# Patient Record
Sex: Male | Born: 1950
Health system: Southern US, Community
[De-identification: ages and names within clinical notes are randomized; demographics above are authoritative.]

## PROBLEM LIST (undated history)

## (undated) DIAGNOSIS — Z923 Personal history of irradiation: Secondary | ICD-10-CM

## (undated) DIAGNOSIS — Z862 Personal history of diseases of the blood and blood-forming organs and certain disorders involving the immune mechanism: Secondary | ICD-10-CM

## (undated) DIAGNOSIS — K08109 Complete loss of teeth, unspecified cause, unspecified class: Secondary | ICD-10-CM

## (undated) DIAGNOSIS — N529 Male erectile dysfunction, unspecified: Secondary | ICD-10-CM

## (undated) DIAGNOSIS — J9690 Respiratory failure, unspecified, unspecified whether with hypoxia or hypercapnia: Secondary | ICD-10-CM

## (undated) DIAGNOSIS — I499 Cardiac arrhythmia, unspecified: Secondary | ICD-10-CM

## (undated) DIAGNOSIS — Z9221 Personal history of antineoplastic chemotherapy: Secondary | ICD-10-CM

## (undated) DIAGNOSIS — N189 Chronic kidney disease, unspecified: Secondary | ICD-10-CM

## (undated) DIAGNOSIS — N401 Enlarged prostate with lower urinary tract symptoms: Secondary | ICD-10-CM

## (undated) DIAGNOSIS — Z7901 Long term (current) use of anticoagulants: Secondary | ICD-10-CM

## (undated) DIAGNOSIS — R7303 Prediabetes: Secondary | ICD-10-CM

## (undated) DIAGNOSIS — I219 Acute myocardial infarction, unspecified: Secondary | ICD-10-CM

## (undated) HISTORY — DX: Respiratory failure, unspecified, unspecified whether with hypoxia or hypercapnia: J96.90

---

## 2019-08-09 ENCOUNTER — Ambulatory Visit (HOSPITAL_COMMUNITY): Admit: 2019-08-09 | Payer: Self-pay | Admitting: Cardiovascular Disease

## 2019-08-09 ENCOUNTER — Encounter (HOSPITAL_COMMUNITY)
Admission: EM | Disposition: A | Discharge status: Home Health | Payer: Self-pay | Source: Home / Self Care | Attending: Cardiovascular Disease

## 2019-08-09 ENCOUNTER — Emergency Department (HOSPITAL_COMMUNITY): Payer: BC Managed Care – PPO

## 2019-08-09 ENCOUNTER — Inpatient Hospital Stay (HOSPITAL_COMMUNITY)
Admission: EM | Admit: 2019-08-09 | Discharge: 2019-08-19 | DRG: 246 | Disposition: A | Discharge status: Home Health | Payer: BC Managed Care – PPO | Attending: Cardiovascular Disease | Admitting: Cardiovascular Disease

## 2019-08-09 DIAGNOSIS — I471 Supraventricular tachycardia: Secondary | ICD-10-CM | POA: Diagnosis not present

## 2019-08-09 DIAGNOSIS — R402112 Coma scale, eyes open, never, at arrival to emergency department: Secondary | ICD-10-CM | POA: Diagnosis not present

## 2019-08-09 DIAGNOSIS — J9601 Acute respiratory failure with hypoxia: Secondary | ICD-10-CM | POA: Diagnosis not present

## 2019-08-09 DIAGNOSIS — K5790 Diverticulosis of intestine, part unspecified, without perforation or abscess without bleeding: Secondary | ICD-10-CM | POA: Diagnosis not present

## 2019-08-09 DIAGNOSIS — R402212 Coma scale, best verbal response, none, at arrival to emergency department: Secondary | ICD-10-CM | POA: Diagnosis not present

## 2019-08-09 DIAGNOSIS — G40901 Epilepsy, unspecified, not intractable, with status epilepticus: Secondary | ICD-10-CM | POA: Diagnosis not present

## 2019-08-09 DIAGNOSIS — I2510 Atherosclerotic heart disease of native coronary artery without angina pectoris: Secondary | ICD-10-CM

## 2019-08-09 DIAGNOSIS — I4901 Ventricular fibrillation: Secondary | ICD-10-CM | POA: Diagnosis not present

## 2019-08-09 DIAGNOSIS — J9690 Respiratory failure, unspecified, unspecified whether with hypoxia or hypercapnia: Secondary | ICD-10-CM | POA: Diagnosis not present

## 2019-08-09 DIAGNOSIS — I4510 Unspecified right bundle-branch block: Secondary | ICD-10-CM | POA: Diagnosis not present

## 2019-08-09 DIAGNOSIS — J690 Pneumonitis due to inhalation of food and vomit: Secondary | ICD-10-CM | POA: Diagnosis not present

## 2019-08-09 DIAGNOSIS — R569 Unspecified convulsions: Secondary | ICD-10-CM | POA: Diagnosis present

## 2019-08-09 DIAGNOSIS — Z20822 Contact with and (suspected) exposure to covid-19: Secondary | ICD-10-CM | POA: Diagnosis not present

## 2019-08-09 DIAGNOSIS — R402312 Coma scale, best motor response, none, at arrival to emergency department: Secondary | ICD-10-CM | POA: Diagnosis not present

## 2019-08-09 DIAGNOSIS — I25110 Atherosclerotic heart disease of native coronary artery with unstable angina pectoris: Secondary | ICD-10-CM | POA: Diagnosis not present

## 2019-08-09 DIAGNOSIS — R310 Gross hematuria: Secondary | ICD-10-CM

## 2019-08-09 DIAGNOSIS — R1110 Vomiting, unspecified: Secondary | ICD-10-CM | POA: Diagnosis not present

## 2019-08-09 DIAGNOSIS — R0683 Snoring: Secondary | ICD-10-CM | POA: Diagnosis not present

## 2019-08-09 DIAGNOSIS — Z6827 Body mass index (BMI) 27.0-27.9, adult: Secondary | ICD-10-CM | POA: Diagnosis not present

## 2019-08-09 DIAGNOSIS — R001 Bradycardia, unspecified: Secondary | ICD-10-CM | POA: Diagnosis not present

## 2019-08-09 DIAGNOSIS — R0602 Shortness of breath: Secondary | ICD-10-CM | POA: Diagnosis not present

## 2019-08-09 DIAGNOSIS — I469 Cardiac arrest, cause unspecified: Secondary | ICD-10-CM | POA: Diagnosis not present

## 2019-08-09 DIAGNOSIS — N3021 Other chronic cystitis with hematuria: Secondary | ICD-10-CM | POA: Diagnosis not present

## 2019-08-09 DIAGNOSIS — I462 Cardiac arrest due to underlying cardiac condition: Secondary | ICD-10-CM | POA: Diagnosis present

## 2019-08-09 DIAGNOSIS — I25119 Atherosclerotic heart disease of native coronary artery with unspecified angina pectoris: Secondary | ICD-10-CM

## 2019-08-09 DIAGNOSIS — N179 Acute kidney failure, unspecified: Secondary | ICD-10-CM | POA: Diagnosis present

## 2019-08-09 DIAGNOSIS — R570 Cardiogenic shock: Secondary | ICD-10-CM | POA: Diagnosis present

## 2019-08-09 DIAGNOSIS — I509 Heart failure, unspecified: Secondary | ICD-10-CM

## 2019-08-09 DIAGNOSIS — Z9861 Coronary angioplasty status: Secondary | ICD-10-CM

## 2019-08-09 DIAGNOSIS — R338 Other retention of urine: Secondary | ICD-10-CM | POA: Diagnosis not present

## 2019-08-09 DIAGNOSIS — F17210 Nicotine dependence, cigarettes, uncomplicated: Secondary | ICD-10-CM | POA: Diagnosis present

## 2019-08-09 DIAGNOSIS — E663 Overweight: Secondary | ICD-10-CM | POA: Diagnosis not present

## 2019-08-09 DIAGNOSIS — Z03818 Encounter for observation for suspected exposure to other biological agents ruled out: Secondary | ICD-10-CM | POA: Diagnosis not present

## 2019-08-09 DIAGNOSIS — R0989 Other specified symptoms and signs involving the circulatory and respiratory systems: Secondary | ICD-10-CM | POA: Diagnosis not present

## 2019-08-09 DIAGNOSIS — N400 Enlarged prostate without lower urinary tract symptoms: Secondary | ICD-10-CM | POA: Diagnosis not present

## 2019-08-09 DIAGNOSIS — N320 Bladder-neck obstruction: Secondary | ICD-10-CM | POA: Diagnosis present

## 2019-08-09 DIAGNOSIS — I214 Non-ST elevation (NSTEMI) myocardial infarction: Secondary | ICD-10-CM | POA: Diagnosis not present

## 2019-08-09 DIAGNOSIS — I517 Cardiomegaly: Secondary | ICD-10-CM | POA: Diagnosis not present

## 2019-08-09 DIAGNOSIS — R972 Elevated prostate specific antigen [PSA]: Secondary | ICD-10-CM | POA: Diagnosis not present

## 2019-08-09 DIAGNOSIS — F1290 Cannabis use, unspecified, uncomplicated: Secondary | ICD-10-CM | POA: Diagnosis present

## 2019-08-09 DIAGNOSIS — Z8679 Personal history of other diseases of the circulatory system: Secondary | ICD-10-CM

## 2019-08-09 DIAGNOSIS — N3289 Other specified disorders of bladder: Secondary | ICD-10-CM | POA: Diagnosis not present

## 2019-08-09 DIAGNOSIS — R9431 Abnormal electrocardiogram [ECG] [EKG]: Secondary | ICD-10-CM | POA: Diagnosis not present

## 2019-08-09 DIAGNOSIS — Z8249 Family history of ischemic heart disease and other diseases of the circulatory system: Secondary | ICD-10-CM

## 2019-08-09 DIAGNOSIS — Z7901 Long term (current) use of anticoagulants: Secondary | ICD-10-CM | POA: Diagnosis not present

## 2019-08-09 DIAGNOSIS — B9620 Unspecified Escherichia coli [E. coli] as the cause of diseases classified elsewhere: Secondary | ICD-10-CM | POA: Diagnosis present

## 2019-08-09 DIAGNOSIS — I25810 Atherosclerosis of coronary artery bypass graft(s) without angina pectoris: Secondary | ICD-10-CM | POA: Diagnosis not present

## 2019-08-09 DIAGNOSIS — I441 Atrioventricular block, second degree: Secondary | ICD-10-CM | POA: Diagnosis not present

## 2019-08-09 DIAGNOSIS — R739 Hyperglycemia, unspecified: Secondary | ICD-10-CM | POA: Diagnosis present

## 2019-08-09 DIAGNOSIS — Z955 Presence of coronary angioplasty implant and graft: Secondary | ICD-10-CM

## 2019-08-09 DIAGNOSIS — N183 Chronic kidney disease, stage 3 unspecified: Secondary | ICD-10-CM | POA: Diagnosis present

## 2019-08-09 DIAGNOSIS — Z01810 Encounter for preprocedural cardiovascular examination: Secondary | ICD-10-CM | POA: Diagnosis not present

## 2019-08-09 DIAGNOSIS — I25118 Atherosclerotic heart disease of native coronary artery with other forms of angina pectoris: Secondary | ICD-10-CM

## 2019-08-09 DIAGNOSIS — N401 Enlarged prostate with lower urinary tract symptoms: Secondary | ICD-10-CM | POA: Diagnosis present

## 2019-08-09 DIAGNOSIS — J811 Chronic pulmonary edema: Secondary | ICD-10-CM | POA: Diagnosis not present

## 2019-08-09 DIAGNOSIS — Z01818 Encounter for other preprocedural examination: Secondary | ICD-10-CM | POA: Diagnosis not present

## 2019-08-09 DIAGNOSIS — Z8674 Personal history of sudden cardiac arrest: Secondary | ICD-10-CM

## 2019-08-09 DIAGNOSIS — R404 Transient alteration of awareness: Secondary | ICD-10-CM | POA: Diagnosis not present

## 2019-08-09 DIAGNOSIS — R319 Hematuria, unspecified: Secondary | ICD-10-CM | POA: Diagnosis not present

## 2019-08-09 DIAGNOSIS — Z4659 Encounter for fitting and adjustment of other gastrointestinal appliance and device: Secondary | ICD-10-CM

## 2019-08-09 DIAGNOSIS — I252 Old myocardial infarction: Secondary | ICD-10-CM

## 2019-08-09 HISTORY — DX: Cardiac arrest, cause unspecified: I46.9

## 2019-08-09 HISTORY — DX: Personal history of other diseases of the circulatory system: Z86.79

## 2019-08-09 HISTORY — PX: CORONARY STENT INTERVENTION: CATH118234

## 2019-08-09 HISTORY — DX: Ventricular fibrillation: I49.01

## 2019-08-09 HISTORY — DX: Old myocardial infarction: I25.2

## 2019-08-09 HISTORY — PX: LEFT HEART CATH AND CORONARY ANGIOGRAPHY: CATH118249

## 2019-08-09 HISTORY — DX: Personal history of sudden cardiac arrest: Z86.74

## 2019-08-09 HISTORY — DX: Presence of coronary angioplasty implant and graft: Z95.5

## 2019-08-09 LAB — COMPREHENSIVE METABOLIC PANEL
ALT: 71 U/L — ABNORMAL HIGH (ref 0–44)
AST: 98 U/L — ABNORMAL HIGH (ref 15–41)
Albumin: 2.7 g/dL — ABNORMAL LOW (ref 3.5–5.0)
Alkaline Phosphatase: 74 U/L (ref 38–126)
Anion gap: 20 — ABNORMAL HIGH (ref 5–15)
BUN: 8 mg/dL (ref 8–23)
CO2: 16 mmol/L — ABNORMAL LOW (ref 22–32)
Calcium: 8.7 mg/dL — ABNORMAL LOW (ref 8.9–10.3)
Chloride: 99 mmol/L (ref 98–111)
Creatinine, Ser: 1.25 mg/dL — ABNORMAL HIGH (ref 0.61–1.24)
GFR calc Af Amer: >60 mL/min (ref >60)
GFR calc non Af Amer: 59 mL/min — ABNORMAL LOW (ref >60)
Glucose, Bld: 301 mg/dL — ABNORMAL HIGH (ref 70–99)
Potassium: 3.8 mmol/L (ref 3.5–5.1)
Sodium: 135 mmol/L (ref 135–145)
Total Bilirubin: 0.3 mg/dL (ref 0.3–1.2)
Total Protein: 6.3 g/dL — ABNORMAL LOW (ref 6.5–8.1)

## 2019-08-09 LAB — CBC WITH DIFFERENTIAL/PLATELET
Abs Immature Granulocytes: 0 10*3/uL (ref 0.00–0.07)
Basophils Absolute: 0.1 10*3/uL (ref 0.0–0.1)
Basophils Relative: 1 %
Eosinophils Absolute: 0.4 10*3/uL (ref 0.0–0.5)
Eosinophils Relative: 3 %
HCT: 48.3 % (ref 39.0–52.0)
Hemoglobin: 14.5 g/dL (ref 13.0–17.0)
Lymphocytes Relative: 43 %
Lymphs Abs: 6 10*3/uL — ABNORMAL HIGH (ref 0.7–4.0)
MCH: 25.2 pg — ABNORMAL LOW (ref 26.0–34.0)
MCHC: 30 g/dL (ref 30.0–36.0)
MCV: 83.9 fL (ref 80.0–100.0)
Monocytes Absolute: 0.6 10*3/uL (ref 0.1–1.0)
Monocytes Relative: 4 %
Neutro Abs: 6.8 10*3/uL (ref 1.7–7.7)
Neutrophils Relative %: 49 %
Platelets: 289 10*3/uL (ref 150–400)
RBC: 5.76 MIL/uL (ref 4.22–5.81)
RDW: 17.1 % — ABNORMAL HIGH (ref 11.5–15.5)
WBC: 13.9 10*3/uL — ABNORMAL HIGH (ref 4.0–10.5)
nRBC: 0 /100 WBC
nRBC: 0.1 % (ref 0.0–0.2)

## 2019-08-09 LAB — HEMOGLOBIN A1C
Hgb A1c MFr Bld: 6.3 % — ABNORMAL HIGH (ref 4.8–5.6)
Mean Plasma Glucose: 134.11 mg/dL

## 2019-08-09 LAB — LIPID PANEL
Cholesterol: 127 mg/dL (ref 0–200)
HDL: 31 mg/dL — ABNORMAL LOW (ref >40)
LDL Cholesterol: 78 mg/dL (ref 0–99)
Total CHOL/HDL Ratio: 4.1 RATIO
Triglycerides: 91 mg/dL (ref <150)
VLDL: 18 mg/dL (ref 0–40)

## 2019-08-09 LAB — COOXEMETRY PANEL
Carboxyhemoglobin: 1.5 % (ref 0.5–1.5)
Methemoglobin: 0.9 % (ref 0.0–1.5)
O2 Saturation: 58.9 %
Total hemoglobin: 12.9 g/dL (ref 12.0–16.0)

## 2019-08-09 LAB — TROPONIN I (HIGH SENSITIVITY)
Troponin I (High Sensitivity): 75 ng/L — ABNORMAL HIGH (ref <18)
Troponin I (High Sensitivity): 8006 ng/L (ref <18)

## 2019-08-09 LAB — APTT: aPTT: 38 seconds — ABNORMAL HIGH (ref 24–36)

## 2019-08-09 LAB — RESPIRATORY PANEL BY RT PCR (FLU A&B, COVID)
Influenza A by PCR: NEGATIVE
Influenza B by PCR: NEGATIVE
SARS Coronavirus 2 by RT PCR: NEGATIVE

## 2019-08-09 LAB — PROTIME-INR
INR: 1.3 — ABNORMAL HIGH (ref 0.8–1.2)
Prothrombin Time: 16 seconds — ABNORMAL HIGH (ref 11.4–15.2)

## 2019-08-09 LAB — GLUCOSE, CAPILLARY: Glucose-Capillary: 144 mg/dL — ABNORMAL HIGH (ref 70–99)

## 2019-08-09 LAB — TRIGLYCERIDES: Triglycerides: 112 mg/dL (ref <150)

## 2019-08-09 SURGERY — LEFT HEART CATH AND CORONARY ANGIOGRAPHY
Anesthesia: LOCAL

## 2019-08-09 MED ORDER — LABETALOL HCL 5 MG/ML IV SOLN: 10.0000 mg | INTRAVENOUS | Status: AC | PRN

## 2019-08-09 MED ORDER — ETOMIDATE 2 MG/ML IV SOLN
INTRAVENOUS | Status: AC | PRN
  Administered 2019-08-09: 30 mg via INTRAVENOUS

## 2019-08-09 MED ORDER — HEPARIN (PORCINE) IN NACL 1000-0.9 UT/500ML-% IV SOLN
INTRAVENOUS | Status: AC
  Filled 2019-08-09: qty 1000

## 2019-08-09 MED ORDER — SODIUM CHLORIDE 0.9 % IV SOLN
INTRAVENOUS | Status: AC | PRN
  Administered 2019-08-09: 10 mL/h via INTRAVENOUS

## 2019-08-09 MED ORDER — SODIUM CHLORIDE 0.9 % IV SOLN
INTRAVENOUS | Status: AC | PRN
  Administered 2019-08-09: 4 ug/kg/min via INTRAVENOUS

## 2019-08-09 MED ORDER — SODIUM CHLORIDE 0.9 % IV SOLN: INTRAVENOUS | Status: AC

## 2019-08-09 MED ORDER — TICAGRELOR 90 MG PO TABS
180.0000 mg | ORAL_TABLET | Freq: Once | ORAL | Status: AC
  Administered 2019-08-10: 180 mg
  Filled 2019-08-09: qty 2

## 2019-08-09 MED ORDER — SODIUM CHLORIDE 0.9 % IV SOLN
3.0000 g | Freq: Four times a day (QID) | INTRAVENOUS | Status: DC
  Administered 2019-08-09 – 2019-08-11 (×6): 3 g via INTRAVENOUS
  Filled 2019-08-09: qty 8
  Filled 2019-08-09 (×2): qty 3
  Filled 2019-08-09 (×2): qty 8
  Filled 2019-08-09 (×2): qty 3
  Filled 2019-08-09 (×2): qty 8

## 2019-08-09 MED ORDER — TICAGRELOR 90 MG PO TABS
90.0000 mg | ORAL_TABLET | Freq: Two times a day (BID) | ORAL | Status: DC
  Administered 2019-08-10 – 2019-08-19 (×19): 90 mg via ORAL
  Filled 2019-08-09 (×19): qty 1

## 2019-08-09 MED ORDER — FENTANYL CITRATE (PF) 100 MCG/2ML IJ SOLN
25.0000 ug | INTRAMUSCULAR | Status: DC | PRN
  Administered 2019-08-09: 100 ug via INTRAVENOUS
  Filled 2019-08-09 (×2): qty 2

## 2019-08-09 MED ORDER — INSULIN ASPART 100 UNIT/ML ~~LOC~~ SOLN
0.0000 [IU] | SUBCUTANEOUS | Status: DC
  Administered 2019-08-09 – 2019-08-10 (×3): 2 [IU] via SUBCUTANEOUS

## 2019-08-09 MED ORDER — NOREPINEPHRINE BITARTRATE 1 MG/ML IV SOLN
INTRAVENOUS | Status: AC | PRN
  Administered 2019-08-09: 5 ug/min via INTRAVENOUS

## 2019-08-09 MED ORDER — BIVALIRUDIN BOLUS VIA INFUSION - CUPID
INTRAVENOUS | Status: DC | PRN
  Administered 2019-08-09: 88.425 mg via INTRAVENOUS

## 2019-08-09 MED ORDER — BIVALIRUDIN TRIFLUOROACETATE 250 MG IV SOLR
INTRAVENOUS | Status: AC
  Filled 2019-08-09: qty 250

## 2019-08-09 MED ORDER — SODIUM CHLORIDE 0.9 % IV SOLN
INTRAVENOUS | Status: DC | PRN
  Administered 2019-08-09 (×2): 1.75 mg/kg/h via INTRAVENOUS

## 2019-08-09 MED ORDER — SODIUM CHLORIDE 0.9 % IV SOLN
4.0000 ug/kg/min | INTRAVENOUS | Status: AC
  Administered 2019-08-09: 4 ug/kg/min via INTRAVENOUS
  Filled 2019-08-09: qty 50

## 2019-08-09 MED ORDER — CANGRELOR BOLUS VIA INFUSION
30.0000 ug/kg | Freq: Once | INTRAVENOUS | Status: DC
  Filled 2019-08-09: qty 3537

## 2019-08-09 MED ORDER — SODIUM CHLORIDE 0.9% FLUSH
3.0000 mL | Freq: Two times a day (BID) | INTRAVENOUS | Status: DC
  Administered 2019-08-10 – 2019-08-19 (×18): 3 mL via INTRAVENOUS

## 2019-08-09 MED ORDER — IOHEXOL 350 MG/ML SOLN
INTRAVENOUS | Status: AC
  Filled 2019-08-09: qty 1

## 2019-08-09 MED ORDER — SODIUM CHLORIDE 0.9 % IV SOLN
INTRAVENOUS | Status: AC | PRN
  Administered 2019-08-09: 250 mL via INTRAVENOUS

## 2019-08-09 MED ORDER — ATORVASTATIN CALCIUM 80 MG PO TABS
80.0000 mg | ORAL_TABLET | Freq: Every day | ORAL | Status: DC
  Administered 2019-08-10 – 2019-08-18 (×9): 80 mg via ORAL
  Filled 2019-08-09: qty 1
  Filled 2019-08-09: qty 2
  Filled 2019-08-09 (×7): qty 1
  Filled 2019-08-09: qty 2

## 2019-08-09 MED ORDER — HEPARIN SODIUM (PORCINE) 5000 UNIT/ML IJ SOLN
60.0000 [IU]/kg | Freq: Once | INTRAMUSCULAR | Status: AC
  Administered 2019-08-09: 4000 [IU] via INTRAVENOUS

## 2019-08-09 MED ORDER — ONDANSETRON HCL 4 MG/2ML IJ SOLN: 4.0000 mg | Freq: Four times a day (QID) | INTRAMUSCULAR | Status: DC | PRN

## 2019-08-09 MED ORDER — NITROGLYCERIN 1 MG/10 ML FOR IR/CATH LAB
INTRA_ARTERIAL | Status: DC | PRN
  Administered 2019-08-09: 200 ug via INTRACORONARY

## 2019-08-09 MED ORDER — ACETAMINOPHEN 325 MG PO TABS
650.0000 mg | ORAL_TABLET | ORAL | Status: DC | PRN
  Administered 2019-08-10 – 2019-08-19 (×15): 650 mg via ORAL
  Filled 2019-08-09 (×15): qty 2

## 2019-08-09 MED ORDER — LIDOCAINE HCL (PF) 1 % IJ SOLN
INTRAMUSCULAR | Status: DC | PRN
  Administered 2019-08-09: 10 mL
  Administered 2019-08-09: 20 mL via SUBCUTANEOUS

## 2019-08-09 MED ORDER — PANTOPRAZOLE SODIUM 40 MG IV SOLR
40.0000 mg | Freq: Every day | INTRAVENOUS | Status: DC
  Administered 2019-08-09 – 2019-08-10 (×2): 40 mg via INTRAVENOUS
  Filled 2019-08-09 (×2): qty 40

## 2019-08-09 MED ORDER — LIDOCAINE HCL (PF) 1 % IJ SOLN
INTRAMUSCULAR | Status: AC
  Filled 2019-08-09: qty 30

## 2019-08-09 MED ORDER — NOREPINEPHRINE 4 MG/250ML-% IV SOLN
0.0000 ug/min | INTRAVENOUS | Status: DC
  Administered 2019-08-09: 5 ug/min via INTRAVENOUS
  Administered 2019-08-10: 13 ug/min via INTRAVENOUS
  Filled 2019-08-09: qty 250

## 2019-08-09 MED ORDER — CANGRELOR TETRASODIUM 50 MG IV SOLR
INTRAVENOUS | Status: AC
  Filled 2019-08-09: qty 50

## 2019-08-09 MED ORDER — PROPOFOL 1000 MG/100ML IV EMUL
0.0000 ug/kg/min | INTRAVENOUS | Status: DC
  Administered 2019-08-09: 30 ug/kg/min via INTRAVENOUS
  Administered 2019-08-09 – 2019-08-10 (×3): 25 ug/kg/min via INTRAVENOUS
  Filled 2019-08-09: qty 100
  Filled 2019-08-09: qty 200
  Filled 2019-08-09: qty 100

## 2019-08-09 MED ORDER — LACTATED RINGERS IV SOLN: INTRAVENOUS | Status: DC

## 2019-08-09 MED ORDER — PROPOFOL 1000 MG/100ML IV EMUL
INTRAVENOUS | Status: AC | PRN
  Administered 2019-08-09: 20 ug/kg/min via INTRAVENOUS

## 2019-08-09 MED ORDER — NOREPINEPHRINE BITARTRATE 1 MG/ML IV SOLN
INTRAVENOUS | Status: AC | PRN
  Administered 2019-08-09: 10 ug via INTRAVENOUS

## 2019-08-09 MED ORDER — SODIUM CHLORIDE 0.9% FLUSH
3.0000 mL | INTRAVENOUS | Status: DC | PRN
  Administered 2019-08-17 – 2019-08-18 (×3): 3 mL via INTRAVENOUS

## 2019-08-09 MED ORDER — ASPIRIN 81 MG PO CHEW
81.0000 mg | CHEWABLE_TABLET | Freq: Every day | ORAL | Status: DC
  Administered 2019-08-10 – 2019-08-19 (×10): 81 mg via ORAL
  Filled 2019-08-09 (×10): qty 1

## 2019-08-09 MED ORDER — FENTANYL CITRATE (PF) 100 MCG/2ML IJ SOLN: 25.0000 ug | INTRAMUSCULAR | Status: DC | PRN

## 2019-08-09 MED ORDER — SODIUM CHLORIDE 0.9 % IV SOLN: INTRAVENOUS | Status: DC

## 2019-08-09 MED ORDER — HYDRALAZINE HCL 20 MG/ML IJ SOLN: 10.0000 mg | INTRAMUSCULAR | Status: AC | PRN

## 2019-08-09 MED ORDER — IOHEXOL 350 MG/ML SOLN
INTRAVENOUS | Status: DC | PRN
  Administered 2019-08-09: 200 mL via INTRA_ARTERIAL

## 2019-08-09 MED ORDER — ASPIRIN 81 MG PO CHEW: 81.0000 mg | CHEWABLE_TABLET | Freq: Every day | ORAL | Status: DC

## 2019-08-09 MED ORDER — ROCURONIUM BROMIDE 50 MG/5ML IV SOLN
INTRAVENOUS | Status: AC | PRN
  Administered 2019-08-09: 100 mg via INTRAVENOUS

## 2019-08-09 MED ORDER — NITROGLYCERIN 1 MG/10 ML FOR IR/CATH LAB
INTRA_ARTERIAL | Status: AC
  Filled 2019-08-09: qty 10

## 2019-08-09 MED ORDER — CANGRELOR BOLUS VIA INFUSION
INTRAVENOUS | Status: DC | PRN
  Administered 2019-08-09: 3537 ug via INTRAVENOUS

## 2019-08-09 MED ORDER — SODIUM CHLORIDE 0.9 % IV SOLN: 250.0000 mL | INTRAVENOUS | Status: DC | PRN

## 2019-08-09 MED ORDER — ASPIRIN 300 MG RE SUPP
300.0000 mg | Freq: Once | RECTAL | Status: AC
  Administered 2019-08-09: 300 mg via RECTAL

## 2019-08-09 SURGICAL SUPPLY — 23 items
BALLN SAPPHIRE 2.0X12 (BALLOONS) ×2
BALLN SAPPHIRE ~~LOC~~ 2.75X12 (BALLOONS) ×1 IMPLANT
BALLN SAPPHIRE ~~LOC~~ 4.0X15 (BALLOONS) ×1 IMPLANT
BALLOON SAPPHIRE 2.0X12 (BALLOONS) IMPLANT
CATH DXT MULTI JL4 JR4 ANG PIG (CATHETERS) ×1 IMPLANT
CATH LAUNCHER 6FR JR4 (CATHETERS) ×1 IMPLANT
CATH SWAN GANZ VIP 7.5F (CATHETERS) ×1 IMPLANT
CATH VISTA GUIDE 6FR XB3.5 (CATHETERS) ×1 IMPLANT
KIT ENCORE 26 ADVANTAGE (KITS) ×1 IMPLANT
KIT HEART LEFT (KITS) ×2 IMPLANT
PACK CARDIAC CATHETERIZATION (CUSTOM PROCEDURE TRAY) ×2 IMPLANT
SHEATH PINNACLE 6F 10CM (SHEATH) ×1 IMPLANT
SHEATH PINNACLE 8F 10CM (SHEATH) ×1 IMPLANT
SLEEVE REPOSITIONING LENGTH 30 (MISCELLANEOUS) ×1 IMPLANT
STENT SYNERGY XD 2.25X28 (Permanent Stent) IMPLANT
STENT SYNERGY XD 3.50X24 (Permanent Stent) IMPLANT
SYNERGY XD 2.25X28 (Permanent Stent) ×2 IMPLANT
SYNERGY XD 3.50X24 (Permanent Stent) ×2 IMPLANT
TRANSDUCER W/STOPCOCK (MISCELLANEOUS) ×2 IMPLANT
TUBING CIL FLEX 10 FLL-RA (TUBING) ×2 IMPLANT
WIRE ASAHI PROWATER 180CM (WIRE) ×2 IMPLANT
WIRE EMERALD 3MM-J .025X260CM (WIRE) ×1 IMPLANT
WIRE EMERALD 3MM-J .035X150CM (WIRE) ×1 IMPLANT

## 2019-08-09 NOTE — H&P (Signed)
NAME:  John Love, MRN:  [id number], DOB:  Jun 27, 1950, LOS: 0 ADMISSION DATE:  08/09/2019, CONSULTATION DATE:  08/09/19 REFERRING MD:  Ellyn Hack  CHIEF COMPLAINT:  Cardiac Arrest   Brief History   John Love is a 69 y.o. male who was admitted 2/23 after V.fib arrest following his 2nd COVID vaccine.  History of present illness   Pt is encephelopathic; therefore, this HPI is obtained from chart review. John Love is a 69 y.o. male who has unknown PMH.  He presented to [facility] ED 2/23 with V.fib arrest.  He had received his 2nd COVID vaccine and during his post vaccine 15 minute observation period, he was noted to have seizure like activity before becoming unresponsive. CPR was started and upon AED placement, shock was advised for V.fib.  He required 6 shocks and 450 amio before ROSC.  Total downtime was 20 minutes.  He was started on an epi infusion and transported to ED.  In ED, it was initially felt that he did have purposeful movement.  King airway was replaced with ETT and there was evidence of aspiration.    He was found to have abnormal EKG; therefore, he was taken to cath lab.  In cath lab, he was found to have prox RCA lesion80% stenosed, Ost RCA to prox RCA lesion 50% stenosed, 1st diag lesion 95% stenosed, prox LAD to mid LAD lesion 75% stenosed, mid Cx to dist Cx lesion 80% stenosed.  He had a stent placed to RCA and Cx.  PCCM asked to admit to ICU.  Upon arrival to ICU, he is awake on vent despite propofol and is able to follow all basic commands.  Past Medical History  has Cardiac arrest with ventricular fibrillation ([place]); Sustained ventricular fibrillation ([place]); and Cardiogenic shock (HCC)-borderline on their problem list.  [facility]   2/23 > admit.  Consults:  Cardiology.  Procedures:  ETT 2/23 >  R fem swan 2/23 >    Significant Diagnostic Tests:  CXR 2/23 > vascular congestion. Cath 2/23 > prox RCA lesion80% stenosed, Ost RCA to prox RCA lesion  50% stenosed, 1st diag lesion 95% stenosed, prox LAD to mid LAD lesion 75% stenosed, mid Cx to dist Cx lesion 80% stenosed.  He had a stent placed to RCA and Cx. Echo 2/24 >   Micro Data:  Flu 2/23 > neg. COVID 2/23 > neg.  Antimicrobials:  Unasyn 2/23 >    Interim history/subjective:  Following commands on vent.  Objective:  Blood pressure 96/69, pulse 86, temperature (!) 97.1 F (36.2 C), resp. rate (!) 25, height 6' (1.829 m), weight 117.9 kg, SpO2 100 %.    Vent Mode: PRVC FiO2 (%):  [60 %-100 %] 60 % Set Rate:  [18 bmp-24 bmp] 24 bmp Vt Set:  [620 mL] 620 mL PEEP:  [5 cmH20] 5 cmH20 Plateau Pressure:  [18 cmH20-27 cmH20] 27 cmH20   Intake/Output Summary (Last 24 hours) at 08/09/2019 2134 Last data filed at 08/09/2019 1835 Gross per 24 hour  Intake --  Output 700 ml  Net -700 ml   Filed Weights   08/09/19 1814  Weight: 117.9 kg    Examination: General: Adult male, in NAD. Neuro: Awake on vent.  Following commands. HEENT: [place]/AT. Sclerae anicteric.  ETT in place. Cardiovascular: RRR, no M/R/G.  Lungs: Respirations even and unlabored.  CTA bilaterally, No W/R/R.  Abdomen: BS x 4, soft, NT/ND.  Musculoskeletal: No gross deformities, no edema.  Skin: Intact, warm, no rashes.  Assessment &  Plan:   V.Fib arrest - presumed due to underlying RCA and Cx lesions then exacerbated by possible hypotension post COVID shot.  S/p cardiac cath 2/23 with stents placed to RCA and Cx. - Defer TTM as pt is following commands. - Continue levophed PRN, goal MAP > 65. - Assess echo. - Cardiology following.  Respiratory insufficiency - in the setting of above.  - Full vent support. - Wean as able. - SBT in AM with probable extubation. - VAP prevention measures. - CXR in AM.  Probable aspiration. - Empiric unasyn. - Follow cultures.  AKI - LR @ 75. - Follow BMP.   Hyperglycemia. - SSI.   Best Practice:  Diet: NPO. Pain/Anxiety/Delirium protocol (if indicated):  Propofol gtt / Fentanyl PRN.  RASS goal 0 to -1. VAP protocol (if indicated): In place. DVT prophylaxis: SCD's. GI prophylaxis: PPI. Glucose control: SSI. Mobility: Bedrest. Code Status: Full. Family Communication: Wife updated at bedside. Disposition: ICU.  Labs   CBC: Recent Labs  Lab 08/09/19 1807  WBC 13.9*  NEUTROABS 6.8  HGB 14.5  HCT 48.3  MCV 83.9  PLT 803   Basic Metabolic Panel: Recent Labs  Lab 08/09/19 1807  NA 135  K 3.8  CL 99  CO2 16*  GLUCOSE 301*  BUN 8  CREATININE 1.25*  CALCIUM 8.7*   GFR: Estimated Creatinine Clearance: 76 mL/min (A) (by C-G formula based on SCr of 1.25 mg/dL (H)). Recent Labs  Lab 08/09/19 1807  WBC 13.9*   Liver Function Tests: Recent Labs  Lab 08/09/19 1807  AST 98*  ALT 71*  ALKPHOS 74  BILITOT 0.3  PROT 6.3*  ALBUMIN 2.7*   No results for input(s): LIPASE, AMYLASE in the last 168 hours. No results for input(s): AMMONIA in the last 168 hours. ABG No results found for: PHART, PCO2ART, PO2ART, HCO3, TCO2, ACIDBASEDEF, O2SAT  Coagulation Profile: Recent Labs  Lab 08/09/19 1807  INR 1.3*   Cardiac Enzymes: No results for input(s): CKTOTAL, CKMB, CKMBINDEX, TROPONINI in the last 168 hours. HbA1C: Hgb A1c MFr Bld  Date/Time Value Ref Range Status  08/09/2019 06:07 PM 6.3 (H) 4.8 - 5.6 % Final    Comment:    (NOTE) Pre diabetes:          5.7%-6.4% Diabetes:              >6.4% Glycemic control for   <7.0% adults with diabetes    CBG: No results for input(s): GLUCAP in the last 168 hours.  Review of Systems:   Unable to obtain as pt is encephalopathic.  Past medical history  He,  has no past medical history on file.   Surgical History   Unknown.  Social History      Family history   His family history is not on file.   Allergies Not on File   Home meds  Prior to Admission medications   Not on File    Critical care time: 45 min.    Montey Hora, [facility] 08/09/2019, 9:34 PM

## 2019-08-09 NOTE — ED Provider Notes (Signed)
[facility] EMERGENCY DEPARTMENT Provider Note   CSN: [id number] Arrival date & time: 08/09/19  1801     History Chief Complaint  Patient presents with  . Cardiac Arrest    John Love is a 69 y.o. male.  69 year old male who presents after having a witnessed cardiac arrest after having his Covid vaccine. Patient with possible seizure activity and EMS was there when this happened. Was placed on a monitor and was found to be in V. fib. He was cardioverted and ACLS protocol was started. Patient was given a total of 4050 mg of amiodarone as well as epinephrine and placed on epi drip. Patient had ROSC for approximately 20 minutes prior to arrival on epi drip 4 mcg. Patient was breathing on his own and grabbing at the tube. He was intubated with a King airway prior to arrival        No past medical history on file.  There are no problems to display for this patient.        No family history on file.  Social History   Tobacco Use  . Smoking status: Not on file  Substance Use Topics  . Alcohol use: Not on file  . Drug use: Not on file    Home Medications Prior to Admission medications   Not on File    Allergies    Patient has no allergy information on record.  Review of Systems   Review of Systems  All other systems reviewed and are negative.   Physical Exam Updated Vital Signs BP 117/73   Pulse (!) 104   Resp 18   Ht 1.829 m (6')   Wt 117.9 kg   SpO2 99%   BMI 35.26 kg/m   Physical Exam Vitals and nursing note reviewed.  Constitutional:      General: He is not in acute distress.    Appearance: Normal appearance. He is well-developed.     Interventions: He is intubated.  HENT:     Head: Normocephalic and atraumatic.  Eyes:     General: Lids are normal.     Conjunctiva/sclera: Conjunctivae normal.     Pupils: Pupils are equal, round, and reactive to light.  Neck:     Thyroid: No thyroid mass.     Trachea: No tracheal  deviation.  Cardiovascular:     Rate and Rhythm: Normal rate and regular rhythm.     Heart sounds: Normal heart sounds. No murmur. No gallop.   Pulmonary:     Effort: Pulmonary effort is normal. No respiratory distress. He is intubated.     Breath sounds: Normal breath sounds. No stridor. No decreased breath sounds, wheezing, rhonchi or rales.  Abdominal:     General: Bowel sounds are normal. There is no distension.     Palpations: Abdomen is soft.     Tenderness: There is no abdominal tenderness. There is no rebound.  Musculoskeletal:        General: No tenderness. Normal range of motion.     Cervical back: Normal range of motion and neck supple.  Skin:    General: Skin is warm and dry.     Findings: No abrasion or rash.  Neurological:     Mental Status: He is unresponsive.     GCS: GCS eye subscore is 1. GCS verbal subscore is 1. GCS motor subscore is 1.     Cranial Nerves: No cranial nerve deficit.     Sensory: No sensory deficit.  Psychiatric:  Attention and Perception: He is inattentive.     ED Results / Procedures / Treatments   Labs (all labs ordered are listed, but only abnormal results are displayed) Labs Reviewed  RESPIRATORY PANEL BY RT PCR (FLU A&B, COVID)  HEMOGLOBIN A1C  CBC WITH DIFFERENTIAL/PLATELET  PROTIME-INR  APTT  COMPREHENSIVE METABOLIC PANEL  LIPID PANEL  TROPONIN I (HIGH SENSITIVITY)    EKG None  Radiology No results found.  Procedures Procedure Name: Intubation Date/Time: 08/09/2019 6:20 PM Performed by: Lacretia Leigh, MD Pre-anesthesia Checklist: Patient identified, Patient being monitored, Emergency Drugs available, Timeout performed and Suction available Oxygen Delivery Method: Non-rebreather mask Preoxygenation: Pre-oxygenation with 100% oxygen Induction Type: Rapid sequence Ventilation: Mask ventilation without difficulty Laryngoscope Size: Glidescope and 2 Grade View: Grade I Number of attempts: 1 Airway Equipment and  Method: Patient positioned with wedge pillow and Video-laryngoscopy Placement Confirmation: ETT inserted through vocal cords under direct vision,  CO2 detector and Breath sounds checked- equal and bilateral Secured at: 24 cm Tube secured with: ETT holder Dental Injury: Teeth and Oropharynx as per pre-operative assessment  Difficulty Due To: Difficulty was anticipated      (including critical care time)  Medications Ordered in ED Medications  0.9 %  sodium chloride infusion (has no administration in time range)  etomidate (AMIDATE) injection (30 mg Intravenous Given 08/09/19 1801)  rocuronium (ZEMURON) injection (100 mg Intravenous Given 08/09/19 1801)  norepinephrine (LEVOPHED) injection (10 mcg Intravenous Given 08/09/19 1812)  heparin injection 60 Units/kg (4,000 Units Intravenous Given 08/09/19 1812)  aspirin suppository 300 mg (300 mg Rectal Given 08/09/19 1815)    ED Course  I have reviewed the triage vital signs and the nursing notes.  Pertinent labs & imaging results that were available during my care of the patient were reviewed by me and considered in my medical decision making (see chart for details).    MDM Rules/Calculators/A&P                      Patient again having emesis around [facility] airway. Was suctioned. Patient intubated as above Patient's epinephrine drip switched over to Levophed. Was given post intubation sedation with propofol. Patient's EKG consistent with inferior lateral ischemia. Code STEMI activated due to postarrest. Discussed with Dr. Alvester Chou from cardiology who will have the cardiology team come and see the patient. Patient heparinized per pharmacy and given rectal aspirin.  CRITICAL CARE Performed by: Leota Jacobsen Total critical care time: 40 minutes Critical care time was exclusive of separately billable procedures and treating other patients. Critical care was necessary to treat or prevent imminent or life-threatening deterioration. Critical care  was time spent personally by me on the following activities: development of treatment plan with patient and/or surrogate as well as nursing, discussions with consultants, evaluation of patient's response to treatment, examination of patient, obtaining history from patient or surrogate, ordering and performing treatments and interventions, ordering and review of laboratory studies, ordering and review of radiographic studies, pulse oximetry and re-evaluation of patient's condition.  Final Clinical Impression(s) / ED Diagnoses Final diagnoses:  None    Rx / DC Orders ED Discharge Orders    None       Lacretia Leigh, MD 08/09/19 [phone]

## 2019-08-09 NOTE — Code Documentation (Signed)
Pt intubated by Dr Zenia Resides, just prior to pt biting at suction catheter, raising out of bed

## 2019-08-09 NOTE — Consult Note (Addendum)
Cardiology Consultation:   Patient ID: John Love; [id number]; [birth year]   Admit date: 08/09/2019 Date of Consult: 08/09/2019  Primary Care Provider: No primary care provider on file. Primary Cardiologist: [name], MD New Primary Electrophysiologist:  None   Patient Profile:   John Love is a 69 y.o. male with unknown medical history who is being seen today for the evaluation of arrest at the request of Dr. Zenia Love.  History of Present Illness:   John Love has no previous medical history in the system.  He was at a vaccine clinic, getting a Covid shot today.  After the Covid vaccine, he was waiting is 15 minutes.  During that time, he began displaying seizure activity.  He collapsed.  His presenting rhythm was V. Fib.--Importantly, he was being monitored by an off-duty paramedic who recognized the seizure-like activity and immediately initiated ACLS identifying V. fib and started CPR.  He was shocked 3 (I 1 report indicated may be 7 shocks) times and given a total of 450 mg of amiodarone by arrival to the hospital.  He had been given epinephrine, and started on an epi drip at 4 mcg/min.  Upon arrival in the emergency room, he was making purposeful movements -> he was attempting to pull out IV lines in his airway.  Dr. Zenia Love, EDP indicated that upon arrival he had been about 20 minutes in normal perfusing rhythm upon arrival and was on 4 mcg of epinephrine infusion which was converted to Levophed.  Levophed was reduced from 10 2.5 mcg/min.    To protect his airway during [facility] airway exchange for ETT, he was given sedation with fentanyl and Versed.  The [facility] airway was removed and he was intubated, but he may have aspirated during that.  He is currently sedated on the vent -unfortunately he is also status post fentanyl Versed and propofol making neurologic exam difficult.  ROSC was achieved prior to arrival in the emergency room.  Information was obtained from staff and  chart notes.  John Love is sedated and intubated.  We have no information on his past medical history, surgical history, meds or allergies. No past medical history on file. ->  Unfortunate no family members available to assist with history, and this is the patient's first hospitalization with this med rec number.   Prior to Admission medications   Not on File    Inpatient Medications: Scheduled Meds: No known home medications  Continuous Infusions: . sodium chloride    . sodium chloride 10 mL/hr (08/09/19 1854)  . sodium chloride 999 mL/hr at 08/09/19 2012  . bivalirudin (ANGIOMAX) infusion 5 mg/mL 1.75 mg/kg/hr (08/09/19 1953)  . cangrelor 50 mg in NS 250 mL 4 mcg/kg/min (08/09/19 1929)  . norepinephrine (LEVOPHED) 4mg  / 2109mL infusion 10 mcg/min (08/09/19 2001)  . [MAR Hold] norepinephrine (LEVOPHED) Adult infusion 2.5 mcg/min (08/09/19 1833)  . propofol (DIPRIVAN) infusion 25 mcg/kg/min (08/09/19 2009)   PRN Meds:   Allergies:   Not on File  Social History: We have no information on his social history, family history  Social History   Socioeconomic History  . Marital status: Not on file    Spouse name: Not on file  . Number of children: Not on file  . Years of education: Not on file  . Highest education level: Not on file  Occupational History  . Not on file  Tobacco Use  . Smoking status: Not on file  Substance and Sexual Activity  . Alcohol use: Not on file  .  Drug use: Not on file  . Sexual activity: Not on file  Other Topics Concern  . Not on file  Social History Narrative  . Not on file   Social Determinants of Health   Financial Resource Strain:   . Difficulty of Paying Living Expenses: Not on file  Food Insecurity:   . Worried About Charity fundraiser in the Last Year: Not on file  . Ran Out of Food in the Last Year: Not on file  Transportation Needs:   . Lack of Transportation (Medical): Not on file  . Lack of Transportation (Non-Medical): Not on  file  Physical Activity:   . Days of Exercise per Week: Not on file  . Minutes of Exercise per Session: Not on file  Stress:   . Feeling of Stress : Not on file  Social Connections:   . Frequency of Communication with Friends and Family: Not on file  . Frequency of Social Gatherings with Friends and Family: Not on file  . Attends Religious Services: Not on file  . Active Member of Clubs or Organizations: Not on file  . Attends Archivist Meetings: Not on file  . Marital Status: Not on file  Intimate Partner Violence:   . Fear of Current or Ex-Partner: Not on file  . Emotionally Abused: Not on file  . Physically Abused: Not on file  . Sexually Abused: Not on file    Family History:   No family history on file. Family Status:  No family status information on file.    ROS:  No information is obtainable due to patient condition.   Patient intubated and sedated  Physical Exam/Data:   Vitals:   08/09/19 1827 08/09/19 1830 08/09/19 1832 08/09/19 1833  BP: 120/78 (!) 151/92 126/83   Pulse: (!) 102 (!) 110 (!) 104 (!) 103  Resp: 18 18 18 18   Temp:    (!) 97.1 F (36.2 C)  SpO2: 100% 100% 100% 100%  Weight:      Height:        Intake/Output Summary (Last 24 hours) at 08/09/2019 2035 Last data filed at 08/09/2019 1835 Gross per 24 hour  Intake --  Output 700 ml  Net -700 ml   Filed Weights   08/09/19 1814  Weight: 117.9 kg   Body mass index is 35.26 kg/m.  General:  Well nourished, well developed, male intubated on the vent; no stigmata of rash or erythema of the skin. HEENT: normal -Per EDP note, airway did not appear to be swollen or erythematous. Lymph: no adenopathy Neck: JVD -elevated but patient is lying flat Endocrine:  No thryomegaly Vascular: No carotid bruits; 4/4 extremity pulses 2+  Cardiac:  normal S1, S2; RRR; no murmur Lungs: Rales and some rhonchi bilaterally, no wheezing, rhonchi or rales  Abd: soft, nontender, no hepatomegaly  Ext: no  edema Musculoskeletal:  No deformities, BUE and BLE strength not tested Skin: warm and dry  Neuro: Intubated and sedated  EKG:  The EKG was personally reviewed and demonstrates: 2/23 ECG is junctional rhythm, accelerated, heart rate 81, lateral ST depression noted, no old available for comparison -> There was roughly 2 mm ST elevations in aVR as well as 3 to 4 mm ST depressions in V4 through V6.   CV studies:   None known   Laboratory Data:   Chemistry Recent Labs  Lab 08/09/19 1807  NA 135  K 3.8  CL 99  CO2 16*  GLUCOSE 301*  BUN  8  CREATININE 1.25*  CALCIUM 8.7*  GFRNONAA 59*  GFRAA >60  ANIONGAP 20*    Lab Results  Component Value Date   ALT 71 (H) 08/09/2019   AST 98 (H) 08/09/2019   ALKPHOS 74 08/09/2019   BILITOT 0.3 08/09/2019   Hematology Recent Labs  Lab 08/09/19 1807  WBC 13.9*  RBC 5.76  HGB 14.5  HCT 48.3  MCV 83.9  MCH 25.2*  MCHC 30.0  RDW 17.1*  PLT 289   Cardiac Enzymes High Sensitivity Troponin:   Recent Labs  Lab 08/09/19 1807  TROPONINIHS 75*      BNPNo results for input(s): BNP, PROBNP in the last 168 hours.  DDimer No results for input(s): DDIMER in the last 168 hours. TSH: No results found for: TSH Lipids: Lab Results  Component Value Date   CHOL 127 08/09/2019   HDL 31 (L) 08/09/2019   LDLCALC 78 08/09/2019   TRIG 91 08/09/2019   CHOLHDL 4.1 08/09/2019   HgbA1c: Lab Results  Component Value Date   HGBA1C 6.3 (H) 08/09/2019   Magnesium: No results found for: MG   Radiology/Studies:  DG Chest Port 1 View  Result Date: 08/09/2019 CLINICAL DATA:  69 year old male status post CPR. EXAM: PORTABLE CHEST 1 VIEW COMPARISON:  None. FINDINGS: Endotracheal tube with tip approximately 6 cm above the carina. Enteric tube extends below the diaphragm with tip beyond the inferior margin of the image. There is cardiomegaly with vascular congestion. No consolidative changes. There is no pleural effusion or pneumothorax. No acute  osseous pathology. IMPRESSION: 1. Endotracheal tube above the carina. 2. Cardiomegaly with vascular congestion. Electronically Signed   By: Anner Crete M.D.   On: 08/09/2019 18:53    Assessment and Plan:   1. VF arrest - continue amio -ECG is abnormal, possible ischemic cause of the V. fib arrest so he is being taken directly to the Cath Lab -Further evaluation and treatment depending on the results -CCM has seen him and will cool him, and manage the vent and treat for possible aspiration pneumonia -If patient has CAD, manage per standard of care with aspirin, beta-blocker, high-dose statin -Check echo  2.  Cardiac arrest -He had just had his Covid shot -There were no reported symptoms such as shortness of breath or swelling of the lips face or tongue reported prior to his arrest -We have no information on allergy history so unclear if he could have had an anaphylactic reaction -Further evaluation per CCM  Principal Problem:   Cardiac arrest with ventricular fibrillation ([place]) Active Problems:   Cardiogenic shock (HCC)-borderline   Sustained ventricular fibrillation ([place])   For questions or updates, please contact [facility] Please consult [email or web address] for contact info under Cardiology/STEMI.   Signed, Rosaria Ferries, PA-C  08/09/2019  7:03 PM    ATTENDING ATTESTATION  I have seen, examined and evaluated the patient this evening in the emergency room along with Rosaria Ferries, PA.  After reviewing all the available data and chart, we discussed the patients laboratory, study & physical findings as well as symptoms in detail. I agree with her findings, examination as well as impression recommendations as per our discussion.    Attending adjustments noted in italics.   Very complicated situation with patient who we do not have any history on who presents essentially as a "John Doe "with exception of knowing his name having had witnessed VF arrest shortly after his  COVID-19 vaccine.  The only indication we have is that he had  seizure-like activity and was found to be in V. fib by the paramedic onsite.  CPR initiated immediately and there was intermittent restoration of ROSC but not sustained until 20 minutes prior to arrival to the ER.  His EKG is very concerning for ischemic findings, however does not meet STEMI criteria.  Persistent prolonged VF arrest in a patient with this EKG is very concerning for ischemic etiology, certainly there could be a reaction to the COVID-19 vaccine, but it also could very well be that potential vaccine related hypotension reaction in the setting of multivessel disease could lead to global ischemia and therefore VF. I discussed with Dr.Berry, and feel very strongly the patient should be taken directly to cardiac catheterization lab for urgent cardiac catheterization and possible PCI.  It would not be considered a STEMI, would be considered post VF arrest.  My concern is that he would have left main disease or multivessel disease as indicated by his EKG.  We have consulted critical care to assist with vent management.  I do presume that he would be a candidate for cooling protocol with [place] sun.  He is already on sedation with propofol.  With concern for aspiration pneumonia, we will dose with Unasyn empirically.  Will likely need chest x-ray upon arrival to the CVICU.  I accompanied the patient up to the cardiac catheterization lab and turned over care to Dr. Quay Burow (on-call INTERVENTIONAL CARDIOLOGIST)   He will need lipid panel, A1c, TSH and all the appropriate orders for labs. He is currently on Levophed, so no plans for beta-blocker at present, however would like to get him on something relatively soon.  He did get 450 mg of amiodarone providing some coverage.  If he has recurrent arrhythmia may consider IV amiodarone infusion.  Anticipate echocardiogram tomorrow.   [name], M.D., M.S. Interventional  Cardiologist   Pager # [phone] Phone # [phone] [address]. [place], [place] [address]

## 2019-08-09 NOTE — Progress Notes (Signed)
I responded to code stemi. I called and Nurse said no family present at this time. No chaplain services needed. Chaplain available upon request.  Chaplain Resident Fidel Levy [phone]

## 2019-08-09 NOTE — ED Triage Notes (Signed)
Pt bib ems as post arrest, pt received COVID vaccine, was waiting his 15 mins when medic on scene noticed seizure like activity. Pt went unresponsive and was in pulseless v fib. CPR initiated immediately. Pt shocked 6 times, given 450 amio and arrived on epi gtt at 30mcg/min. Pt breathing on his own with king airway, pueposeful movement noted, 2.5 medazolam given and 89mcg fent.

## 2019-08-09 NOTE — ED Notes (Signed)
Cardiology at bedside.

## 2019-08-10 ENCOUNTER — Inpatient Hospital Stay (HOSPITAL_COMMUNITY): Payer: BC Managed Care – PPO

## 2019-08-10 ENCOUNTER — Encounter (HOSPITAL_COMMUNITY): Payer: Self-pay | Admitting: Cardiovascular Disease

## 2019-08-10 DIAGNOSIS — I469 Cardiac arrest, cause unspecified: Secondary | ICD-10-CM | POA: Diagnosis not present

## 2019-08-10 DIAGNOSIS — R402112 Coma scale, eyes open, never, at arrival to emergency department: Secondary | ICD-10-CM | POA: Diagnosis not present

## 2019-08-10 DIAGNOSIS — J9601 Acute respiratory failure with hypoxia: Secondary | ICD-10-CM | POA: Diagnosis not present

## 2019-08-10 DIAGNOSIS — J690 Pneumonitis due to inhalation of food and vomit: Secondary | ICD-10-CM | POA: Diagnosis not present

## 2019-08-10 DIAGNOSIS — Z9861 Coronary angioplasty status: Secondary | ICD-10-CM

## 2019-08-10 DIAGNOSIS — I2510 Atherosclerotic heart disease of native coronary artery without angina pectoris: Secondary | ICD-10-CM

## 2019-08-10 DIAGNOSIS — I25118 Atherosclerotic heart disease of native coronary artery with other forms of angina pectoris: Secondary | ICD-10-CM

## 2019-08-10 DIAGNOSIS — R0989 Other specified symptoms and signs involving the circulatory and respiratory systems: Secondary | ICD-10-CM | POA: Diagnosis not present

## 2019-08-10 DIAGNOSIS — I4901 Ventricular fibrillation: Secondary | ICD-10-CM | POA: Diagnosis not present

## 2019-08-10 DIAGNOSIS — R570 Cardiogenic shock: Secondary | ICD-10-CM | POA: Diagnosis not present

## 2019-08-10 DIAGNOSIS — J9690 Respiratory failure, unspecified, unspecified whether with hypoxia or hypercapnia: Secondary | ICD-10-CM | POA: Diagnosis not present

## 2019-08-10 DIAGNOSIS — N179 Acute kidney failure, unspecified: Secondary | ICD-10-CM

## 2019-08-10 DIAGNOSIS — J811 Chronic pulmonary edema: Secondary | ICD-10-CM | POA: Diagnosis not present

## 2019-08-10 DIAGNOSIS — I25119 Atherosclerotic heart disease of native coronary artery with unspecified angina pectoris: Secondary | ICD-10-CM

## 2019-08-10 HISTORY — DX: Atherosclerotic heart disease of native coronary artery without angina pectoris: I25.10

## 2019-08-10 HISTORY — PX: TRANSTHORACIC ECHOCARDIOGRAM: SHX275

## 2019-08-10 HISTORY — DX: Atherosclerotic heart disease of native coronary artery without angina pectoris: Z98.61

## 2019-08-10 LAB — POCT I-STAT 7, (LYTES, BLD GAS, ICA,H+H)
Acid-base deficit: 2 mmol/L (ref 0.0–2.0)
Bicarbonate: 22 mmol/L (ref 20.0–28.0)
Calcium, Ion: 1.08 mmol/L — ABNORMAL LOW (ref 1.15–1.40)
HCT: 44 % (ref 39.0–52.0)
Hemoglobin: 15 g/dL (ref 13.0–17.0)
O2 Saturation: 88 %
Patient temperature: 98
Potassium: 3.8 mmol/L (ref 3.5–5.1)
Sodium: 138 mmol/L (ref 135–145)
TCO2: 23 mmol/L (ref 22–32)
pCO2 arterial: 35.7 mmHg (ref 32.0–48.0)
pH, Arterial: 7.396 (ref 7.350–7.450)
pO2, Arterial: 53 mmHg — ABNORMAL LOW (ref 83.0–108.0)

## 2019-08-10 LAB — BASIC METABOLIC PANEL
Anion gap: 10 (ref 5–15)
BUN: 13 mg/dL (ref 8–23)
CO2: 23 mmol/L (ref 22–32)
Calcium: 8.2 mg/dL — ABNORMAL LOW (ref 8.9–10.3)
Chloride: 103 mmol/L (ref 98–111)
Creatinine, Ser: 1.21 mg/dL (ref 0.61–1.24)
GFR calc Af Amer: >60 mL/min (ref >60)
GFR calc non Af Amer: >60 mL/min (ref >60)
Glucose, Bld: 127 mg/dL — ABNORMAL HIGH (ref 70–99)
Potassium: 4.2 mmol/L (ref 3.5–5.1)
Sodium: 136 mmol/L (ref 135–145)

## 2019-08-10 LAB — COOXEMETRY PANEL
Carboxyhemoglobin: 0.9 % (ref 0.5–1.5)
Methemoglobin: 0.7 % (ref 0.0–1.5)
O2 Saturation: 58.9 %
Total hemoglobin: 16.7 g/dL — ABNORMAL HIGH (ref 12.0–16.0)

## 2019-08-10 LAB — POCT I-STAT, CHEM 8
BUN: 8 mg/dL (ref 8–23)
Calcium, Ion: 0.92 mmol/L — ABNORMAL LOW (ref 1.15–1.40)
Chloride: 108 mmol/L (ref 98–111)
Creatinine, Ser: 0.7 mg/dL (ref 0.61–1.24)
Glucose, Bld: 193 mg/dL — ABNORMAL HIGH (ref 70–99)
HCT: 40 % (ref 39.0–52.0)
Hemoglobin: 13.6 g/dL (ref 13.0–17.0)
Potassium: 4.7 mmol/L (ref 3.5–5.1)
Sodium: 141 mmol/L (ref 135–145)
TCO2: 19 mmol/L — ABNORMAL LOW (ref 22–32)

## 2019-08-10 LAB — GLUCOSE, CAPILLARY
Glucose-Capillary: 110 mg/dL — ABNORMAL HIGH (ref 70–99)
Glucose-Capillary: 110 mg/dL — ABNORMAL HIGH (ref 70–99)
Glucose-Capillary: 126 mg/dL — ABNORMAL HIGH (ref 70–99)
Glucose-Capillary: 135 mg/dL — ABNORMAL HIGH (ref 70–99)
Glucose-Capillary: 91 mg/dL (ref 70–99)
Glucose-Capillary: 93 mg/dL (ref 70–99)
Glucose-Capillary: 97 mg/dL (ref 70–99)

## 2019-08-10 LAB — POCT ACTIVATED CLOTTING TIME
Activated Clotting Time: 346 seconds
Activated Clotting Time: 219 seconds
Activated Clotting Time: 142 s

## 2019-08-10 LAB — MRSA PCR SCREENING: MRSA by PCR: NEGATIVE

## 2019-08-10 LAB — CBC
HCT: 40.8 % (ref 39.0–52.0)
Hemoglobin: 13.5 g/dL (ref 13.0–17.0)
MCH: 24.9 pg — ABNORMAL LOW (ref 26.0–34.0)
MCHC: 33.1 g/dL (ref 30.0–36.0)
MCV: 75.1 fL — ABNORMAL LOW (ref 80.0–100.0)
Platelets: 316 10*3/uL (ref 150–400)
RBC: 5.43 MIL/uL (ref 4.22–5.81)
RDW: 15.5 % (ref 11.5–15.5)
WBC: 12.4 10*3/uL — ABNORMAL HIGH (ref 4.0–10.5)
nRBC: 0 % (ref 0.0–0.2)

## 2019-08-10 LAB — TROPONIN I (HIGH SENSITIVITY)
Troponin I (High Sensitivity): 5166 ng/L (ref <18)
Troponin I (High Sensitivity): 2911 ng/L (ref <18)

## 2019-08-10 LAB — ECHOCARDIOGRAM COMPLETE
Height: 72 in
Weight: 3597.91 oz

## 2019-08-10 LAB — PROCALCITONIN: Procalcitonin: 18.14 ng/mL

## 2019-08-10 LAB — MAGNESIUM: Magnesium: 1.9 mg/dL (ref 1.7–2.4)

## 2019-08-10 LAB — PHOSPHORUS: Phosphorus: 2 mg/dL — ABNORMAL LOW (ref 2.5–4.6)

## 2019-08-10 MED ORDER — MAGNESIUM SULFATE 2 GM/50ML IV SOLN
2.0000 g | Freq: Once | INTRAVENOUS | Status: AC
  Administered 2019-08-10: 2 g via INTRAVENOUS
  Filled 2019-08-10: qty 50

## 2019-08-10 MED ORDER — SODIUM CHLORIDE 0.9% FLUSH
10.0000 mL | Freq: Two times a day (BID) | INTRAVENOUS | Status: DC
  Administered 2019-08-11 – 2019-08-13 (×3): 10 mL
  Administered 2019-08-13 – 2019-08-14 (×2): 30 mL
  Administered 2019-08-14 – 2019-08-18 (×6): 10 mL

## 2019-08-10 MED ORDER — ATROPINE SULFATE 1 MG/10ML IJ SOSY
PREFILLED_SYRINGE | INTRAMUSCULAR | Status: AC
  Filled 2019-08-10: qty 10

## 2019-08-10 MED ORDER — FUROSEMIDE 10 MG/ML IJ SOLN: 80.0000 mg | Freq: Two times a day (BID) | INTRAMUSCULAR | Status: DC

## 2019-08-10 MED ORDER — POTASSIUM PHOSPHATES 15 MMOLE/5ML IV SOLN
10.0000 mmol | Freq: Once | INTRAVENOUS | Status: AC
  Administered 2019-08-10: 10 mmol via INTRAVENOUS
  Filled 2019-08-10: qty 3.33

## 2019-08-10 MED ORDER — ORAL CARE MOUTH RINSE
15.0000 mL | OROMUCOSAL | Status: DC
  Administered 2019-08-10 (×8): 15 mL via OROMUCOSAL

## 2019-08-10 MED ORDER — FUROSEMIDE 10 MG/ML IJ SOLN
80.0000 mg | Freq: Two times a day (BID) | INTRAMUSCULAR | Status: DC
  Administered 2019-08-10: 80 mg via INTRAVENOUS
  Filled 2019-08-10: qty 8

## 2019-08-10 MED ORDER — MILRINONE LACTATE IN DEXTROSE 20-5 MG/100ML-% IV SOLN
0.2500 ug/kg/min | INTRAVENOUS | Status: DC
  Administered 2019-08-10: 0.25 ug/kg/min via INTRAVENOUS
  Filled 2019-08-10: qty 100

## 2019-08-10 MED ORDER — CHLORHEXIDINE GLUCONATE 0.12% ORAL RINSE (MEDLINE KIT)
15.0000 mL | Freq: Two times a day (BID) | OROMUCOSAL | Status: DC
  Administered 2019-08-10 (×2): 15 mL via OROMUCOSAL

## 2019-08-10 MED ORDER — SODIUM CHLORIDE 0.9% FLUSH: 10.0000 mL | INTRAVENOUS | Status: DC | PRN

## 2019-08-10 MED ORDER — CHLORHEXIDINE GLUCONATE CLOTH 2 % EX PADS
6.0000 | MEDICATED_PAD | Freq: Every day | CUTANEOUS | Status: DC
  Administered 2019-08-10 – 2019-08-18 (×9): 6 via TOPICAL

## 2019-08-10 MED ORDER — ENOXAPARIN SODIUM 40 MG/0.4ML ~~LOC~~ SOLN
40.0000 mg | Freq: Every day | SUBCUTANEOUS | Status: DC
  Administered 2019-08-10 – 2019-08-19 (×10): 40 mg via SUBCUTANEOUS
  Filled 2019-08-10 (×10): qty 0.4

## 2019-08-10 MED FILL — Heparin Sod (Porcine)-NaCl IV Soln 1000 Unit/500ML-0.9%: INTRAVENOUS | Qty: 1000 | Status: AC

## 2019-08-10 NOTE — Progress Notes (Addendum)
Progress Note  Patient Name: John Love Date of Encounter: 08/10/2019  Primary Cardiologist: [name], MD   Patient profile   69 y.o. male who has unknown PMH.  He presented to [facility] ED 2/23 with V.fib cardiac arrest during post COVID-19 vaccine observation. CPR was started, received shock and 450 mg of amio before ROSC. He then transferred to [facility] ED. He was intubated but awake on arrival. Of note, he vomited during king airway removal and intubation. His EKG on arrival showed some ST depression in V4,V5, V6 as well as ST elevation at AVR and he was sent to cath lab>showed 3 vessel disease. Successful mid to distal circumflex and RCA stenting performed to stabilize patoient and he will need staged LAD diagonal branch bifurcation later (likeley complicated intervention). He was started on pressor due to cardiogenic shock. Now improved.  Subjective   Patient was seen and evaluated at bedside this morning.  He opens his eyes to verbal stimuli.  He follows the commands and moves all of his extremities.  He confirms by nodding his head that he has some pain on his chest post CPR.  Inpatient Medications    Scheduled Meds:  aspirin  81 mg Oral Daily   atorvastatin  80 mg Oral q1800   chlorhexidine gluconate (MEDLINE KIT)  15 mL Mouth Rinse BID   Chlorhexidine Gluconate Cloth  6 each Topical Daily   furosemide  80 mg Intravenous BID   insulin aspart  0-15 Units Subcutaneous Q4H   mouth rinse  15 mL Mouth Rinse 10 times per day   pantoprazole (PROTONIX) IV  40 mg Intravenous Daily   sodium chloride flush  10-40 mL Intracatheter Q12H   sodium chloride flush  3 mL Intravenous Q12H   ticagrelor  90 mg Oral BID   Continuous Infusions:  sodium chloride     ampicillin-sulbactam (UNASYN) IV Stopped (08/10/19 0422)   lactated ringers     milrinone     norepinephrine (LEVOPHED) Adult infusion 2 mcg/min (08/10/19 0700)   propofol (DIPRIVAN) infusion 25 mcg/kg/min (08/10/19  0700)   PRN Meds: sodium chloride, acetaminophen, fentaNYL (SUBLIMAZE) injection, fentaNYL (SUBLIMAZE) injection, ondansetron (ZOFRAN) IV, sodium chloride flush, sodium chloride flush   Vital Signs    Vitals:   08/10/19 0645 08/10/19 0700 08/10/19 0715 08/10/19 0741  BP:  105/73  (!) 142/72  Pulse:    77  Resp: (!) 24 19 (!) 24 20  Temp: 98.2 F (36.8 C) 98.2 F (36.8 C) 98.2 F (36.8 C)   TempSrc:      SpO2: 99% 99% 99% 99%  Weight:      Height:        Intake/Output Summary (Last 24 hours) at 08/10/2019 0750 Last data filed at 08/10/2019 0700 Gross per 24 hour  Intake 1326.6 ml  Output 1960 ml  Net -633.4 ml   Last 3 Weights 08/09/2019 08/09/2019  Weight (lbs) 224 lb 13.9 oz 260 lb  Weight (kg) 102 kg 117.935 kg      Telemetry    Sinus with PAC- Personally Reviewed  ECG     SNR- Personally Reviewed  Physical Exam   GEN: Intubated, awake, No acute distress.   Neck: No JVD Cardiac: RRR, no murmurs, rubs, or gallops.  Respiratory: Intubated, on ventilator Clear to auscultation bilaterally. GI: Soft, nontender, non-distended  MS: No edema; No deformity. Neuro:  intubated, awake, follows commands, moves all extremities. Nonfocal   Labs    High Sensitivity Troponin:   Recent Labs  Lab 08/09/19 1807 08/09/19 2203  TROPONINIHS 75* 8,006*      Chemistry Recent Labs  Lab 08/09/19 1807 08/09/19 2202 08/10/19 0426  NA 135 138 136  K 3.8 3.8 4.2  CL 99  --  103  CO2 16*  --  23  GLUCOSE 301*  --  127*  BUN 8  --  13  CREATININE 1.25*  --  1.21  CALCIUM 8.7*  --  8.2*  PROT 6.3*  --   --   ALBUMIN 2.7*  --   --   AST 98*  --   --   ALT 71*  --   --   ALKPHOS 74  --   --   BILITOT 0.3  --   --   GFRNONAA 59*  --  >60  GFRAA >60  --  >60  ANIONGAP 20*  --  10     Hematology Recent Labs  Lab 08/09/19 1807 08/09/19 2202 08/10/19 0612  WBC 13.9*  --  12.4*  RBC 5.76  --  5.43  HGB 14.5 15.0 13.5  HCT 48.3 44.0 40.8  MCV 83.9  --  75.1*  MCH  25.2*  --  24.9*  MCHC 30.0  --  33.1  RDW 17.1*  --  15.5  PLT 289  --  316    BNPNo results for input(s): BNP, PROBNP in the last 168 hours.   DDimer No results for input(s): DDIMER in the last 168 hours.   Radiology    CARDIAC CATHETERIZATION  Result Date: 08/09/2019  Prox RCA lesion is 80% stenosed.  Ost RCA to Prox RCA lesion is 50% stenosed.  1st Diag lesion is 95% stenosed.  Prox LAD to Mid LAD lesion is 75% stenosed.  3rd Mrg lesion is 100% stenosed.  Mid Cx to Dist Cx lesion is 80% stenosed.  A stent was successfully placed.  Post intervention, there is a 0% residual stenosis.  A stent was successfully placed.  Post intervention, there is a 0% residual stenosis.  A drug-eluting stent was successfully placed.  Post intervention, there is a 0% residual stenosis.  Post intervention, there is a 0% residual stenosis.  Emerson Schreifels is a 69 y.o. male  [id number] LOCATION:  FACILITY: [facility] PHYSICIAN: Quay Burow, M.D. 1950-12-16 DATE OF PROCEDURE:  08/09/2019 DATE OF DISCHARGE: CARDIAC CATHETERIZATION / PCI DES LCX/RCA/ RHC History obtained from chart review.  Mr. Bresee is a 69 year old moderately overweight African-American gentleman without prior cardiac history who apparently had his Covid vaccine shot early evening and had witnessed cardiac arrest after that with ventricular fibrillation.  He had CPR defibrillation and ROSC and approxi-20 to 25 minutes with spontaneous movement.  He was combative, was intubated and sedated.  His EKG showed lateral ST segment depression with slight ST segment elevation in lead aVR.  He was evaluated by Dr. Ellyn Hack in the ER who felt that he should be brought up for emergent cardiac catheterization.  Initially his blood pressure was in the systolic 80 range and he was placed on IV Levophed. PROCEDURE DESCRIPTION: The patient was brought to the second floor [facility] cath lab in the postabsorptive state. He was premedicated with IV Versed,  fentanyl and propofol.. His right groin was prepped and shaved in usual sterile fashion. Xylocaine 1% was used for local anesthesia. A 6 French sheath was inserted into the right common femoral artery using standard Seldinger technique.  An 8 French sheath was inserted into the right common femoral vein.  5 French right left second Silastic catheters on 5 French pigtail catheter used for selective coronary angiography, and left ventriculography using "a hand shot with obtaining left heart pressures.  Isovue dye is used for the entirety of the case.  Retrograde aorta, ventricular pullback pressures were recorded.  A VIP 7.5 French Swan-Ganz catheter was carefully maneuvered into the pulmonary artery obtaining sequential pressures. The circumflex was most likely the "culprit lesion" and this was initially addressed.  It was decided to leave the LAD diagonal branch bifurcation alone since there was TIMI-3 flow and this was a heavy calcified vessel and to proceed with RCA intervention should the circumflex go without complication.  The patient received Angiomax bolus followed by infusion with a ACT of greater than 300.  Because I was unable to give ticagrelor p.o. or via OG because of uncertainty of placement at bolus the patient with Cangrelor and placed him on a Cangrelor drip.  Isovue dye was used for the entirety of the intervention.  Retroaortic pressures monitored in the case. The circumflex was initially addressed using a 6 French XB 3.5 cm guide catheter.  A 0.14 Prowater guidewire was used to cross the distal circumflex obtuse marginal branch lesion which was predilated with a 2 mm x 12 mm balloon.  There was a 60 to 70% lesion upstream from this.  I then placed a 2.25 x 28 mm long Synergy drug-eluting stent across both lesions and deployed at 16 atm.  I postdilated the proximal segment of the tube with a 3.5 mm x 24 mill meter long Synergy drug-eluting stent deploying at 16 atm.  I postdilated with a 4 mm x 15  mm noncompliant balloon but was unable to originally with a 2.7 0.5 x 12 mm long noncompliant balloon.  There was some spasm downstream which resolved with intracoronary glycerin and proximal postdilatation with excellent result. The patient was moderately hyper hypotensive during the procedure and received a 500 cc fluid bolus and went and had his Levophed infusion rate adjusted appropriately. I then addressed the RCA using a 6 [place] JR4 guide catheter, a 0.14 Prowater guidewire.  I direct stented this with a 3.5 mm x 24 mm long Synergy drug-eluting stent deployed at 16 atm.  I postdilated with a 4 mm x 15 mm balloon but was unable to pass the proximal struts and therefore used a second prowater for "buddy wire technique which allowed the post dilatation balloon to easily track into the stented segment post dilating the entire stent at 14 to 16 atm resulting reduction of an 80% proximal dominant RCA stenosis to 0% residual.  The patient tolerated procedure well.  He was sedated and ventilated throughout the case. The patient's LVEDP was 11 and his wedge was 15 suggesting that he was potentially mildly hypovolemic and did respond to a fluid bolus.   Successful mid to distal circumflex stenting which was the "culprit lesion in the setting of witnessed VF arrest post Covid vaccine administration.  I wonder whether this was an anaphylactic reaction creating hypotension and acute coronary artery thrombosis in the setting of three-vessel disease.  He has successful RCA intervention as well.  He will need staged LAD diagonal branch bifurcation intervention using orbital atherectomy given the calcific nature of the disease assuming that he neurologically recovers.  I spoken to PCCM and they are aware of the patient and will manage his vent.  We will discuss whether he needs Arctic sun, systemic cooling. Quay Burow. MD, [facility] 08/09/2019 8:49 PM  Portable Chest xray  Result Date: 08/10/2019 CLINICAL DATA:  Status  post catheterization, intubated EXAM: PORTABLE CHEST 1 VIEW COMPARISON:  08/09/2019 FINDINGS: Two frontal views of the chest demonstrates endotracheal tube overlying tracheal air column tip at level of thoracic inlet. Enteric catheter coils over the gastric antrum. Central venous catheter from an inferior approach, wedged within the periphery of the right pulmonary artery. There is central vascular congestion. Consolidation at the right lung base with associated right-sided volume loss compatible with atelectasis. There may be a small right pleural effusion. No pneumothorax. IMPRESSION: 1. Right-sided volume loss with right basilar consolidation, favor atelectasis. 2. Central vascular congestion. 3. Support devices as above, with flow directed central venous catheter wedged within the periphery of the right pulmonary artery. Electronically Signed   By: Randa Ngo M.D.   On: 08/10/2019 00:50   DG Chest Port 1 View  Result Date: 08/09/2019 CLINICAL DATA:  69 year old male status post CPR. EXAM: PORTABLE CHEST 1 VIEW COMPARISON:  None. FINDINGS: Endotracheal tube with tip approximately 6 cm above the carina. Enteric tube extends below the diaphragm with tip beyond the inferior margin of the image. There is cardiomegaly with vascular congestion. No consolidative changes. There is no pleural effusion or pneumothorax. No acute osseous pathology. IMPRESSION: 1. Endotracheal tube above the carina. 2. Cardiomegaly with vascular congestion. Electronically Signed   By: Anner Crete M.D.   On: 08/09/2019 18:53    Cardiac Studies   Heart Cath -PCI 08/09/2019:   CULPRIT LESION: Mid Cx to Dist Cx lesion is 80% stenosed. 3rd Mrg lesion is 100% stenosed at small branch take-off.  DES PCI: 2.25 mm x 28 mm Synergy DES (proximal postdilated 2.75 mm.   Post intervention, there is a 0% residual stenosis.  Small side branch was occluded  Ost RCA to Prox RCA lesion is 50% stenosed.Prox RCA lesion is 80%  stenosed.   DES PCI: 3.5 mm x 24 mm Synergy DES (postdilated to 4.1 mm)  Post intervention, there is a 0% residual stenosis.   Prox LAD to Mid LAD lesion is 75% stenosed.1st Diag lesion is 95% stenosed. [place] 1,1,1 - calcified bifurcation lesion    Diagnostic Dominance: Right  Intervention    Echo 08/10/2019: IMPRESSIONS    1. Left ventricular ejection fraction, by estimation, is 55 to 60%. The  left ventricle has normal function. The left ventricle demonstrates  regional wall motion abnormalities (see scoring diagram/findings for  description). There is mild concentric left  ventricular hypertrophy. Left ventricular diastolic parameters are  consistent with Grade I diastolic dysfunction (impaired relaxation).  2. Right ventricular systolic function is normal. The right ventricular  size is normal.  3. The mitral valve is grossly normal. No evidence of mitral valve  regurgitation. No evidence of mitral stenosis.  4. The aortic valve is grossly normal. Aortic valve regurgitation is not  visualized. No aortic stenosis is present.   Comparison(s): No prior Echocardiogram.   Patient Profile     69 y.o. male  With unknown PMH.  He presented to [facility] ED 2/23 with V.fib cardiac arrest during post COVID-19 vaccine observation. CPR was started, received shock and 450 mg of amio before ROSC. He then transferred to [facility] ED. He was intubated but awake on arrival. Of note, he vomited during king airway removal and intubation. His EKG on arrival showed some ST depression in V4,V5, V6 as well as ST elevation at AVR and he was sent to cath lab>showed 3 vessel disease. Successful mid to distal  circumflex and RCA stenting performed to stabilize patoient and he will need staged LAD diagonal branch bifurcation later (likeley complicated intervention). He was started on pressor due to cardiogenic shock. Now improved.  Assessment & Plan    Cardiac arrest-V fib:Witnessed V fib-cardiac arrest,  occurred out of hospital and after receiving COVID-19 vaccine. Received ROSC before arriving to the hospital, after CPR, 450 mg of Amiodarone and several times of shock. Was inutabated but awake and purposfully moved his extremities on arrival. Ischemic cardiac event (EKG showed ST elevation in AVR and St depression in V4-V6) S/p emergent heart cath that showed multi vessle disease. Stabilized with PCI to RCA and Cx. He will need staged PCI to LAD-diagonal bifurcation that may be complicated and will not rush it during this hospitalization.  Cardiogenic shock: Was on epi drip that switched to Levophed when arrived to the ED.  -- Trop 75>8006  Assessment and plan:  Intubated and on ventilator Awake His telemetry review shows sinus rhythm. CXR shows rt chest diffuse opacity. Suggestive of pneumonitis 2/2 aspiration vs pulmonary edema  CVP 10 PA 37/18 CI low at 1.6 Co-Ox 58%  -Appreciate heart failure team recommendation -> right heart cath cardiac index estimated at 1.6.  With low urine output, initially started on milrinone with IV Lasix and continuing Levophed.  After echocardiogram revealed relatively normal EF, the plan was to wean off milrinone and Levophed after IV Lasix diuresis with plans to extubate.  -Continue ASA and brilinta -Continue high-dose, high intensity statin -Is on Levophed but then trying to wean per HF -PCCM is on board. Will likely try to wean and extubate -Lasix and Milrinon prior to extubation to decrease pulmonary edema/congestion -f/u echo>EF 55-60%, The left ventricle demonstrates  regional wall motion abnormalities grade 1DD -Continue lasix 80 mg BID IV   Hematuria: Improved Likely traumatic when inserting foley  Witnessed aspiration pneumonitis: -On Unazyn   Acute hypoxic respiratory failure: Probable pulmonary edema on CXR Post cardiac arrest Intubated -PCCM is on board, wean and extubated likely today. Appreciate recommendation   For  questions or updates, please contact [place] Please consult [email or web address] for contact info under        Signed, Dewayne Hatch, MD  08/10/2019, 7:50 AM     ATTENDING ATTESTATION  I have seen, examined and evaluated the patient this morning on rounds along with Dr. Myrtie Hawk, (Resident).  After reviewing all the available data and chart, we discussed the patients laboratory, study & physical findings as well as symptoms in detail. I agree with her findings, examination as well as impression recommendations as per our discussion.    Attending adjustments noted in italics.   Patient well-known to me as I was involved with his admission.  Unknown past medical history admitted with cardiac arrest shortly after having second COVID-19 vaccination.  Had immediate initiation of CPR, but did not have ROSC for over 20 minutes.  Upon arrival: He did have some neurologic improvement and quite ischemic-looking EKG.  Brought to the Cath Lab with significant CAD noted above.  Two-vessel PCI with potential plan for staged LAD of diagonal disease PCI in the future.  Right heart cath numbers showed CVP of roughly 9-10 with PA mean of roughly 20 NP-C PCWP of 22 indicating persistent diastolic dysfunction.  With this initial evaluation we also is measured cardiac index of 1.6 with CoOx 58.    After discussion with Dr. Aundra Dubin, we continued Levophed and added milrinone for short-term infusion with IV Lasix.  Plan  was to diurese to allow for extubation as chest x-ray showed concern for pulmonary edema. I am also concerned that the chest x-ray could be related to his aspiration pneumonitis.  He is on Unasyn.  Discussed with Dr. Lynetta Mare who is done a bedside echocardiogram showing relatively preserved EF.  At this point the plan will be to see how she responds to current dose of IV Lasix but plan to move toward extubation later on today. -Concern is frothy appearing sputum in the in the ET tube, I suspect  that some of this is also related to vomitus that happened in the emergency room.  Needed sure that you think able to protect airway prior to extubation.  Will defer management to PCCM.  He is still borderline cardiogenic shock now with mostly resolved shock, but as we are just weaning off Levophed, would not consider starting afterload reduction or beta-blocker.  Thankfully the cardiac echo showed well recovered EF.   At this point the plan will be to await for extubation and continue TAVR treatment occasions.  Need to consider timing of PCI on the LAD diagonal branch.  As long as he not having active symptoms, we can somewhat delay, however would probably like to have this done prior to discharge.  I need to review the images with interventional colleagues, it does appear to be somewhat calcified relatively difficult bifurcation lesion.   Would not consider PCI attempt until early next week to allow for more full overall recovery from the initial event.  Unless he is having active angina symptoms or worsening hemodynamics,, would avoid a more complicated bifurcation PCI.  As tolerating p.o., would ensure that he is on statin and getting dual antiplatelet therapy.  Once were able to further assess pressures etc., we can consider beta-blocker etc.  The patient is critically ill with multiple organ systems failure and requires high complexity decision making for assessment and support, frequent evaluation and titration of therapies, application of advanced monitoring technologies and extensive interpretation of multiple databases.   Critical Care Time devoted to patient care services described in this note is  72 Minutes. This time reflects time of care of this signee [name], MD This critical care time does not reflect procedure time, or teaching time or supervisory time of PA/NP/Med student/Med Resident etc but could involve care discussion time with the patient, family & RN  staff.    [name], M.D., M.S. Interventional Cardiologist   Pager # [phone] Phone # [phone] [address] [place], [place] [address]

## 2019-08-10 NOTE — Progress Notes (Signed)
Per MD Kalman Shan, leaving Lutz and Sheaths in place overnight.

## 2019-08-10 NOTE — Progress Notes (Signed)
  Echocardiogram 2D Echocardiogram has been performed.  John Love 08/10/2019, 10:36 AM

## 2019-08-10 NOTE — Progress Notes (Signed)
Dr. Kalman Shan at bedside, pulled swan catheter out from 75cm to 70cm based on CXR results. Per MD, no need for repeat xray for placement. Will utilize routine CXR in AM for placement verification. Waveforms remain the same. PAP low 40/20's prior to adjustment. PAP now 45/25.

## 2019-08-10 NOTE — Progress Notes (Signed)
Low O2 on ABG, inreased Peep per MD order and O2 for now.

## 2019-08-10 NOTE — Procedures (Signed)
Extubation Procedure Note  Patient Details:   Name: John Love DOB: 15-Feb-1951 MRN: [id number]   Airway Documentation:    Vent end date: 08/10/19 Vent end time: 1428   Evaluation  O2 sats: stable throughout Complications: No apparent complications Patient did tolerate procedure well. Bilateral Breath Sounds: Diminished   Yes   Pt extubated to 4 l/m [name] per physican's order  [name] 08/10/2019, 2:29 PM

## 2019-08-10 NOTE — Progress Notes (Signed)
NAME:  John Love, MRN:  [id number], DOB:  19-Oct-1950, LOS: 1 ADMISSION DATE:  08/09/2019, CONSULTATION DATE:  08/09/19 REFERRING MD:  Ellyn Hack  CHIEF COMPLAINT:  Cardiac Arrest   Brief History   John Love is a 69 y.o. male who was admitted 2/23 after V.fib arrest following his 2nd COVID vaccine.  History of present illness   Pt is encephelopathic; therefore, this HPI is obtained from chart review. John Love is a 69 y.o. male who has unknown PMH.  He presented to [facility] ED 2/23 with V.fib arrest.  He had received his 2nd COVID vaccine and during his post vaccine 15 minute observation period, he was noted to have seizure like activity before becoming unresponsive. CPR was started and upon AED placement, shock was advised for V.fib.  He required 6 shocks and 450 amio before ROSC.  Total downtime was 20 minutes.  He was started on an epi infusion and transported to ED.  In ED, it was initially felt that he did have purposeful movement.  King airway was replaced with ETT and there was evidence of aspiration.    He was found to have abnormal EKG; therefore, he was taken to cath lab.  In cath lab, he was found to have prox RCA lesion80% stenosed, Ost RCA to prox RCA lesion 50% stenosed, 1st diag lesion 95% stenosed, prox LAD to mid LAD lesion 75% stenosed, mid Cx to dist Cx lesion 80% stenosed.  He had a stent placed to RCA and Cx.  PCCM asked to admit to ICU.  Upon arrival to ICU, he is awake on vent despite propofol and is able to follow all basic commands.  Past Medical History  has Cardiac arrest ([place]); Sustained ventricular fibrillation ([place]); Cardiogenic shock (HCC)-borderline; Respiratory failure ([place]); and AKI (acute kidney injury) ([place]) on their problem list.  Significant Hospital Events   2/23 > admit.  Consults:  Cardiology.  Procedures:  ETT 2/23 >  R fem swan 2/23 >    Significant Diagnostic Tests:  CXR 2/23 > vascular congestion. Cath 2/23 > prox RCA lesion80%  stenosed, Ost RCA to prox RCA lesion 50% stenosed, 1st diag lesion 95% stenosed, prox LAD to mid LAD lesion 75% stenosed, mid Cx to dist Cx lesion 80% stenosed.  He had a stent placed to RCA and Cx. Echo 2/24 >   Micro Data:  Flu 2/23 > neg. COVID 2/23 > neg.  Antimicrobials:  Unasyn 2/23 >    Interim history/subjective:  Following commands on vent.   Objective:  Blood pressure 128/77, pulse 92, temperature 98.1 F (36.7 C), resp. rate (!) 24, height 6' (1.829 m), weight 102 kg, SpO2 100 %. PAP: (30-49)/(15-32) 42/20 CVP:  [8 mmHg-20 mmHg] 13 mmHg PCWP:  [22 mmHg] 22 mmHg CO:  [2.6 L/min-5.9 L/min] 5.3 L/min CI:  [1.2 L/min/m2-2.6 L/min/m2] 2.4 L/min/m2  Vent Mode: PSV;CPAP FiO2 (%):  [40 %-100 %] 40 % Set Rate:  [18 bmp-24 bmp] 24 bmp Vt Set:  [620 mL] 620 mL PEEP:  [5 cmH20-8 cmH20] 5 cmH20 Pressure Support:  [5 cmH20-12 cmH20] 5 cmH20 Plateau Pressure:  [18 cmH20-27 cmH20] 25 cmH20   Intake/Output Summary (Last 24 hours) at 08/10/2019 1330 Last data filed at 08/10/2019 1300 Gross per 24 hour  Intake 1772.75 ml  Output 3960 ml  Net -2187.25 ml   Filed Weights   08/09/19 1814 08/09/19 2200  Weight: 117.9 kg 102 kg    Examination: General: Adult male, in NAD. Neuro: Awake  on vent.  Following commands. HEENT: Daniel/AT. Sclerae anicteric.  ETT in place. Cardiovascular: RRR, no M/R/G.  Lungs: Respirations even and unlabored.  CTA bilaterally, No W/R/R.  Tolerated 2.5 h SBT Abdomen: BS x 4, soft, NT/ND.  Musculoskeletal: No gross deformities, no edema.  Skin: Intact, warm, no rashes.  I performed a bedside POC echo which showed moderately reduced LV EF by visual estimate. Lateral wall hypokinesis. Normal filling pressures. IVC dilated with normal respiratory variation with ventilation.  Interstitial pattern seen on right side consistent with aspiration.   Assessment & Plan:   V.Fib arrest - presumed due to underlying RCA and Cx lesions then exacerbated by possible  hypotension post COVID shot.  S/p cardiac cath 2/23 with stents placed to RCA and Cx. - Continue Post-MI care - Milrinone weaned off   Acute hypoxic respiratory failure due to aspiration - in the setting of above.  - Extubate today.   Probable aspiration. - Empiric unasyn. - Follow cultures.  AKI - LR @ 75. - Follow BMP.   Hyperglycemia. - SSI.   Best Practice:  Diet: NPO. Pain/Anxiety/Delirium protocol (if indicated): Propofol gtt / Fentanyl PRN.  RASS goal 0 to -1. VAP protocol (if indicated): In place. DVT prophylaxis: SCD's. GI prophylaxis: PPI. Glucose control: SSI. Mobility: Bedrest. Code Status: Full. Family Communication: Wife updated at bedside. Disposition: ICU.  Labs   CBC: Recent Labs  Lab 08/09/19 1807 08/09/19 2202 08/10/19 0612  WBC 13.9*  --  12.4*  NEUTROABS 6.8  --   --   HGB 14.5 15.0 13.5  HCT 48.3 44.0 40.8  MCV 83.9  --  75.1*  PLT 289  --  338   Basic Metabolic Panel: Recent Labs  Lab 08/09/19 1807 08/09/19 2202 08/10/19 0426  NA 135 138 136  K 3.8 3.8 4.2  CL 99  --  103  CO2 16*  --  23  GLUCOSE 301*  --  127*  BUN 8  --  13  CREATININE 1.25*  --  1.21  CALCIUM 8.7*  --  8.2*  MG  --   --  1.9  PHOS  --   --  2.0*   GFR: Estimated Creatinine Clearance: 73.2 mL/min (by C-G formula based on SCr of 1.21 mg/dL). Recent Labs  Lab 08/09/19 1807 08/10/19 0612 08/10/19 0820  PROCALCITON  --   --  18.14  WBC 13.9* 12.4*  --    Liver Function Tests: Recent Labs  Lab 08/09/19 1807  AST 98*  ALT 71*  ALKPHOS 74  BILITOT 0.3  PROT 6.3*  ALBUMIN 2.7*   No results for input(s): LIPASE, AMYLASE in the last 168 hours. No results for input(s): AMMONIA in the last 168 hours. ABG    Component Value Date/Time   PHART 7.396 08/09/2019 2202   PCO2ART 35.7 08/09/2019 2202   PO2ART 53.0 (L) 08/09/2019 2202   HCO3 22.0 08/09/2019 2202   TCO2 23 08/09/2019 2202   ACIDBASEDEF 2.0 08/09/2019 2202   O2SAT 58.9 08/10/2019 0433     Coagulation Profile: Recent Labs  Lab 08/09/19 1807  INR 1.3*   Cardiac Enzymes: No results for input(s): CKTOTAL, CKMB, CKMBINDEX, TROPONINI in the last 168 hours. HbA1C: Hgb A1c MFr Bld  Date/Time Value Ref Range Status  08/09/2019 06:07 PM 6.3 (H) 4.8 - 5.6 % Final    Comment:    (NOTE) Pre diabetes:          5.7%-6.4% Diabetes:              >  6.4% Glycemic control for   <7.0% adults with diabetes    CBG: Recent Labs  Lab 08/09/19 2156 08/10/19 0004 08/10/19 0404 08/10/19 0847 08/10/19 1248  GLUCAP 144* 135* 126* 110* 110*    Review of Systems:   Unable to obtain as pt is encephalopathic.  Past medical history  He,  has no past medical history on file.   Surgical History   Unknown.  Social History      Family history   His family history is not on file.   Allergies Not on File   Home meds  Prior to Admission medications   Not on File    CRITICAL CARE Performed by: Kipp Brood   Total critical care time: 45 minutes  Critical care time was exclusive of separately billable procedures and treating other patients.  Critical care was necessary to treat or prevent imminent or life-threatening deterioration.  Critical care was time spent personally by me on the following activities: development of treatment plan with patient and/or surrogate as well as nursing, discussions with consultants, evaluation of patient's response to treatment, examination of patient, obtaining history from patient or surrogate, ordering and performing treatments and interventions, ordering and review of laboratory studies, ordering and review of radiographic studies, pulse oximetry, re-evaluation of patient's condition and participation in multidisciplinary rounds.  Kipp Brood, MD [facility] ICU Physician [place]  Pager: [phone] Mobile: [phone] After hours: [phone].   08/10/2019, 1:30 PM

## 2019-08-10 NOTE — Consult Note (Addendum)
Advanced Heart Failure Team Consult Note   Primary Physician: No primary care provider on file. PCP-Cardiologist:  [name], MD  Reason for Consultation: CHF  HPI:    John Love is seen today for evaluation of CHF/shock at the request of Dr. Ellyn Hack.   69 y.o. with no known prior cardiac history was at the vaccine clinic yesterday to get his COVID shot.  He had his shot uneventfully and will sitting in the post-vaccine observation area when he was noted to have seizure-like activity. CPR was begun by a paramedic on site almost immediately and patient was noted to be in ventricular fibrillation.  He had 3 shocks with ROSC.  When he arrived in the ER, he had purposeful movement and was not cooled.  ECG showed deep lateral ST depression and STE in AVR. He was taken to the cath lab and found to have severe 3 vessel disease with 80% prox RCA, 75% prox LAD/95% ostial D1 bifurcation lesion, and culprit occlusion of PLOM.  He had PCI to the prox RCA and LCx into PLOM.  John Love was placed.    Overnight, he required escalating norepinephrine dosing to maintain MAP.  This has now improved and BP is stable on norepinephrine 2.  Cardiac index low off Swan this morning at 1.6 though co-ox not bad at 58%.  CXR with pulmonary edema, possibly some aspiration on right.  Pink frothy sputum in ET tube, not much UOP. Creatinine stable today at 1.2.   Swan numbers: CVP 10 PA 37/18 CI 1.6 Co-ox 58%  Review of Systems: Unable to review, patient intubated.   Home Medications Prior to Admission medications   Not on File    Past Medical History: History reviewed. No pertinent past medical history.  Family History: CAD  Social History: Social History   Socioeconomic History  . Marital status: Not on file    Spouse name: Not on file  . Number of children: Not on file  . Years of education: Not on file  . Highest education level: Not on file  Occupational History  . Not on file  Tobacco Use   . Smoking status: Not on file  Substance and Sexual Activity  . Alcohol use: Not on file  . Drug use: Not on file  . Sexual activity: Not on file  Other Topics Concern  . Not on file  Social History Narrative  . Not on file   Social Determinants of Health   Financial Resource Strain:   . Difficulty of Paying Living Expenses: Not on file  Food Insecurity:   . Worried About Charity fundraiser in the Last Year: Not on file  . Ran Out of Food in the Last Year: Not on file  Transportation Needs:   . Lack of Transportation (Medical): Not on file  . Lack of Transportation (Non-Medical): Not on file  Physical Activity:   . Days of Exercise per Week: Not on file  . Minutes of Exercise per Session: Not on file  Stress:   . Feeling of Stress : Not on file  Social Connections:   . Frequency of Communication with Friends and Family: Not on file  . Frequency of Social Gatherings with Friends and Family: Not on file  . Attends Religious Services: Not on file  . Active Member of Clubs or Organizations: Not on file  . Attends Archivist Meetings: Not on file  . Marital Status: Not on file    Allergies:  Not  on File  Objective:    Vital Signs:   Temp:  [96.8 F (36 C)-98.2 F (36.8 C)] 98.2 F (36.8 C) (02/24 0715) Pulse Rate:  [68-110] 77 (02/24 0741) Resp:  [0-25] 20 (02/24 0741) BP: (80-209)/(57-124) 142/72 (02/24 0741) SpO2:  [92 %-100 %] 99 % (02/24 0741) Arterial Line BP: (91-176)/(59-107) 104/61 (02/24 0715) FiO2 (%):  [40 %-100 %] 40 % (02/24 0741) Weight:  [102 kg-117.9 kg] 102 kg (02/23 2200) Last BM Date: (pta)  Weight change: Filed Weights   08/09/19 1814 08/09/19 2200  Weight: 117.9 kg 102 kg    Intake/Output:   Intake/Output Summary (Last 24 hours) at 08/10/2019 0809 Last data filed at 08/10/2019 0700 Gross per 24 hour  Intake 1326.6 ml  Output 1960 ml  Net -633.4 ml      Physical Exam    General:  Awake on vent HEENT: normal Neck:  supple. JVP 10. Carotids 2+ bilat; no bruits. No lymphadenopathy or thyromegaly appreciated. Cor: PMI nondisplaced. Regular rate & rhythm. No rubs, gallops or murmurs. Lungs: Dependent crackles Abdomen: soft, nontender, nondistended. No hepatosplenomegaly. No bruits or masses. Good bowel sounds. Extremities: no cyanosis, clubbing, rash, edema Neuro: Follow commands   Telemetry   NSR 70s (personally reviewed)  EKG    Initially NSR with deep lateral ST depression and STE in AVR.  This morning normal ECG (personally reviewed)  Labs   Basic Metabolic Panel: Recent Labs  Lab 08/09/19 1807 08/09/19 2202 08/10/19 0426  NA 135 138 136  K 3.8 3.8 4.2  CL 99  --  103  CO2 16*  --  23  GLUCOSE 301*  --  127*  BUN 8  --  13  CREATININE 1.25*  --  1.21  CALCIUM 8.7*  --  8.2*  MG  --   --  1.9  PHOS  --   --  2.0*    Liver Function Tests: Recent Labs  Lab 08/09/19 1807  AST 98*  ALT 71*  ALKPHOS 74  BILITOT 0.3  PROT 6.3*  ALBUMIN 2.7*   No results for input(s): LIPASE, AMYLASE in the last 168 hours. No results for input(s): AMMONIA in the last 168 hours.  CBC: Recent Labs  Lab 08/09/19 1807 08/09/19 2202 08/10/19 0612  WBC 13.9*  --  12.4*  NEUTROABS 6.8  --   --   HGB 14.5 15.0 13.5  HCT 48.3 44.0 40.8  MCV 83.9  --  75.1*  PLT 289  --  316    Cardiac Enzymes: No results for input(s): CKTOTAL, CKMB, CKMBINDEX, TROPONINI in the last 168 hours.  BNP: BNP (last 3 results) No results for input(s): BNP in the last 8760 hours.  ProBNP (last 3 results) No results for input(s): PROBNP in the last 8760 hours.   CBG: Recent Labs  Lab 08/09/19 2156 08/10/19 0004 08/10/19 0404  GLUCAP 144* 135* 126*    Coagulation Studies: Recent Labs    08/09/19 1807  LABPROT 16.0*  INR 1.3*     Imaging   CARDIAC CATHETERIZATION  Result Date: 08/09/2019  Prox RCA lesion is 80% stenosed.  Ost RCA to Prox RCA lesion is 50% stenosed.  1st Diag lesion is 95%  stenosed.  Prox LAD to Mid LAD lesion is 75% stenosed.  3rd Mrg lesion is 100% stenosed.  Mid Cx to Dist Cx lesion is 80% stenosed.  A stent was successfully placed.  Post intervention, there is a 0% residual stenosis.  A stent was successfully placed.  Post intervention, there is a 0% residual stenosis.  A drug-eluting stent was successfully placed.  Post intervention, there is a 0% residual stenosis.  Post intervention, there is a 0% residual stenosis.  John Love is a 69 y.o. male  [id number] LOCATION:  FACILITY: [facility] PHYSICIAN: Quay Burow, M.D. 1950-11-29 DATE OF PROCEDURE:  08/09/2019 DATE OF DISCHARGE: CARDIAC CATHETERIZATION / PCI DES LCX/RCA/ RHC History obtained from chart review.  Mr. Handler is a 69 year old moderately overweight African-American gentleman without prior cardiac history who apparently had his Covid vaccine shot early evening and had witnessed cardiac arrest after that with ventricular fibrillation.  He had CPR defibrillation and ROSC and approxi-20 to 25 minutes with spontaneous movement.  He was combative, was intubated and sedated.  His EKG showed lateral ST segment depression with slight ST segment elevation in lead aVR.  He was evaluated by Dr. Ellyn Hack in the ER who felt that he should be brought up for emergent cardiac catheterization.  Initially his blood pressure was in the systolic 80 range and he was placed on IV Levophed. PROCEDURE DESCRIPTION: The patient was brought to the second floor [facility] cath lab in the postabsorptive state. He was premedicated with IV Versed, fentanyl and propofol.. His right groin was prepped and shaved in usual sterile fashion. Xylocaine 1% was used for local anesthesia. A 6 French sheath was inserted into the right common femoral artery using standard Seldinger technique.  An 8 French sheath was inserted into the right common femoral vein.  5 French right left second Silastic catheters on 5 French pigtail catheter used for  selective coronary angiography, and left ventriculography using "a hand shot with obtaining left heart pressures.  Isovue dye is used for the entirety of the case.  Retrograde aorta, ventricular pullback pressures were recorded.  A VIP 7.5 French Swan-Ganz catheter was carefully maneuvered into the pulmonary artery obtaining sequential pressures. The circumflex was most likely the "culprit lesion" and this was initially addressed.  It was decided to leave the LAD diagonal branch bifurcation alone since there was TIMI-3 flow and this was a heavy calcified vessel and to proceed with RCA intervention should the circumflex go without complication.  The patient received Angiomax bolus followed by infusion with a ACT of greater than 300.  Because I was unable to give ticagrelor p.o. or via OG because of uncertainty of placement at bolus the patient with Cangrelor and placed him on a Cangrelor drip.  Isovue dye was used for the entirety of the intervention.  Retroaortic pressures monitored in the case. The circumflex was initially addressed using a 6 French XB 3.5 cm guide catheter.  A 0.14 Prowater guidewire was used to cross the distal circumflex obtuse marginal branch lesion which was predilated with a 2 mm x 12 mm balloon.  There was a 60 to 70% lesion upstream from this.  I then placed a 2.25 x 28 mm long Synergy drug-eluting stent across both lesions and deployed at 16 atm.  I postdilated the proximal segment of the tube with a 3.5 mm x 24 mill meter long Synergy drug-eluting stent deploying at 16 atm.  I postdilated with a 4 mm x 15 mm noncompliant balloon but was unable to originally with a 2.7 0.5 x 12 mm long noncompliant balloon.  There was some spasm downstream which resolved with intracoronary glycerin and proximal postdilatation with excellent result. The patient was moderately hyper hypotensive during the procedure and received a 500 cc fluid bolus and went and  had his Levophed infusion rate adjusted  appropriately. I then addressed the RCA using a 6 [place] JR4 guide catheter, a 0.14 Prowater guidewire.  I direct stented this with a 3.5 mm x 24 mm long Synergy drug-eluting stent deployed at 16 atm.  I postdilated with a 4 mm x 15 mm balloon but was unable to pass the proximal struts and therefore used a second prowater for "buddy wire technique which allowed the post dilatation balloon to easily track into the stented segment post dilating the entire stent at 14 to 16 atm resulting reduction of an 80% proximal dominant RCA stenosis to 0% residual.  The patient tolerated procedure well.  He was sedated and ventilated throughout the case. The patient's LVEDP was 11 and his wedge was 15 suggesting that he was potentially mildly hypovolemic and did respond to a fluid bolus.   Successful mid to distal circumflex stenting which was the "culprit lesion in the setting of witnessed VF arrest post Covid vaccine administration.  I wonder whether this was an anaphylactic reaction creating hypotension and acute coronary artery thrombosis in the setting of three-vessel disease.  He has successful RCA intervention as well.  He will need staged LAD diagonal branch bifurcation intervention using orbital atherectomy given the calcific nature of the disease assuming that he neurologically recovers.  I spoken to PCCM and they are aware of the patient and will manage his vent.  We will discuss whether he needs Arctic sun, systemic cooling. Quay Burow. MD, [facility] 08/09/2019 8:49 PM   Portable Chest xray  Result Date: 08/10/2019 CLINICAL DATA:  Status post catheterization, intubated EXAM: PORTABLE CHEST 1 VIEW COMPARISON:  08/09/2019 FINDINGS: Two frontal views of the chest demonstrates endotracheal tube overlying tracheal air column tip at level of thoracic inlet. Enteric catheter coils over the gastric antrum. Central venous catheter from an inferior approach, wedged within the periphery of the right pulmonary artery. There is  central vascular congestion. Consolidation at the right lung base with associated right-sided volume loss compatible with atelectasis. There may be a small right pleural effusion. No pneumothorax. IMPRESSION: 1. Right-sided volume loss with right basilar consolidation, favor atelectasis. 2. Central vascular congestion. 3. Support devices as above, with flow directed central venous catheter wedged within the periphery of the right pulmonary artery. Electronically Signed   By: Randa Ngo M.D.   On: 08/10/2019 00:50   DG Chest Port 1 View  Result Date: 08/09/2019 CLINICAL DATA:  69 year old male status post CPR. EXAM: PORTABLE CHEST 1 VIEW COMPARISON:  None. FINDINGS: Endotracheal tube with tip approximately 6 cm above the carina. Enteric tube extends below the diaphragm with tip beyond the inferior margin of the image. There is cardiomegaly with vascular congestion. No consolidative changes. There is no pleural effusion or pneumothorax. No acute osseous pathology. IMPRESSION: 1. Endotracheal tube above the carina. 2. Cardiomegaly with vascular congestion. Electronically Signed   By: Anner Crete M.D.   On: 08/09/2019 18:53      Medications:     Current Medications: . aspirin  81 mg Oral Daily  . atorvastatin  80 mg Oral q1800  . chlorhexidine gluconate (MEDLINE KIT)  15 mL Mouth Rinse BID  . Chlorhexidine Gluconate Cloth  6 each Topical Daily  . furosemide  80 mg Intravenous BID  . insulin aspart  0-15 Units Subcutaneous Q4H  . mouth rinse  15 mL Mouth Rinse 10 times per day  . pantoprazole (PROTONIX) IV  40 mg Intravenous Daily  . sodium chloride flush  10-40 mL Intracatheter Q12H  . sodium chloride flush  3 mL Intravenous Q12H  . ticagrelor  90 mg Oral BID     Infusions: . sodium chloride    . ampicillin-sulbactam (UNASYN) IV Stopped (08/10/19 0422)  . lactated ringers    . magnesium sulfate bolus IVPB    . milrinone    . norepinephrine (LEVOPHED) Adult infusion 2 mcg/min  (08/10/19 0700)  . propofol (DIPRIVAN) infusion 25 mcg/kg/min (08/10/19 0700)      Assessment/Plan   1. Cardiac arrest: Out of hospital witnessed arrest after getting COVID vaccination. Immediate CPR, time to ROSC 20 minutes with 3 shocks. He is awake and follows commands, was not cooled.  I agree with Dr. Ellyn Hack that this is unlikely to have been an anaphylactic vaccination reaction, suspect he could have had a vagal response to the shot triggering ACS/plaque rupture.  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  No further ventricular arrhythmias overnight.  - Now s/p PCI, would assess EF at 3 months to determine ICD need if no further arrhythmias noted post-PCI.  - Start beta blocker when stable.  2. CAD: Cath as above, now s/p PCI to RCA and PCI to mid LCx into PLOM.  PLOM occlusion was culprit for event.  He has residual complex disease with 75% proximal LAD stenosis adjacent to ostial 95% D1 (moderate diagonal). - Continue ASA/ticagrelor.  - Continue statin.  - After recovery from this event, should have intervention on LAD system.  3. Cardiogenic shock: Post-MI, required norepinephrine.  Now able to titrate down.  CXR with pulmonary edema (and suspected aspiration on right).  Filling pressures mildly elevated.  Cardiac index 1.6 from [place].  - Norepinephrine at 2, can titrate off as able.  - Would use milrinone 0.25 mcg/kg/min for now with low output.  - Gentle diuresis, Lasix 80 mg IV bid for now to facilitate extubation.  Suspect he will not need a lot of diuresis.  - Needs echo this morning to assess LV function.  4. Possible aspiration pneumonitis: Covering with Unasyn.  5. Acute hypoxemic respiratory failure: CXR with pulmonary edema (and frothy sputum), also with possible aspiration right lung.  - Diuresis as above. - Continue Unasyn as above.  - CCM managing vent, may be able to extubate today.  6. Neuro: He can follow commands.    Length of Stay: 1  Loralie Champagne, MD    08/10/2019, 8:09 AM  Advanced Heart Failure Team Pager [phone] (M-F; 7a - 4p)  Please contact [facility] for night-coverage after hours (4p -7a ) and weekends on [email or web address]  Echo reviewed, LV EF preserved (looks around 55%) with good RV function and small IVC.  He diuresed well with 1 dose of Lasix 80 mg IV. CVP down to around 7. Repeat cardiac index excellent at 2.6 thermodilution.  - Stop milrinone now.  - Can wean norepinephrine off (only on 2 currently).  - Would hold additional Lasix for now and dose based on CVP.  - Should be able to extubate today.   Loralie Champagne 08/10/2019 11:22 AM

## 2019-08-10 NOTE — Progress Notes (Signed)
Right femoral arterial sheath pulled at 2147 with [facility]. Pressure was held for 20 minutes with no complications; distal pulses were felt. Venous sheath and [place]y Councilman was subsequently removed and pressure was held. No complications.   Patient was educated prior to sheath removals and had no further questions.

## 2019-08-10 NOTE — Progress Notes (Signed)
[place] [facility] Progress Note Patient Name: John Love DOB: 1951-01-07 MRN: [id number]   Date of Service  08/10/2019  HPI/Events of Note  Request to review CXR for NGT placement. NGT tip in mid to distal stomach.   eICU Interventions  OK to use NGT.     Intervention Category Major Interventions: Other:  Lysle Dingwall 08/10/2019, 1:43 AM

## 2019-08-10 NOTE — Progress Notes (Signed)
[facility] Progress Note Patient Name: John Love DOB: [birth year] MRN: [id number]   Date of Service  08/10/2019  HPI/Events of Note  ABG on 60%/PRVC 24/TV 620/P 5 = 7.396/35.7/53.0. CXR with pulmonary congestion.  eICU Interventions  Will order: 1. Increase PEEP to 8.      Intervention Category Major Interventions: Hypoxemia - evaluation and management;Respiratory failure - evaluation and management  Gale Hulse Cornelia Copa 08/10/2019, 12:21 AM

## 2019-08-10 NOTE — Progress Notes (Signed)
Verbal orders received from Dr Ellyn Hack to remove Swan-Ganz PA catheter as well as arterial and venous femoral sheaths.

## 2019-08-11 ENCOUNTER — Inpatient Hospital Stay (HOSPITAL_COMMUNITY): Payer: BC Managed Care – PPO

## 2019-08-11 DIAGNOSIS — N3289 Other specified disorders of bladder: Secondary | ICD-10-CM | POA: Diagnosis not present

## 2019-08-11 DIAGNOSIS — K5790 Diverticulosis of intestine, part unspecified, without perforation or abscess without bleeding: Secondary | ICD-10-CM | POA: Diagnosis not present

## 2019-08-11 DIAGNOSIS — R310 Gross hematuria: Secondary | ICD-10-CM | POA: Diagnosis not present

## 2019-08-11 DIAGNOSIS — I4901 Ventricular fibrillation: Secondary | ICD-10-CM | POA: Diagnosis not present

## 2019-08-11 DIAGNOSIS — R319 Hematuria, unspecified: Secondary | ICD-10-CM | POA: Diagnosis not present

## 2019-08-11 DIAGNOSIS — I2510 Atherosclerotic heart disease of native coronary artery without angina pectoris: Secondary | ICD-10-CM | POA: Diagnosis not present

## 2019-08-11 DIAGNOSIS — I469 Cardiac arrest, cause unspecified: Secondary | ICD-10-CM | POA: Diagnosis not present

## 2019-08-11 DIAGNOSIS — I509 Heart failure, unspecified: Secondary | ICD-10-CM | POA: Diagnosis not present

## 2019-08-11 LAB — BASIC METABOLIC PANEL
Anion gap: 10 (ref 5–15)
BUN: 15 mg/dL (ref 8–23)
CO2: 26 mmol/L (ref 22–32)
Calcium: 8.3 mg/dL — ABNORMAL LOW (ref 8.9–10.3)
Chloride: 102 mmol/L (ref 98–111)
Creatinine, Ser: 1.22 mg/dL (ref 0.61–1.24)
GFR calc Af Amer: >60 mL/min (ref >60)
GFR calc non Af Amer: >60 mL/min (ref >60)
Glucose, Bld: 103 mg/dL — ABNORMAL HIGH (ref 70–99)
Potassium: 4.2 mmol/L (ref 3.5–5.1)
Sodium: 138 mmol/L (ref 135–145)

## 2019-08-11 LAB — GLUCOSE, CAPILLARY
Glucose-Capillary: 94 mg/dL (ref 70–99)
Glucose-Capillary: 100 mg/dL — ABNORMAL HIGH (ref 70–99)
Glucose-Capillary: 102 mg/dL — ABNORMAL HIGH (ref 70–99)
Glucose-Capillary: 96 mg/dL (ref 70–99)
Glucose-Capillary: 95 mg/dL (ref 70–99)
Glucose-Capillary: 103 mg/dL — ABNORMAL HIGH (ref 70–99)

## 2019-08-11 LAB — PHOSPHORUS: Phosphorus: 3.1 mg/dL (ref 2.5–4.6)

## 2019-08-11 MED ORDER — TAMSULOSIN HCL 0.4 MG PO CAPS
0.4000 mg | ORAL_CAPSULE | Freq: Every day | ORAL | Status: DC
  Administered 2019-08-12 – 2019-08-18 (×7): 0.4 mg via ORAL
  Filled 2019-08-11 (×6): qty 1

## 2019-08-11 MED ORDER — SODIUM CHLORIDE 0.9 % IR SOLN: 3000.0000 mL | Status: DC

## 2019-08-11 MED ORDER — AMOXICILLIN-POT CLAVULANATE 875-125 MG PO TABS
1.0000 | ORAL_TABLET | Freq: Two times a day (BID) | ORAL | Status: AC
  Administered 2019-08-11 – 2019-08-13 (×6): 1 via ORAL
  Filled 2019-08-11 (×6): qty 1

## 2019-08-11 MED ORDER — RAMIPRIL 2.5 MG PO CAPS
2.5000 mg | ORAL_CAPSULE | Freq: Every day | ORAL | Status: DC
  Administered 2019-08-11 – 2019-08-19 (×9): 2.5 mg via ORAL
  Filled 2019-08-11 (×9): qty 1

## 2019-08-11 MED ORDER — CARVEDILOL 3.125 MG PO TABS
3.1250 mg | ORAL_TABLET | Freq: Two times a day (BID) | ORAL | Status: DC
  Administered 2019-08-11 – 2019-08-12 (×4): 3.125 mg via ORAL
  Filled 2019-08-11 (×4): qty 1

## 2019-08-11 NOTE — Progress Notes (Signed)
Dr.Estrich in place 20 FR Coude catheter without diffulculty. He removed the irrigant catheter and irrigated bladder with new catheter his urine cleared within 3 flushes returned without clots light pink urine. He now has coude catheter to BSD bag drain. Urologist reported would be back to check on catheter if he was not draining well would place 3 way f/c and do bladder irrigations but wanted to hold off on bladder irrigations this time. Patient tolerated procedure well.

## 2019-08-11 NOTE — Progress Notes (Signed)
CCMD called and relayed that patient had a 4.4 sec run of SVTs. Patient is alert and oriented x 4 sitting in the recliner. Wife is at the bedside, will continue to monitor.

## 2019-08-11 NOTE — Progress Notes (Signed)
NAME:  John Love, MRN:  [id number], DOB:  1951/02/11, LOS: 2 ADMISSION DATE:  08/09/2019, CONSULTATION DATE:  08/09/19 REFERRING MD:  Ellyn Hack  CHIEF COMPLAINT:  Cardiac Arrest   Brief History   John Love is a 69 y.o. male who was admitted 2/23 after V.fib arrest following his 2nd COVID vaccine.  History of present illness    John Love is a 69 y.o. male who has unknown PMH.  He presented to [facility] ED 2/23 with V.fib arrest.  He had received his 2nd COVID vaccine and during his post vaccine 15 minute observation period, he was noted to have seizure like activity before becoming unresponsive. CPR was started and upon AED placement, shock was advised for V.fib.  He required 6 shocks and 450 amio before ROSC.  Total downtime was 20 minutes.  He was started on an epi infusion and transported to ED.  In ED, it was initially felt that he did have purposeful movement.  King airway was replaced with ETT and there was evidence of aspiration.    He was found to have abnormal EKG; therefore, he was taken to cath lab.  In cath lab, he was found to have prox RCA lesion80% stenosed, Ost RCA to prox RCA lesion 50% stenosed, 1st diag lesion 95% stenosed, prox LAD to mid LAD lesion 75% stenosed, mid Cx to dist Cx lesion 80% stenosed.  He had a stent placed to RCA and Cx.  PCCM asked to admit to ICU.  Upon arrival to ICU, he is awake on vent despite propofol and is able to follow all basic commands.  Past Medical History  has Cardiac arrest with ventricular fibrillation (HCC) -> 20 minutes CPR with ROSC; Sustained ventricular fibrillation ([place]); Cardiogenic shock (HCC)-borderline; Respiratory failure ([place]); AKI (acute kidney injury) ([place]); Multiple vessel coronary artery disease; and CAD S/P percutaneous coronary angioplasty on their problem list.  [place] [facility] Events   2/23 > admit.  Consults:  Cardiology.  Procedures:  ETT 2/23 >  R fem swan 2/23 >    Significant Diagnostic Tests:   CXR 2/23 > vascular congestion. Cath 2/23 > prox RCA lesion80% stenosed, Ost RCA to prox RCA lesion 50% stenosed, 1st diag lesion 95% stenosed, prox LAD to mid LAD lesion 75% stenosed, mid Cx to dist Cx lesion 80% stenosed.  He had a stent placed to RCA and Cx. Echo 2/24 >   Micro Data:  Flu 2/23 > neg. COVID 2/23 > neg.  Antimicrobials:  Unasyn 2/23 >    Interim history/subjective:  Successfully extubated yesterday.  No voiced complaints today.  Denies dyspnea.  States the chest is sore.  Objective:  Blood pressure (!) 179/90, pulse 87, temperature (!) 97.5 F (36.4 C), temperature source Oral, resp. rate 18, height 6' (1.829 m), weight 102 kg, SpO2 94 %. PAP: (33-53)/(14-28) 38/18 CVP:  [6 mmHg-26 mmHg] 12 mmHg CO:  [6.6 L/min-7.6 L/min] 7.6 L/min CI:  [2.9 L/min/m2-3.4 L/min/m2] 3.4 L/min/m2  FiO2 (%):  [36 %] 36 %   Intake/Output Summary (Last 24 hours) at 08/11/2019 1422 Last data filed at 08/11/2019 1000 Gross per 24 hour  Intake 419.08 ml  Output 2010 ml  Net -1590.92 ml   Filed Weights   08/09/19 1814 08/09/19 2200  Weight: 117.9 kg 102 kg    Examination: General: Adult male, in NAD. Neuro: Neurologically intact.  Communicates appropriately. HEENT: /AT. Sclerae anicteric.  Cardiovascular: RRR, no M/R/G.  No JVD no rub. Lungs: Respirations even and unlabored.  Rhonchi heard to right side. Abdomen: BS x 4, soft, NT/ND.  Musculoskeletal: No gross deformities, no edema.  Skin: Intact, warm, no rashes. GU: Gross hematuria from traumatic Foley insertion   Assessment & Plan:   V.Fib arrest - presumed due to underlying RCA and Cx lesions then exacerbated by possible hypotension post COVID shot.  S/p cardiac cath 2/23 with stents placed to RCA and Cx. - Continue Post-MI care -Ready for transition to floor.  Acute hypoxic respiratory failure due to aspiration -resolved -Progressive ambulation.  Probable aspiration. -Complete further 3 days of Augmentin.  AKI  has resolved - LR @ 75. - Follow BMP.   Gross hematuria from traumatic Foley insertion on antiplatelet agents. -Urology consultation.  Best Practice:  Diet: Advance diet Pain/Anxiety/Delirium protocol (if indicated): Tylenol for pain  VAP protocol (if indicated): In place. DVT prophylaxis: SCD's. GI prophylaxis: Not indicated Glucose control: No history of diabetes Mobility: Phase 1 cardiac rehabilitation Code Status: Full. Family Communication: Wife updated at bedside. Disposition: Ready to transfer to floor.  Labs   CBC: Recent Labs  Lab 08/09/19 1807 08/09/19 1900 08/09/19 2202 08/10/19 0612  WBC 13.9*  --   --  12.4*  NEUTROABS 6.8  --   --   --   HGB 14.5 13.6 15.0 13.5  HCT 48.3 40.0 44.0 40.8  MCV 83.9  --   --  75.1*  PLT 289  --   --  956   Basic Metabolic Panel: Recent Labs  Lab 08/09/19 1807 08/09/19 1900 08/09/19 2202 08/10/19 0426 08/11/19 0628  NA 135 141 138 136 138  K 3.8 4.7 3.8 4.2 4.2  CL 99 108  --  103 102  CO2 16*  --   --  23 26  GLUCOSE 301* 193*  --  127* 103*  BUN 8 8  --  13 15  CREATININE 1.25* 0.70  --  1.21 1.22  CALCIUM 8.7*  --   --  8.2* 8.3*  MG  --   --   --  1.9  --   PHOS  --   --   --  2.0* 3.1   GFR: Estimated Creatinine Clearance: 72.6 mL/min (by C-G formula based on SCr of 1.22 mg/dL). Recent Labs  Lab 08/09/19 1807 08/10/19 0612 08/10/19 0820  PROCALCITON  --   --  18.14  WBC 13.9* 12.4*  --    Liver Function Tests: Recent Labs  Lab 08/09/19 1807  AST 98*  ALT 71*  ALKPHOS 74  BILITOT 0.3  PROT 6.3*  ALBUMIN 2.7*   No results for input(s): LIPASE, AMYLASE in the last 168 hours. No results for input(s): AMMONIA in the last 168 hours. ABG    Component Value Date/Time   PHART 7.396 08/09/2019 2202   PCO2ART 35.7 08/09/2019 2202   PO2ART 53.0 (L) 08/09/2019 2202   HCO3 22.0 08/09/2019 2202   TCO2 23 08/09/2019 2202   ACIDBASEDEF 2.0 08/09/2019 2202   O2SAT 58.9 08/10/2019 0433    Coagulation  Profile: Recent Labs  Lab 08/09/19 1807  INR 1.3*   Cardiac Enzymes: No results for input(s): CKTOTAL, CKMB, CKMBINDEX, TROPONINI in the last 168 hours. HbA1C: Hgb A1c MFr Bld  Date/Time Value Ref Range Status  08/09/2019 06:07 PM 6.3 (H) 4.8 - 5.6 % Final    Comment:    (NOTE) Pre diabetes:          5.7%-6.4% Diabetes:              >  6.4% Glycemic control for   <7.0% adults with diabetes    CBG: Recent Labs  Lab 08/10/19 2335 08/11/19 0341 08/11/19 0820 08/11/19 1125 08/11/19 [place], MD [facility] ICU Physician [place]  Pager: [phone] Mobile: [phone] After hours: [phone].   08/11/2019, 2:22 PM

## 2019-08-11 NOTE — Progress Notes (Signed)
CCMD called and relayed that patient had a 16 sec run of SVTs. Patient is alert and oriented x 4. Denies any pain and readjusted himself in the recliner. Wife is still at the bedside. Will continue to monitor.

## 2019-08-11 NOTE — Consult Note (Signed)
Urology Consult   Physician requesting consult: Kipp Brood  Reason for consult: Hematuria  History of Present Illness: John Love is a 69 y.o. male with no PMH who was admitted to [facility] on 2/23 after a v fib arrest following his second covid vaccine. He subsequently had stents placed in the RCA and Cx.  Pt was started on ASA and ticagrelor after stenting.   His foley cath was placed without difficulty at admission and he was noted to have gross hematuria yesterday which has persisted.   Pt states he does not have a PCP and has not received routine care.  He did have a rectal exam and PSA checked approx 3 years ago at a free clinic and states he was told it was normal. He has urinary frequency but denies hx of straining, incontinence, hematuria, dysuria, and feelings of incomplete emptying. He has never been evaled by a urologist or had any urologic surgery/intervention. He smokes 1 1/2 ppd and has for over 50 years.  He drinks a 6 pack of beer daily and smokes marijuana 2-3 times per month.   He is currently resting comfortably and denies F/C, HA, CP, SOB, N/V, diarrhea/constipation, and abdominal pain. Foley is draining well. H/H 13.5/40.8 08/10/19 and Cr 1.22 08/11/19.  CT A/P reveals marked bladder wall thickening and prostatomegaly without hydro or stones.     Past Medical History:  Diagnosis Date  . Cardiac arrest with ventricular fibrillation (HCC) -> 20 minutes CPR with ROSC 08/09/2019  . Multiple vessel coronary artery disease 08/10/2019   Heart Cath -PCI 08/09/2019:  CULPRIT LESION: Mid Cx to Dist Cx lesion is 80% stenosed. 3rd Mrg lesion is 100% stenosed at small branch take-off. DES PCI: 2.25 mm x 28 mm Synergy DES (proximal postdilated 2.75 mm.  Post intervention, there is a 0% residual stenosis.  Small side branch was occluded Ost RCA to Prox RCA lesion is 50% stenosed.Prox RCA lesion is 80% stenosed.  DES PCI: 3.5 mm x 24 mm Sy      Current Hospital Medications:  Home  Meds:  . Current Meds  Medication Sig  . aspirin EC 81 MG tablet Take 81 mg by mouth daily.  . Multiple Vitamin (MULTIVITAMIN ADULT PO) Take 2 tablets by mouth daily.     Scheduled Meds: . amoxicillin-clavulanate  1 tablet Oral Q12H  . aspirin  81 mg Oral Daily  . atorvastatin  80 mg Oral q1800  . carvedilol  3.125 mg Oral BID WC  . Chlorhexidine Gluconate Cloth  6 each Topical Daily  . enoxaparin (LOVENOX) injection  40 mg Subcutaneous Daily  . insulin aspart  0-15 Units Subcutaneous Q4H  . ramipril  2.5 mg Oral Daily  . sodium chloride flush  10-40 mL Intracatheter Q12H  . sodium chloride flush  3 mL Intravenous Q12H  . ticagrelor  90 mg Oral BID   Continuous Infusions: . sodium chloride    . sodium chloride irrigation     PRN Meds:.sodium chloride, acetaminophen, ondansetron (ZOFRAN) IV, sodium chloride flush, sodium chloride flush  Allergies: No Known Allergies  No family history on file.  Social History:  has no history on file for tobacco, alcohol, and drug.  ROS: A complete review of systems was performed.  All systems are negative except for pertinent findings as noted.  Physical Exam:  Vital signs in last 24 hours: Temp:  [97.5 F (36.4 C)-99.7 F (37.6 C)] 97.5 F (36.4 C) (02/25 1145) Pulse Rate:  [87-97] 87 (02/25 1145)  Resp:  [17-29] 18 (02/25 1145) BP: (109-181)/(68-118) 179/90 (02/25 1145) SpO2:  [94 %-100 %] 94 % (02/25 1145) Arterial Line BP: (121-158)/(65-81) 138/71 (02/24 2100) FiO2 (%):  [36 %] 36 % (02/24 1428) Constitutional:  Alert and oriented, No acute distress Cardiovascular: Regular rate and rhythm Respiratory: Normal respiratory effort GI: Abdomen is soft, nontender, nondistended, no abdominal masses GU: 48ffoley in place with bright red bloody urine in bag Lymphatic: No lymphadenopathy Neurologic: Grossly intact, no focal deficits Psychiatric: Normal mood and affect  Laboratory Data:  Recent Labs    08/09/19 1807  08/09/19 1900 08/09/19 2202 08/10/19 0612  WBC 13.9*  --   --  12.4*  HGB 14.5 13.6 15.0 13.5  HCT 48.3 40.0 44.0 40.8  PLT 289  --   --  316    Recent Labs    08/09/19 1807 08/09/19 1900 08/09/19 2202 08/10/19 0426 08/11/19 0628  NA 135 141 138 136 138  K 3.8 4.7 3.8 4.2 4.2  CL 99 108  --  103 102  GLUCOSE 301* 193*  --  127* 103*  BUN 8 8  --  13 15  CALCIUM 8.7*  --   --  8.2* 8.3*  CREATININE 1.25* 0.70  --  1.21 1.22     Results for orders placed or performed during the hospital encounter of 08/09/19 (from the past 24 hour(s))  Troponin I (High Sensitivity)     Status: Abnormal   Collection Time: 08/10/19  2:36 PM  Result Value Ref Range   Troponin I (High Sensitivity) 2,911 (HH) <18 ng/L  Glucose, capillary     Status: None   Collection Time: 08/10/19  4:09 PM  Result Value Ref Range   Glucose-Capillary 91 70 - 99 mg/dL  Glucose, capillary     Status: None   Collection Time: 08/10/19  7:43 PM  Result Value Ref Range   Glucose-Capillary 97 70 - 99 mg/dL  POCT Activated clotting time     Status: None   Collection Time: 08/10/19  9:28 PM  Result Value Ref Range   Activated Clotting Time 142 seconds  Glucose, capillary     Status: None   Collection Time: 08/10/19 11:35 PM  Result Value Ref Range   Glucose-Capillary 93 70 - 99 mg/dL  Glucose, capillary     Status: None   Collection Time: 08/11/19  3:41 AM  Result Value Ref Range   Glucose-Capillary 94 70 - 99 mg/dL  Phosphorus     Status: None   Collection Time: 08/11/19  6:28 AM  Result Value Ref Range   Phosphorus 3.1 2.5 - 4.6 mg/dL  Basic metabolic panel     Status: Abnormal   Collection Time: 08/11/19  6:28 AM  Result Value Ref Range   Sodium 138 135 - 145 mmol/L   Potassium 4.2 3.5 - 5.1 mmol/L   Chloride 102 98 - 111 mmol/L   CO2 26 22 - 32 mmol/L   Glucose, Bld 103 (H) 70 - 99 mg/dL   BUN 15 8 - 23 mg/dL   Creatinine, Ser 1.22 0.61 - 1.24 mg/dL   Calcium 8.3 (L) 8.9 - 10.3 mg/dL   GFR calc  non Af Amer >60 >60 mL/min   GFR calc Af Amer >60 >60 mL/min   Anion gap 10 5 - 15  Glucose, capillary     Status: Abnormal   Collection Time: 08/11/19  8:20 AM  Result Value Ref Range   Glucose-Capillary 100 (H) 70 - 99  mg/dL  Glucose, capillary     Status: Abnormal   Collection Time: 08/11/19 11:25 AM  Result Value Ref Range   Glucose-Capillary 102 (H) 70 - 99 mg/dL  Glucose, capillary     Status: None   Collection Time: 08/11/19 11:44 AM  Result Value Ref Range   Glucose-Capillary 96 70 - 99 mg/dL   Recent Results (from the past 240 hour(s))  Respiratory Panel by RT PCR (Flu A&B, Covid) - Nasopharyngeal Swab     Status: None   Collection Time: 08/09/19  6:01 PM   Specimen: Nasopharyngeal Swab  Result Value Ref Range Status   SARS Coronavirus 2 by RT PCR NEGATIVE NEGATIVE Final    Comment: (NOTE) SARS-CoV-2 target nucleic acids are NOT DETECTED. The SARS-CoV-2 RNA is generally detectable in upper respiratoy specimens during the acute phase of infection. The lowest concentration of SARS-CoV-2 viral copies this assay can detect is 131 copies/mL. A negative result does not preclude SARS-Cov-2 infection and should not be used as the sole basis for treatment or other patient management decisions. A negative result may occur with  improper specimen collection/handling, submission of specimen other than nasopharyngeal swab, presence of viral mutation(s) within the areas targeted by this assay, and inadequate number of viral copies (<131 copies/mL). A negative result must be combined with clinical observations, patient history, and epidemiological information. The expected result is Negative. Fact Sheet for Patients:  PinkCheek.be Fact Sheet for Healthcare Providers:  GravelBags.it This test is not yet ap proved or cleared by the [place] FDA and  has been authorized for detection and/or diagnosis of SARS-CoV-2 by FDA  under an Emergency Use Authorization (EUA). This EUA will remain  in effect (meaning this test can be used) for the duration of the COVID-19 declaration under Section 564(b)(1) of the Act, 21 U.S.C. section 360bbb-3(b)(1), unless the authorization is terminated or revoked sooner.    Influenza A by PCR NEGATIVE NEGATIVE Final   Influenza B by PCR NEGATIVE NEGATIVE Final    Comment: (NOTE) The Xpert Xpress SARS-CoV-2/FLU/RSV assay is intended as an aid in  the diagnosis of influenza from Nasopharyngeal swab specimens and  should not be used as a sole basis for treatment. Nasal washings and  aspirates are unacceptable for Xpert Xpress SARS-CoV-2/FLU/RSV  testing. Fact Sheet for Patients: PinkCheek.be Fact Sheet for Healthcare Providers: GravelBags.it This test is not yet approved or cleared by the [place] FDA and  has been authorized for detection and/or diagnosis of SARS-CoV-2 by  FDA under an Emergency Use Authorization (EUA). This EUA will remain  in effect (meaning this test can be used) for the duration of the  Covid-19 declaration under Section 564(b)(1) of the Act, 21  U.S.C. section 360bbb-3(b)(1), unless the authorization is  terminated or revoked. Performed at [facility], [place] [address], [place], [place] [address]   MRSA PCR Screening     Status: None   Collection Time: 08/09/19 10:23 PM   Specimen: Nasopharyngeal  Result Value Ref Range Status   MRSA by PCR NEGATIVE NEGATIVE Final    Comment:        The GeneXpert MRSA Assay (FDA approved for NASAL specimens only), is one component of a comprehensive MRSA colonization surveillance program. It is not intended to diagnose MRSA infection nor to guide or monitor treatment for MRSA infections. Performed at [facility], [place] [address], [place], [place] [address]     Renal Function: Recent Labs    08/09/19 1807 08/09/19  1900 08/10/19 0426  08/11/19 0628  CREATININE 1.25* 0.70 1.21 1.22   Estimated Creatinine Clearance: 72.6 mL/min (by C-G formula based on SCr of 1.22 mg/dL).  Radiologic Imaging: CT ABDOMEN PELVIS WO CONTRAST  Result Date: 08/11/2019 CLINICAL DATA:  Hematuria. Patient status post cardiac catheterization 08/09/2019. EXAM: CT ABDOMEN AND PELVIS WITHOUT CONTRAST TECHNIQUE: Multidetector CT imaging of the abdomen and pelvis was performed following the standard protocol without IV contrast. COMPARISON:  . single-view of the chest 08/10/2019 and 08/11/2019. FINDINGS: Lower chest: There is a small right pleural effusion and dense airspace disease in the posterior right lower lobe. Trace left effusion is also identified. Heart size is upper normal. No pericardial effusion. Hepatobiliary: There is contrast in the gallbladder from the patient's cardiac catheterization. A single punctate calcification in the posterior right hepatic lobe is incidentally noted. The liver is otherwise unremarkable. Biliary tree appears normal. Pancreas: Unremarkable. No pancreatic ductal dilatation or surrounding inflammatory changes. Spleen: Normal in size without focal abnormality. Adrenals/Urinary Tract: The adrenal glands appear normal. The kidneys also appear normal without stone or hydronephrosis. No renal mass on infused examination. A Foley catheter is in place in the urinary bladder with some associated air in the bladder. The bladder is decompressed and its walls are severely thickened. The degree of thickening is out of proportion to decompression. Stomach/Bowel: Stomach is within normal limits. Appendix appears normal. No evidence of bowel wall thickening, distention, or inflammatory changes. A few scattered colonic diverticula are noted. Vascular/Lymphatic: Aortic atherosclerosis. No enlarged abdominal or pelvic lymph nodes. Reproductive: Prostatomegaly. Other: None. Musculoskeletal: No acute or focal abnormality. IMPRESSION: Marked thickening  of the walls of the urinary bladder could be due to cystitis and/or bladder outlet obstruction in this patient with prostatomegaly. No other explanation for hematuria is present. Negative for urinary tract stone. Small right pleural effusion with dense airspace disease in the posterior right lower lobe which could be due to atelectasis or pneumonia. Scattered diverticulosis without diverticulitis. Atherosclerosis. Electronically Signed   By: Inge Rise M.D.   On: 08/11/2019 11:24   CARDIAC CATHETERIZATION  Result Date: 08/09/2019  Prox RCA lesion is 80% stenosed.  Ost RCA to Prox RCA lesion is 50% stenosed.  1st Diag lesion is 95% stenosed.  Prox LAD to Mid LAD lesion is 75% stenosed.  3rd Mrg lesion is 100% stenosed.  Mid Cx to Dist Cx lesion is 80% stenosed.  A stent was successfully placed.  Post intervention, there is a 0% residual stenosis.  A stent was successfully placed.  Post intervention, there is a 0% residual stenosis.  A drug-eluting stent was successfully placed.  Post intervention, there is a 0% residual stenosis.  Post intervention, there is a 0% residual stenosis.  John Love is a 69 y.o. male  [id number] LOCATION:  FACILITY: [facility] PHYSICIAN: Quay Burow, M.D. [birth year] DATE OF PROCEDURE:  08/09/2019 DATE OF DISCHARGE: CARDIAC CATHETERIZATION / PCI DES LCX/RCA/ RHC History obtained from chart review.  Mr. Lomax is a 69 year old moderately overweight African-American gentleman without prior cardiac history who apparently had his Covid vaccine shot early evening and had witnessed cardiac arrest after that with ventricular fibrillation.  He had CPR defibrillation and ROSC and approxi-20 to 25 minutes with spontaneous movement.  He was combative, was intubated and sedated.  His EKG showed lateral ST segment depression with slight ST segment elevation in lead aVR.  He was evaluated by Dr. Ellyn Hack in the ER who felt that he should be brought up for emergent cardiac  catheterization.  Initially his blood pressure was in the systolic 80 range and he was placed on IV Levophed. PROCEDURE DESCRIPTION: The patient was brought to the second floor [facility] cath lab in the postabsorptive state. He was premedicated with IV Versed, fentanyl and propofol.. His right groin was prepped and shaved in usual sterile fashion. Xylocaine 1% was used for local anesthesia. A 6 French sheath was inserted into the right common femoral artery using standard Seldinger technique.  An 8 French sheath was inserted into the right common femoral vein.  5 French right left second Silastic catheters on 5 French pigtail catheter used for selective coronary angiography, and left ventriculography using "a hand shot with obtaining left heart pressures.  Isovue dye is used for the entirety of the case.  Retrograde aorta, ventricular pullback pressures were recorded.  A VIP 7.5 French Swan-Ganz catheter was carefully maneuvered into the pulmonary artery obtaining sequential pressures. The circumflex was most likely the "culprit lesion" and this was initially addressed.  It was decided to leave the LAD diagonal branch bifurcation alone since there was TIMI-3 flow and this was a heavy calcified vessel and to proceed with RCA intervention should the circumflex go without complication.  The patient received Angiomax bolus followed by infusion with a ACT of greater than 300.  Because I was unable to give ticagrelor p.o. or via OG because of uncertainty of placement at bolus the patient with Cangrelor and placed him on a Cangrelor drip.  Isovue dye was used for the entirety of the intervention.  Retroaortic pressures monitored in the case. The circumflex was initially addressed using a 6 French XB 3.5 cm guide catheter.  A 0.14 Prowater guidewire was used to cross the distal circumflex obtuse marginal branch lesion which was predilated with a 2 mm x 12 mm balloon.  There was a 60 to 70% lesion upstream from  this.  I then placed a 2.25 x 28 mm long Synergy drug-eluting stent across both lesions and deployed at 16 atm.  I postdilated the proximal segment of the tube with a 3.5 mm x 24 mill meter long Synergy drug-eluting stent deploying at 16 atm.  I postdilated with a 4 mm x 15 mm noncompliant balloon but was unable to originally with a 2.7 0.5 x 12 mm long noncompliant balloon.  There was some spasm downstream which resolved with intracoronary glycerin and proximal postdilatation with excellent result. The patient was moderately hyper hypotensive during the procedure and received a 500 cc fluid bolus and went and had his Levophed infusion rate adjusted appropriately. I then addressed the RCA using a 6 [place] JR4 guide catheter, a 0.14 Prowater guidewire.  I direct stented this with a 3.5 mm x 24 mm long Synergy drug-eluting stent deployed at 16 atm.  I postdilated with a 4 mm x 15 mm balloon but was unable to pass the proximal struts and therefore used a second prowater for "buddy wire technique which allowed the post dilatation balloon to easily track into the stented segment post dilating the entire stent at 14 to 16 atm resulting reduction of an 80% proximal dominant RCA stenosis to 0% residual.  The patient tolerated procedure well.  He was sedated and ventilated throughout the case. The patient's LVEDP was 11 and his wedge was 15 suggesting that he was potentially mildly hypovolemic and did respond to a fluid bolus.   Successful mid to distal circumflex stenting which was the "culprit lesion in the setting of witnessed VF  arrest post Covid vaccine administration.  I wonder whether this was an anaphylactic reaction creating hypotension and acute coronary artery thrombosis in the setting of three-vessel disease.  He has successful RCA intervention as well.  He will need staged LAD diagonal branch bifurcation intervention using orbital atherectomy given the calcific nature of the disease assuming that he  neurologically recovers.  I spoken to PCCM and they are aware of the patient and will manage his vent.  We will discuss whether he needs Arctic sun, systemic cooling. Quay Burow. MD, [facility] 08/09/2019 8:49 PM   DG CHEST PORT 1 VIEW  Result Date: 08/11/2019 CLINICAL DATA:  Congestive heart failure. Shortness of breath. EXAM: PORTABLE CHEST 1 VIEW COMPARISON:  08/10/2019 and 04/07/2020 FINDINGS: The endotracheal tube and Swan-Ganz catheter and NG tube have been removed. Heart size and pulmonary vascularity are normal. Bilateral pulmonary edema has resolved. There is persistent atelectasis or consolidation at the right lung base medially. Aortic atherosclerosis. No acute bone abnormality. IMPRESSION: Resolution of pulmonary edema. Persistent atelectasis or consolidation at the right lung base medially. Electronically Signed   By: Lorriane Shire M.D.   On: 08/11/2019 09:33   DG Chest Port 1 View  Result Date: 08/10/2019 CLINICAL DATA:  Respiratory failure. EXAM: PORTABLE CHEST 1 VIEW COMPARISON:  Chest x-ray 08/10/2019 FINDINGS: The endotracheal tube is 5.5 cm above the carina. The NG tube is coursing down the esophagus and into the stomach. The Swan-Ganz catheter tip is in the right pulmonary artery. The cardiac silhouette, mediastinal and hilar contours are stable. Persistent asymmetric interstitial and airspace process in the lungs but improved aeration since the prior study. No definite pleural effusions or pneumothorax. IMPRESSION: 1. Stable support apparatus. 2. Improving lung aeration but persistent asymmetric interstitial and airspace process. Electronically Signed   By: Marijo Sanes M.D.   On: 08/10/2019 09:46   Portable Chest xray  Result Date: 08/10/2019 CLINICAL DATA:  Status post catheterization, intubated EXAM: PORTABLE CHEST 1 VIEW COMPARISON:  08/09/2019 FINDINGS: Two frontal views of the chest demonstrates endotracheal tube overlying tracheal air column tip at level of thoracic inlet.  Enteric catheter coils over the gastric antrum. Central venous catheter from an inferior approach, wedged within the periphery of the right pulmonary artery. There is central vascular congestion. Consolidation at the right lung base with associated right-sided volume loss compatible with atelectasis. There may be a small right pleural effusion. No pneumothorax. IMPRESSION: 1. Right-sided volume loss with right basilar consolidation, favor atelectasis. 2. Central vascular congestion. 3. Support devices as above, with flow directed central venous catheter wedged within the periphery of the right pulmonary artery. Electronically Signed   By: Randa Ngo M.D.   On: 08/10/2019 00:50   DG Chest Port 1 View  Result Date: 08/09/2019 CLINICAL DATA:  69 year old male status post CPR. EXAM: PORTABLE CHEST 1 VIEW COMPARISON:  None. FINDINGS: Endotracheal tube with tip approximately 6 cm above the carina. Enteric tube extends below the diaphragm with tip beyond the inferior margin of the image. There is cardiomegaly with vascular congestion. No consolidative changes. There is no pleural effusion or pneumothorax. No acute osseous pathology. IMPRESSION: 1. Endotracheal tube above the carina. 2. Cardiomegaly with vascular congestion. Electronically Signed   By: Anner Crete M.D.   On: 08/09/2019 18:53   ECHOCARDIOGRAM COMPLETE  Result Date: 08/10/2019    ECHOCARDIOGRAM REPORT   Patient Name:   John Love Date of Exam: 08/10/2019 Medical Rec #:  [id number]       Height:  72.0 in Accession #:    [id number]      Weight:       224.9 lb Date of Birth:  04-08-1951      BSA:          2.239 m Patient Age:    68 years        BP:           142/72 mmHg Patient Gender: M               HR:           90 bpm. Exam Location:  Inpatient Procedure: 2D Echo, Cardiac Doppler and Color Doppler Indications:    I49.9* Cardiac arrhythmia, unspecified. Cardiac arrest.  History:        Patient has no prior history of Echocardiogram  examinations.                 Arrythmias:Cardiac Arrest and Ventricular Fibrillation. Cardiac                 shock. Respiratory failure.  Sonographer:    Roseanna Rainbow RDCS Referring Phys: [id number] [facility]  Sonographer Comments: Technically difficult study due to poor echo windows, no parasternal window, suboptimal parasternal window, suboptimal apical window and echo performed with patient supine and on artificial respirator. IMPRESSIONS  1. Left ventricular ejection fraction, by estimation, is 55 to 60%. The left ventricle has normal function. The left ventricle demonstrates regional wall motion abnormalities (see scoring diagram/findings for description). There is mild concentric left ventricular hypertrophy. Left ventricular diastolic parameters are consistent with Grade I diastolic dysfunction (impaired relaxation).  2. Right ventricular systolic function is normal. The right ventricular size is normal.  3. The mitral valve is grossly normal. No evidence of mitral valve regurgitation. No evidence of mitral stenosis.  4. The aortic valve is grossly normal. Aortic valve regurgitation is not visualized. No aortic stenosis is present. Comparison(s): No prior Echocardiogram. FINDINGS  Left Ventricle: Left ventricular ejection fraction, by estimation, is 55 to 60%. The left ventricle has normal function. The left ventricle demonstrates regional wall motion abnormalities. The left ventricular internal cavity size was normal in size. There is mild concentric left ventricular hypertrophy. Left ventricular diastolic parameters are consistent with Grade I diastolic dysfunction (impaired relaxation). Normal left ventricular filling pressure.  LV Wall Scoring: The posterior wall is hypokinetic. Right Ventricle: The right ventricular size is normal. No increase in right ventricular wall thickness. Right ventricular systolic function is normal. Left Atrium: Left atrial size was normal in size. Right Atrium: Right atrial  size was normal in size. Pericardium: There is no evidence of pericardial effusion. Mitral Valve: The mitral valve is grossly normal. No evidence of mitral valve regurgitation. No evidence of mitral valve stenosis. Tricuspid Valve: The tricuspid valve is grossly normal. Tricuspid valve regurgitation is trivial. Aortic Valve: The aortic valve is grossly normal. Aortic valve regurgitation is not visualized. No aortic stenosis is present. Pulmonic Valve: The pulmonic valve was grossly normal. Pulmonic valve regurgitation is not visualized. Aorta: The aortic root is normal in size and structure. IAS/Shunts: No atrial level shunt detected by color flow Doppler. Additional Comments: A venous catheter is visualized in the right atrium and right ventricle.  LEFT VENTRICLE PLAX 2D LVIDd:         4.15 cm     Diastology LVIDs:         3.00 cm     LV e' lateral:   6.20 cm/s LV PW:  1.50 cm     LV E/e' lateral: 11.0 LV IVS:        1.45 cm     LV e' medial:    4.57 cm/s LVOT diam:     2.40 cm     LV E/e' medial:  14.9 LV SV:         59 LV SV Index:   26 LVOT Area:     4.52 cm  LV Volumes (MOD) LV vol d, MOD A2C: 47.8 ml LV vol d, MOD A4C: 65.0 ml LV vol s, MOD A2C: 25.2 ml LV vol s, MOD A4C: 28.9 ml LV SV MOD A2C:     22.6 ml LV SV MOD A4C:     65.0 ml LV SV MOD BP:      29.3 ml RIGHT VENTRICLE             IVC RV S prime:     14.00 cm/s  IVC diam: 1.50 cm TAPSE (M-mode): 1.2 cm LEFT ATRIUM           Index LA diam:      2.60 cm 1.16 cm/m LA Vol (A2C): 21.9 ml 9.78 ml/m LA Vol (A4C): 39.3 ml 17.55 ml/m  AORTIC VALVE LVOT Vmax:   93.80 cm/s LVOT Vmean:  59.600 cm/s LVOT VTI:    0.131 m  AORTA Ao Root diam: 2.70 cm MITRAL VALVE MV Area (PHT): 2.66 cm    SHUNTS MV Decel Time: 285 msec    Systemic VTI:  0.13 m MV E velocity: 67.90 cm/s  Systemic Diam: 2.40 cm MV A velocity: 75.20 cm/s MV E/A ratio:  0.90 Eleonore Chiquito MD Electronically signed by Eleonore Chiquito MD Signature Date/Time: 08/10/2019/1:33:50 PM    Final       Impression/Recommendation  Gross hematuria--likely due to prostate and/or bladder wall irritation from foley in pt on ASA and ticagrelor.  Foley was replaced and irrigated without difficulty by Dr. Junious Silk.  Maintain foley and irrigate prn to prevent clotting. As hematuria improves and he is more mobile he can have a void trial, however, if hematuria persists he will require cystoscopy.  He has at least some degree of BOO as evidenced on CT by thickened bladder wall and enlarged prostate.  Once pt has recovered from all of his current issues he will need a PSA and rectal exam.   Debbrah Alar 08/11/2019, 1:51 PM

## 2019-08-11 NOTE — Evaluation (Signed)
Occupational Therapy Evaluation Patient Details Name: John Love MRN: [id number] DOB: March 23, 1951 Today's Date: 08/11/2019    History of Present Illness Kasyn Rolph is a 69 y.o. male who was admitted 2/23 after V.fib arrest following his 2nd COVID vaccine. 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 OT evaluation completed before order discontinued from MD. Pt is able to complete bed mobility and sit <> stand with light min A, mostly to manage sternal pain from previous compressions. Educated pt on how to splint cough for comfort. Pt demonstrates ability to complete BADL at baseline level, only requires increased assist to occasionally manage sternal area pain. Per MD, pt no longer needing to be on OT caseload. P  tis at baseline per evaluation, OT will sign off. Thank you for this consult.     Follow Up Recommendations  No OT follow up    Equipment Recommendations  None recommended by OT    Recommendations for Other Services       Precautions / Restrictions Precautions Precautions: None Precaution Comments: watch BP Restrictions Weight Bearing Restrictions: No      Mobility Bed Mobility Overal bed mobility: Needs Assistance Bed Mobility: Supine to Sit     Supine to sit: Min assist     General bed mobility comments: for line management and to pull up at trunk due to sore chest  Transfers Overall transfer level: Needs assistance Equipment used: None Transfers: Sit to/from Stand Sit to Stand: Min assist;Min guard         General transfer comment: light min A- min guard. pt needing slight boost 2/2 to chest pain at sternum when bearing weight through UEs, but with min A from therapists this improved    Balance Overall balance assessment: Mild deficits observed, not formally tested                                         ADL either performed or assessed with clinical  judgement   ADL                                         General ADL Comments: Pt overall completing BADL at baseline level. He does need some increased assist for LB dressing 2/2 chest pain, but anticipate this to resolve and pt to return to independent. He showed ability to complete toilet transfer this date without phys assist     Vision Patient Visual Report: No change from baseline       Perception     Praxis      Pertinent Vitals/Pain Pain Assessment: Faces Faces Pain Scale: Hurts a little bit Pain Location: chest from rib/sternal pain Pain Descriptors / Indicators: Aching;Sore Pain Intervention(s): Monitored during session     Hand Dominance     Extremity/Trunk Assessment Upper Extremity Assessment Upper Extremity Assessment: Overall WFL for tasks assessed   Lower Extremity Assessment Lower Extremity Assessment: Defer to PT evaluation       Communication Communication Communication: No difficulties   Cognition Arousal/Alertness: Awake/alert Behavior During Therapy: WFL for tasks assessed/performed Overall Cognitive Status: Within Functional Limits for tasks assessed  General Comments       Exercises     Shoulder Instructions      Home Living Family/patient expects to be discharged to:: Private residence Living Arrangements: Spouse/significant other Available Help at Discharge: Family;Other (Comment)(wife still working) Type of Home: House Home Access: Stairs to enter Technical brewer of Steps: 3   Home Layout: One level     Bathroom Shower/Tub: Walk-in shower;Tub/shower unit   Bathroom Toilet: Standard     Home Equipment: None          Prior Functioning/Environment Level of Independence: Independent                 OT Problem List: Decreased knowledge of use of DME or AE;Decreased knowledge of precautions;Cardiopulmonary status limiting activity;Decreased  activity tolerance      OT Treatment/Interventions:      OT Goals(Current goals can be found in the care plan section) Acute Rehab OT Goals Patient Stated Goal: reduce pain and return home OT Goal Formulation: With patient Time For Goal Achievement: 08/25/19 Potential to Achieve Goals: Good  OT Frequency:     Barriers to D/C:            Co-evaluation PT/OT/SLP Co-Evaluation/Treatment: Yes Reason for Co-Treatment: For patient/therapist safety;To address functional/ADL transfers PT goals addressed during session: Mobility/safety with mobility;Proper use of DME;Strengthening/ROM OT goals addressed during session: ADL's and self-care;Strengthening/ROM      AM-PAC OT "6 Clicks" Daily Activity     Outcome Measure Help from another person eating meals?: None Help from another person taking care of personal grooming?: None Help from another person toileting, which includes using toliet, bedpan, or urinal?: None Help from another person bathing (including washing, rinsing, drying)?: None Help from another person to put on and taking off regular upper body clothing?: None Help from another person to put on and taking off regular lower body clothing?: None 6 Click Score: 24   End of Session Nurse Communication: Mobility status  Activity Tolerance: Patient tolerated treatment well Patient left: in chair;with call bell/phone within reach  OT Visit Diagnosis: Unsteadiness on feet (R26.81);Muscle weakness (generalized) (M62.81)                Time: 3845-3646 OT Time Calculation (min): 21 min Charges:  OT General Charges $OT Visit: 1 Visit OT Evaluation $OT Eval Moderate Complexity: [place], MSOT, OTR/L Acute Rehabilitation Services [facility] Office Number: [phone] Pager: [phone]  Zenovia Jarred 08/11/2019, 11:36 AM

## 2019-08-11 NOTE — Progress Notes (Signed)
I rounded on pt tonight. Urine dark red or maroon in tubing and red in bag. No clots. I irrigated foley and again it clears almost immediately. Does not seem to be significant ongoing bleeding possible mucosal oozing from a previously distended bladder. Will leave 20Fr coude 2 way in place. Will follow. Will start tamsulosin.

## 2019-08-11 NOTE — Evaluation (Signed)
Physical Therapy Evaluation Patient Details Name: John Love MRN: [id number] DOB: Oct 17, 1950 Today's Date: 08/11/2019   History of Present Illness  John Love is a 69 y.o. male who was admitted 2/23 after V.fib arrest following his 2nd COVID vaccine. No PMH on file  Clinical Impression  Pt presents to PT with deficits in strength, power, gait, functional mobility, and balance. Pt mobilizing well at edge of bed and transfers. Ambulation limited due to hypertension at this time. Pt requiring close guard during OOB activity due to mild instability. Pt will benefit from aggressive mobilization and PT POC to improve activity tolerance and restore independent mobility.    Follow Up Recommendations Home health PT;Supervision/Assistance - 24 hour    Equipment Recommendations  (TBD, anticipate no needs)    Recommendations for Other Services       Precautions / Restrictions Precautions Precautions: None Precaution Comments: watch BP Restrictions Weight Bearing Restrictions: No RLE Weight Bearing: Non weight bearing      Mobility  Bed Mobility Overal bed mobility: Needs Assistance Bed Mobility: Supine to Sit     Supine to sit: Min assist     General bed mobility comments: for line management and to pull up at trunk due to sore chest  Transfers Overall transfer level: Needs assistance Equipment used: None Transfers: Sit to/from Stand Sit to Stand: Min assist;Min guard         General transfer comment: light min A- min guard. pt needing slight boost 2/2 to chest pain at sternum when bearing weight through UEs, but with min A from therapists this improved  Ambulation/Gait Ambulation/Gait assistance: Min guard Gait Distance (Feet): 5 Feet Assistive device: None Gait Pattern/deviations: Step-to pattern Gait velocity: reduced Gait velocity interpretation: 1.31 - 2.62 ft/sec, indicative of limited community ambulator General Gait Details: pt with short step to gait  with widened BOS  Stairs            Wheelchair Mobility    Modified Rankin (Stroke Patients Only)       Balance Overall balance assessment: Mild deficits observed, not formally tested                                           Pertinent Vitals/Pain Pain Assessment: Faces Faces Pain Scale: Hurts a little bit Pain Location: chest from rib/sternal pain Pain Descriptors / Indicators: Aching;Sore Pain Intervention(s): Monitored during session    Home Living Family/patient expects to be discharged to:: Private residence Living Arrangements: Spouse/significant other Available Help at Discharge: Family;Other (Comment)(wife still working) Type of Home: House Home Access: Stairs to enter Entrance Stairs-Rails: None Technical brewer of Steps: 3 Home Layout: One level Home Equipment: None      Prior Function Level of Independence: Independent               Hand Dominance        Extremity/Trunk Assessment   Upper Extremity Assessment Upper Extremity Assessment: Overall WFL for tasks assessed    Lower Extremity Assessment Lower Extremity Assessment: Defer to PT evaluation    Cervical / Trunk Assessment Cervical / Trunk Assessment: Normal  Communication   Communication: No difficulties  Cognition Arousal/Alertness: Awake/alert Behavior During Therapy: WFL for tasks assessed/performed Overall Cognitive Status: Within Functional Limits for tasks assessed  General Comments General comments (skin integrity, edema, etc.): pt on 3L Real. pt hypertensive with BP of 175/101 prior to mobilizing, 160/92 once relaxed in recliner. pt is asymptomatic    Exercises     Assessment/Plan    PT Assessment Patient needs continued PT services  PT Problem List Decreased strength;Decreased activity tolerance;Decreased balance;Decreased mobility;Decreased knowledge of use of DME;Decreased safety  awareness;Decreased knowledge of precautions;Cardiopulmonary status limiting activity;Pain       PT Treatment Interventions DME instruction;Gait training;Stair training;Functional mobility training;Therapeutic activities;Therapeutic exercise;Balance training;Neuromuscular re-education;Patient/family education    PT Goals (Current goals can be found in the Care Plan section)  Acute Rehab PT Goals Patient Stated Goal: reduce pain and return home PT Goal Formulation: With patient Time For Goal Achievement: 08/25/19 Potential to Achieve Goals: Good    Frequency Min 3X/week   Barriers to discharge        Co-evaluation PT/OT/SLP Co-Evaluation/Treatment: Yes Reason for Co-Treatment: For patient/therapist safety;To address functional/ADL transfers PT goals addressed during session: Mobility/safety with mobility;Proper use of DME;Strengthening/ROM OT goals addressed during session: ADL's and self-care;Strengthening/ROM       AM-PAC PT "6 Clicks" Mobility  Outcome Measure Help needed turning from your back to your side while in a flat bed without using bedrails?: A Little Help needed moving from lying on your back to sitting on the side of a flat bed without using bedrails?: A Little Help needed moving to and from a bed to a chair (including a wheelchair)?: A Little Help needed standing up from a chair using your arms (e.g., wheelchair or bedside chair)?: A Little Help needed to walk in hospital room?: A Little Help needed climbing 3-5 steps with a railing? : A Lot 6 Click Score: 17    End of Session Equipment Utilized During Treatment: Oxygen Activity Tolerance: Patient tolerated treatment well Patient left: in chair;with call bell/phone within reach Nurse Communication: Mobility status PT Visit Diagnosis: Unsteadiness on feet (R26.81)    Time: 6378-5885 PT Time Calculation (min) (ACUTE ONLY): 20 min   Charges:   PT Evaluation $PT Eval Moderate Complexity: [place], PT, DPT Acute Rehabilitation Pager: [phone]   Zenaida Niece 08/11/2019, 12:27 PM

## 2019-08-11 NOTE — Progress Notes (Signed)
Patient ID: John Love, male   DOB: 1951/01/12, 69 y.o.   MRN: [id number]     Advanced Heart Failure Rounding Note  PCP-Cardiologist: [name], MD   Subjective:    Extubated yesterday.  Awake and alert today.  No chest pain or dyspnea.  Luiz Blare out. Off norepinephrine.   Echo: EF 55-60%, inferolateral hypokinesis, normal RV size and systolic function.    Objective:   Weight Range: 102 kg Body mass index is 30.5 kg/m.   Vital Signs:   Temp:  [97.9 F (36.6 C)-99.7 F (37.6 C)] 99 F (37.2 C) (02/25 0700) Pulse Rate:  [92] 92 (02/24 1109) Resp:  [16-29] 19 (02/25 0700) BP: (109-181)/(64-118) 143/88 (02/25 0700) SpO2:  [96 %-100 %] 99 % (02/25 0700) Arterial Line BP: (111-158)/(61-81) 138/71 (02/24 2100) FiO2 (%):  [36 %-40 %] 36 % (02/24 1428) Last BM Date: (PTA)  Weight change: Filed Weights   08/09/19 1814 08/09/19 2200  Weight: 117.9 kg 102 kg    Intake/Output:   Intake/Output Summary (Last 24 hours) at 08/11/2019 0742 Last data filed at 08/11/2019 0600 Gross per 24 hour  Intake 907.91 ml  Output 3760 ml  Net -2852.09 ml      Physical Exam    General:  Well appearing. No resp difficulty HEENT: Normal Neck: Supple. JVP 7. Carotids 2+ bilat; no bruits. No lymphadenopathy or thyromegaly appreciated. Cor: PMI nondisplaced. Regular rate & rhythm. No rubs, gallops or murmurs. Lungs: Rhonchi bilaterally. Abdomen: Soft, nontender, nondistended. No hepatosplenomegaly. No bruits or masses. Good bowel sounds. Extremities: No cyanosis, clubbing, rash, edema Neuro: Alert & orientedx3, cranial nerves grossly intact. moves all 4 extremities w/o difficulty. Affect pleasant   Telemetry   NSR 90s (personally reviewed)  Labs    CBC Recent Labs    08/09/19 1807 08/09/19 1900 08/09/19 2202 08/10/19 0612  WBC 13.9*  --   --  12.4*  NEUTROABS 6.8  --   --   --   HGB 14.5   < > 15.0 13.5  HCT 48.3   < > 44.0 40.8  MCV 83.9  --   --  75.1*  PLT 289  --   --   316   < > = values in this interval not displayed.   Basic Metabolic Panel Recent Labs    08/10/19 0426 08/11/19 0628  NA 136 138  K 4.2 4.2  CL 103 102  CO2 23 26  GLUCOSE 127* 103*  BUN 13 15  CREATININE 1.21 1.22  CALCIUM 8.2* 8.3*  MG 1.9  --   PHOS 2.0* 3.1   Liver Function Tests Recent Labs    08/09/19 1807  AST 98*  ALT 71*  ALKPHOS 74  BILITOT 0.3  PROT 6.3*  ALBUMIN 2.7*   No results for input(s): LIPASE, AMYLASE in the last 72 hours. Cardiac Enzymes No results for input(s): CKTOTAL, CKMB, CKMBINDEX, TROPONINI in the last 72 hours.  BNP: BNP (last 3 results) No results for input(s): BNP in the last 8760 hours.  ProBNP (last 3 results) No results for input(s): PROBNP in the last 8760 hours.   D-Dimer No results for input(s): DDIMER in the last 72 hours. Hemoglobin A1C Recent Labs    08/09/19 1807  HGBA1C 6.3*   Fasting Lipid Panel Recent Labs    08/09/19 1807 08/09/19 1807 08/09/19 2249  CHOL 127  --   --   HDL 31*  --   --   LDLCALC 78  --   --  TRIG 91   < > 112  CHOLHDL 4.1  --   --    < > = values in this interval not displayed.   Thyroid Function Tests No results for input(s): TSH, T4TOTAL, T3FREE, THYROIDAB in the last 72 hours.  Invalid input(s): FREET3  Other results:   Imaging    ECHOCARDIOGRAM COMPLETE  Result Date: 08/10/2019    ECHOCARDIOGRAM REPORT   Patient Name:   John Love Date of Exam: 08/10/2019 Medical Rec #:  [id number]       Height:       72.0 in Accession #:    [id number]      Weight:       224.9 lb Date of Birth:  1951-05-13      BSA:          2.239 m Patient Age:    69 years        BP:           142/72 mmHg Patient Gender: M               HR:           90 bpm. Exam Location:  Inpatient Procedure: 2D Echo, Cardiac Doppler and Color Doppler Indications:    I49.9* Cardiac arrhythmia, unspecified. Cardiac arrest.  History:        Patient has no prior history of Echocardiogram examinations.                  Arrythmias:Cardiac Arrest and Ventricular Fibrillation. Cardiac                 shock. Respiratory failure.  Sonographer:    Roseanna Rainbow RDCS Referring Phys: [id number] [facility] P DES[facility]  Sonographer Comments: Technically difficult study due to poor echo windows, no parasternal window, suboptimal parasternal window, suboptimal apical window and echo performed with patient supine and on artificial respirator. IMPRESSIONS  1. Left ventricular ejection fraction, by estimation, is 55 to 60%. The left ventricle has normal function. The left ventricle demonstrates regional wall motion abnormalities (see scoring diagram/findings for description). There is mild concentric left ventricular hypertrophy. Left ventricular diastolic parameters are consistent with Grade I diastolic dysfunction (impaired relaxation).  2. Right ventricular systolic function is normal. The right ventricular size is normal.  3. The mitral valve is grossly normal. No evidence of mitral valve regurgitation. No evidence of mitral stenosis.  4. The aortic valve is grossly normal. Aortic valve regurgitation is not visualized. No aortic stenosis is present. Comparison(s): No prior Echocardiogram. FINDINGS  Left Ventricle: Left ventricular ejection fraction, by estimation, is 55 to 60%. The left ventricle has normal function. The left ventricle demonstrates regional wall motion abnormalities. The left ventricular internal cavity size was normal in size. There is mild concentric left ventricular hypertrophy. Left ventricular diastolic parameters are consistent with Grade I diastolic dysfunction (impaired relaxation). Normal left ventricular filling pressure.  LV Wall Scoring: The posterior wall is hypokinetic. Right Ventricle: The right ventricular size is normal. No increase in right ventricular wall thickness. Right ventricular systolic function is normal. Left Atrium: Left atrial size was normal in size. Right Atrium: Right atrial size was normal in size.  Pericardium: There is no evidence of pericardial effusion. Mitral Valve: The mitral valve is grossly normal. No evidence of mitral valve regurgitation. No evidence of mitral valve stenosis. Tricuspid Valve: The tricuspid valve is grossly normal. Tricuspid valve regurgitation is trivial. Aortic Valve: The aortic valve is grossly normal. Aortic valve regurgitation is not  visualized. No aortic stenosis is present. Pulmonic Valve: The pulmonic valve was grossly normal. Pulmonic valve regurgitation is not visualized. Aorta: The aortic root is normal in size and structure. IAS/Shunts: No atrial level shunt detected by color flow Doppler. Additional Comments: A venous catheter is visualized in the right atrium and right ventricle.  LEFT VENTRICLE PLAX 2D LVIDd:         4.15 cm     Diastology LVIDs:         3.00 cm     LV e' lateral:   6.20 cm/s LV PW:         1.50 cm     LV E/e' lateral: 11.0 LV IVS:        1.45 cm     LV e' medial:    4.57 cm/s LVOT diam:     2.40 cm     LV E/e' medial:  14.9 LV SV:         59 LV SV Index:   26 LVOT Area:     4.52 cm  LV Volumes (MOD) LV vol d, MOD A2C: 47.8 ml LV vol d, MOD A4C: 65.0 ml LV vol s, MOD A2C: 25.2 ml LV vol s, MOD A4C: 28.9 ml LV SV MOD A2C:     22.6 ml LV SV MOD A4C:     65.0 ml LV SV MOD BP:      29.3 ml RIGHT VENTRICLE             IVC RV S prime:     14.00 cm/s  IVC diam: 1.50 cm TAPSE (M-mode): 1.2 cm LEFT ATRIUM           Index LA diam:      2.60 cm 1.16 cm/m LA Vol (A2C): 21.9 ml 9.78 ml/m LA Vol (A4C): 39.3 ml 17.55 ml/m  AORTIC VALVE LVOT Vmax:   93.80 cm/s LVOT Vmean:  59.600 cm/s LVOT VTI:    0.131 m  AORTA Ao Root diam: 2.70 cm MITRAL VALVE MV Area (PHT): 2.66 cm    SHUNTS MV Decel Time: 285 msec    Systemic VTI:  0.13 m MV E velocity: 67.90 cm/s  Systemic Diam: 2.40 cm MV A velocity: 75.20 cm/s MV E/A ratio:  0.90 Eleonore Chiquito MD Electronically signed by Eleonore Chiquito MD Signature Date/Time: 08/10/2019/1:33:50 PM    Final       Medications:      Scheduled Medications: . aspirin  81 mg Oral Daily  . atorvastatin  80 mg Oral q1800  . atropine      . carvedilol  3.125 mg Oral BID WC  . Chlorhexidine Gluconate Cloth  6 each Topical Daily  . enoxaparin (LOVENOX) injection  40 mg Subcutaneous Daily  . insulin aspart  0-15 Units Subcutaneous Q4H  . pantoprazole (PROTONIX) IV  40 mg Intravenous Daily  . ramipril  2.5 mg Oral Daily  . sodium chloride flush  10-40 mL Intracatheter Q12H  . sodium chloride flush  3 mL Intravenous Q12H  . ticagrelor  90 mg Oral BID     Infusions: . sodium chloride    . ampicillin-sulbactam (UNASYN) IV Stopped (08/11/19 0413)  . norepinephrine (LEVOPHED) Adult infusion Stopped (08/10/19 1223)  . propofol (DIPRIVAN) infusion Stopped (08/10/19 1429)     PRN Medications:  sodium chloride, acetaminophen, fentaNYL (SUBLIMAZE) injection, fentaNYL (SUBLIMAZE) injection, ondansetron (ZOFRAN) IV, sodium chloride flush, sodium chloride flush   Assessment/Plan   1. Cardiac arrest: Out of hospital witnessed arrest after getting  COVID vaccination. Immediate CPR, time to ROSC 20 minutes with 3 shocks. He is awake/alert/extubated.  I think that this is unlikely to have been an anaphylactic vaccination reaction, suspect he could have had a vagal response to the shot triggering ACS/plaque rupture.  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  No further ventricular arrhythmias.  - Now s/p PCI with normal EF, will likely not require ICD unless further arrhythmias noted.  - Start Coreg 3.125 mg bid.  2. CAD: Cath as above, now s/p PCI to RCA and PCI to mid LCx into PLOM.  PLOM occlusion was culprit for event.  He has residual complex disease with 75% proximal LAD stenosis adjacent to ostial 95% D1 (moderate diagonal). - Continue ASA/ticagrelor.  - Continue statin.  - Will need intervention on LAD/diagonal, will discuss timing with Dr. Ellyn Hack.  3. Cardiogenic shock: Post-MI, required norepinephrine and initially  with low output on Swan.  Able to wean off pressors and now resolved.  Echo showed LV EF 55-60% with inferolateral hypokinesis and normal RV.  Luiz Blare out, he is not volume overloaded on exam.  - No Lasix appears to be needed.  - Start ramipril 2.5 mg daily.   - Start Coreg 3.125 mg bid.   4. Possible aspiration pneumonitis: Covering with Unasyn. Still on some oxygen, getting CXR this morning.   Should be able to leave CCU today.   Length of Stay: 2  Loralie Champagne, MD  08/11/2019, 7:42 AM  Advanced Heart Failure Team Pager [phone] (M-F; 7a - 4p)  Please contact [facility] for night-coverage after hours (4p -7a ) and weekends on [email or web address]

## 2019-08-11 NOTE — Progress Notes (Signed)
[id number] Came to see pt. Pt very sleepy and stated he worked with PT earlier. Pt is on 4L and sats at 98%. NSR 85. Gave pt MI booklet and discussed importance of brilinta and stressed taking his meds. Discussed CRP 2 and will refer to [facility]. Pt knows he will not attend until other blockages addressed. Will continue to follow pt and increase activity as tolerated. Graylon Good RN BSN 08/11/2019 12:46 PM

## 2019-08-12 ENCOUNTER — Encounter (HOSPITAL_COMMUNITY): Payer: Self-pay | Admitting: Cardiovascular Disease

## 2019-08-12 ENCOUNTER — Other Ambulatory Visit: Payer: Self-pay

## 2019-08-12 DIAGNOSIS — I469 Cardiac arrest, cause unspecified: Secondary | ICD-10-CM | POA: Diagnosis not present

## 2019-08-12 DIAGNOSIS — Z9861 Coronary angioplasty status: Secondary | ICD-10-CM

## 2019-08-12 DIAGNOSIS — R570 Cardiogenic shock: Secondary | ICD-10-CM | POA: Diagnosis not present

## 2019-08-12 DIAGNOSIS — I4901 Ventricular fibrillation: Secondary | ICD-10-CM | POA: Diagnosis not present

## 2019-08-12 DIAGNOSIS — I2510 Atherosclerotic heart disease of native coronary artery without angina pectoris: Secondary | ICD-10-CM | POA: Diagnosis not present

## 2019-08-12 DIAGNOSIS — J690 Pneumonitis due to inhalation of food and vomit: Secondary | ICD-10-CM

## 2019-08-12 LAB — BASIC METABOLIC PANEL
Anion gap: 11 (ref 5–15)
BUN: 24 mg/dL — ABNORMAL HIGH (ref 8–23)
CO2: 25 mmol/L (ref 22–32)
Calcium: 8.6 mg/dL — ABNORMAL LOW (ref 8.9–10.3)
Chloride: 101 mmol/L (ref 98–111)
Creatinine, Ser: 1.16 mg/dL (ref 0.61–1.24)
GFR calc Af Amer: >60 mL/min (ref >60)
GFR calc non Af Amer: >60 mL/min (ref >60)
Glucose, Bld: 96 mg/dL (ref 70–99)
Potassium: 4.1 mmol/L (ref 3.5–5.1)
Sodium: 137 mmol/L (ref 135–145)

## 2019-08-12 LAB — GLUCOSE, CAPILLARY
Glucose-Capillary: 104 mg/dL — ABNORMAL HIGH (ref 70–99)
Glucose-Capillary: 116 mg/dL — ABNORMAL HIGH (ref 70–99)
Glucose-Capillary: 81 mg/dL (ref 70–99)
Glucose-Capillary: 82 mg/dL (ref 70–99)
Glucose-Capillary: 89 mg/dL (ref 70–99)
Glucose-Capillary: 93 mg/dL (ref 70–99)

## 2019-08-12 LAB — CBC WITH DIFFERENTIAL/PLATELET
Abs Immature Granulocytes: 0.07 10*3/uL (ref 0.00–0.07)
Basophils Absolute: 0 10*3/uL (ref 0.0–0.1)
Basophils Relative: 0 %
Eosinophils Absolute: 0.1 10*3/uL (ref 0.0–0.5)
Eosinophils Relative: 1 %
HCT: 41 % (ref 39.0–52.0)
Hemoglobin: 13.2 g/dL (ref 13.0–17.0)
Immature Granulocytes: 1 %
Lymphocytes Relative: 11 %
Lymphs Abs: 1.6 10*3/uL (ref 0.7–4.0)
MCH: 24.5 pg — ABNORMAL LOW (ref 26.0–34.0)
MCHC: 32.2 g/dL (ref 30.0–36.0)
MCV: 76.2 fL — ABNORMAL LOW (ref 80.0–100.0)
Monocytes Absolute: 1.1 10*3/uL — ABNORMAL HIGH (ref 0.1–1.0)
Monocytes Relative: 8 %
Neutro Abs: 11.9 10*3/uL — ABNORMAL HIGH (ref 1.7–7.7)
Neutrophils Relative %: 79 %
Platelets: 298 10*3/uL (ref 150–400)
RBC: 5.38 MIL/uL (ref 4.22–5.81)
RDW: 15.6 % — ABNORMAL HIGH (ref 11.5–15.5)
WBC: 14.9 10*3/uL — ABNORMAL HIGH (ref 4.0–10.5)
nRBC: 0 % (ref 0.0–0.2)

## 2019-08-12 NOTE — Progress Notes (Signed)
Physical Therapy Treatment Patient Details Name: John Love MRN: [id number] DOB: 1950/11/13 Today's Date: 08/12/2019    History of Present Illness John Love is a 69 y.o. male who was admitted 2/23 after V.fib arrest following his 2nd COVID vaccine. No PMH on file    PT Comments    On arrival to room, pt in bed and agreeable to participate with therapy. BP 147/80 at start of session. SpO2 was at 100% on 3L O2. Removed O2 for treatment. Pt was able to progress to ambulation in the hall on RA with SpO2 remaing >90 during ambulation, dropping to 87% once pt was seated in recliner chair. Pt returned to 3L O2 via nasal canula. Patient would benefit from continued skilled PT to maximize functional independence and activity tolerance. Will continue to follow acutely.    Follow Up Recommendations  Home health PT;Supervision/Assistance - 24 hour     Equipment Recommendations  (TBD, anticipate no needs)    Recommendations for Other Services       Precautions / Restrictions Precautions Precautions: None Precaution Comments: watch BP Restrictions Weight Bearing Restrictions: No    Mobility  Bed Mobility Overal bed mobility: Needs Assistance Bed Mobility: Supine to Sit     Supine to sit: Min assist     General bed mobility comments: min A to elevate trunk and progress hips forward  Transfers Overall transfer level: Needs assistance Equipment used: None Transfers: Sit to/from Stand Sit to Stand: Min guard         General transfer comment: min guard for safety. Pt utilizing pillow for counter pressure on chest during rise. Multimodal cues for technique.  Ambulation/Gait Ambulation/Gait assistance: Min guard Gait Distance (Feet): 125 Feet Assistive device: None Gait Pattern/deviations: Step-through pattern;Decreased stride length;Wide base of support Gait velocity: reduced   General Gait Details: Pt progressed to hallway ambulation with min guard for safety and  cues for forward gaze.   Stairs             Wheelchair Mobility    Modified Rankin (Stroke Patients Only)       Balance Overall balance assessment: Mild deficits observed, not formally tested                                          Cognition Arousal/Alertness: Awake/alert Behavior During Therapy: WFL for tasks assessed/performed Overall Cognitive Status: Within Functional Limits for tasks assessed                                        Exercises      General Comments General comments (skin integrity, edema, etc.): on rising from EOB, pt's chuck pad was soiled with small BM. Assist provided for peri care. Pt was able to balance and perform peri care, but required min A for thorough cleaning.       Pertinent Vitals/Pain Pain Assessment: Faces Faces Pain Scale: Hurts a little bit Pain Location: chest from rib/sternal pain Pain Descriptors / Indicators: Aching;Sore Pain Intervention(s): Monitored during session;Limited activity within patient's tolerance;Repositioned    Home Living Family/patient expects to be discharged to:: Private residence Living Arrangements: Spouse/significant other                  Prior Function  PT Goals (current goals can now be found in the care plan section) Acute Rehab PT Goals Patient Stated Goal: reduce pain and return home PT Goal Formulation: With patient Time For Goal Achievement: 08/25/19 Potential to Achieve Goals: Good Progress towards PT goals: Progressing toward goals    Frequency    Min 3X/week      PT Plan Current plan remains appropriate    Co-evaluation              AM-PAC PT "6 Clicks" Mobility   Outcome Measure  Help needed turning from your back to your side while in a flat bed without using bedrails?: A Little Help needed moving from lying on your back to sitting on the side of a flat bed without using bedrails?: A Little Help needed moving  to and from a bed to a chair (including a wheelchair)?: A Little Help needed standing up from a chair using your arms (e.g., wheelchair or bedside chair)?: A Little Help needed to walk in hospital room?: A Little Help needed climbing 3-5 steps with a railing? : A Little 6 Click Score: 18    End of Session Equipment Utilized During Treatment: Oxygen;Gait belt Activity Tolerance: Patient tolerated treatment well Patient left: in chair;with call bell/phone within reach Nurse Communication: Mobility status PT Visit Diagnosis: Unsteadiness on feet (R26.81)     Time: 1517-6160 PT Time Calculation (min) (ACUTE ONLY): 28 min  Charges:  $Gait Training: 8-22 mins $Therapeutic Activity: 8-22 mins                     Benjiman Core, [place] Pager [phone] Acute Rehab   Allena Katz 08/12/2019, 12:10 PM

## 2019-08-12 NOTE — TOC Benefit Eligibility Note (Signed)
Transition of Care [facility]) Benefit Eligibility Note    Patient Details  Name: John Love MRN: [id number] Date of Birth: 1950/07/04   Medication/Dose: BRILINTA   90 MG BID  Covered?: Yes  Tier: (NO TIER)  Prescription Coverage Preferred Pharmacy: [facility]  Spoke with Person/Company/Phone Number:: [facility]   @   [employer] # [phone]  Co-Pay: $35.00     Deductible: (NO DEDUCTIBLE   /  OUT-OF-POCKET: NOT MET)       Memory Argue Phone Number: 08/12/2019, 12:41 PM

## 2019-08-12 NOTE — Progress Notes (Addendum)
Progress Note  Patient Name: John Love Date of Encounter: 08/12/2019  Primary Cardiologist: [name], MD   Patient profile   John Love is a 69 y.o. male who was admitted 2/23 after V.fib arrest following his 2nd COVID vaccine.  Subjective   Patient was seen and evaluated at bedside on morning rounds. Complains of having some phlemnNo acute events overnight. He endorses some pain on chest wall since cpr. No other acute complaints.  Inpatient Medications    Scheduled Meds: . amoxicillin-clavulanate  1 tablet Oral Q12H  . aspirin  81 mg Oral Daily  . atorvastatin  80 mg Oral q1800  . carvedilol  3.125 mg Oral BID WC  . Chlorhexidine Gluconate Cloth  6 each Topical Daily  . enoxaparin (LOVENOX) injection  40 mg Subcutaneous Daily  . insulin aspart  0-15 Units Subcutaneous Q4H  . ramipril  2.5 mg Oral Daily  . sodium chloride flush  10-40 mL Intracatheter Q12H  . sodium chloride flush  3 mL Intravenous Q12H  . tamsulosin  0.4 mg Oral QPC supper  . ticagrelor  90 mg Oral BID   Continuous Infusions: . sodium chloride    . sodium chloride irrigation     PRN Meds: sodium chloride, acetaminophen, ondansetron (ZOFRAN) IV, sodium chloride flush, sodium chloride flush   Vital Signs    Vitals:   08/11/19 2001 08/11/19 2358 08/12/19 0424 08/12/19 0742  BP: 122/76 (!) 143/67 139/74 (!) 155/91  Pulse: 93 83 99 96  Resp: 20 20 20 20   Temp: 98.1 F (36.7 C) (!) 97.5 F (36.4 C) 98.5 F (36.9 C) 98.2 F (36.8 C)  TempSrc: Oral Oral Oral Oral  SpO2: 100% 95% 92% 97%  Weight:   94.8 kg   Height:        Intake/Output Summary (Last 24 hours) at 08/12/2019 0747 Last data filed at 08/12/2019 0030 Gross per 24 hour  Intake 490 ml  Output 850 ml  Net -360 ml   Last 3 Weights 08/12/2019 08/09/2019 08/09/2019  Weight (lbs) 209 lb 224 lb 13.9 oz 260 lb  Weight (kg) 94.802 kg 102 kg 117.935 kg      Telemetry    Sinus - Personally Reviewed  ECG    Not done  today- Personally Reviewed  Physical Exam   GEN: No acute distress.   Neck: No JVD Cardiac: RRR, no murmurs, rubs, or gallops.  Respiratory: rhonchi -, nonlabored.  Chest wall tenderness. GI: Soft, nontender, non-distended  MS: No edema; No deformity. Neuro:  Nonfocal  Psych: Normal affect   Labs    High Sensitivity Troponin:   Recent Labs  Lab 08/09/19 1807 08/09/19 2203 08/10/19 0820 08/10/19 1436  TROPONINIHS 75* 8,006* 5,166* 2,911*      Chemistry Recent Labs  Lab 08/09/19 1807 08/09/19 1900 08/10/19 0426 08/11/19 0628 08/12/19 0513  NA 135   < > 136 138 137  K 3.8   < > 4.2 4.2 4.1  CL 99   < > 103 102 101  CO2 16*   < > 23 26 25   GLUCOSE 301*   < > 127* 103* 96  BUN 8   < > 13 15 24*  CREATININE 1.25*   < > 1.21 1.22 1.16  CALCIUM 8.7*   < > 8.2* 8.3* 8.6*  PROT 6.3*  --   --   --   --   ALBUMIN 2.7*  --   --   --   --  AST 98*  --   --   --   --   ALT 71*  --   --   --   --   ALKPHOS 74  --   --   --   --   BILITOT 0.3  --   --   --   --   GFRNONAA 59*   < > >60 >60 >60  GFRAA >60   < > >60 >60 >60  ANIONGAP 20*   < > 10 10 11    < > = values in this interval not displayed.     Hematology Recent Labs  Lab 08/09/19 1807 08/09/19 1900 08/09/19 2202 08/10/19 0612 08/12/19 0513  WBC 13.9*  --   --  12.4* 14.9*  RBC 5.76  --   --  5.43 5.38  HGB 14.5   < > 15.0 13.5 13.2  HCT 48.3   < > 44.0 40.8 41.0  MCV 83.9  --   --  75.1* 76.2*  MCH 25.2*  --   --  24.9* 24.5*  MCHC 30.0  --   --  33.1 32.2  RDW 17.1*  --   --  15.5 15.6*  PLT 289  --   --  316 298   < > = values in this interval not displayed.    BNPNo results for input(s): BNP, PROBNP in the last 168 hours.   DDimer No results for input(s): DDIMER in the last 168 hours.   Radiology    CT ABDOMEN PELVIS WO CONTRAST  Result Date: 08/11/2019 CLINICAL DATA:  Hematuria. Patient status post cardiac catheterization 08/09/2019. EXAM: CT ABDOMEN AND PELVIS WITHOUT CONTRAST TECHNIQUE:  Multidetector CT imaging of the abdomen and pelvis was performed following the standard protocol without IV contrast. COMPARISON:  . single-view of the chest 08/10/2019 and 08/11/2019. FINDINGS: Lower chest: There is a small right pleural effusion and dense airspace disease in the posterior right lower lobe. Trace left effusion is also identified. Heart size is upper normal. No pericardial effusion. Hepatobiliary: There is contrast in the gallbladder from the patient's cardiac catheterization. A single punctate calcification in the posterior right hepatic lobe is incidentally noted. The liver is otherwise unremarkable. Biliary tree appears normal. Pancreas: Unremarkable. No pancreatic ductal dilatation or surrounding inflammatory changes. Spleen: Normal in size without focal abnormality. Adrenals/Urinary Tract: The adrenal glands appear normal. The kidneys also appear normal without stone or hydronephrosis. No renal mass on infused examination. A Foley catheter is in place in the urinary bladder with some associated air in the bladder. The bladder is decompressed and its walls are severely thickened. The degree of thickening is out of proportion to decompression. Stomach/Bowel: Stomach is within normal limits. Appendix appears normal. No evidence of bowel wall thickening, distention, or inflammatory changes. A few scattered colonic diverticula are noted. Vascular/Lymphatic: Aortic atherosclerosis. No enlarged abdominal or pelvic lymph nodes. Reproductive: Prostatomegaly. Other: None. Musculoskeletal: No acute or focal abnormality. IMPRESSION: Marked thickening of the walls of the urinary bladder could be due to cystitis and/or bladder outlet obstruction in this patient with prostatomegaly. No other explanation for hematuria is present. Negative for urinary tract stone. Small right pleural effusion with dense airspace disease in the posterior right lower lobe which could be due to atelectasis or pneumonia. Scattered  diverticulosis without diverticulitis. Atherosclerosis. Electronically Signed   By: Inge Rise M.D.   On: 08/11/2019 11:24   DG CHEST PORT 1 VIEW  Result Date: 08/11/2019 CLINICAL DATA:  Congestive heart failure.  Shortness of breath. EXAM: PORTABLE CHEST 1 VIEW COMPARISON:  08/10/2019 and 04/07/2020 FINDINGS: The endotracheal tube and Swan-Ganz catheter and NG tube have been removed. Heart size and pulmonary vascularity are normal. Bilateral pulmonary edema has resolved. There is persistent atelectasis or consolidation at the right lung base medially. Aortic atherosclerosis. No acute bone abnormality. IMPRESSION: Resolution of pulmonary edema. Persistent atelectasis or consolidation at the right lung base medially. Electronically Signed   By: Lorriane Shire M.D.   On: 08/11/2019 09:33   ECHOCARDIOGRAM COMPLETE  Result Date: 08/10/2019    ECHOCARDIOGRAM REPORT   Patient Name:   John Love Date of Exam: 08/10/2019 Medical Rec #:  [id number]       Height:       72.0 in Accession #:    [id number]      Weight:       224.9 lb Date of Birth:  Nov 17, 1950      BSA:          2.239 m Patient Age:    77 years        BP:           142/72 mmHg Patient Gender: M               HR:           90 bpm. Exam Location:  Inpatient Procedure: 2D Echo, Cardiac Doppler and Color Doppler Indications:    I49.9* Cardiac arrhythmia, unspecified. Cardiac arrest.  History:        Patient has no prior history of Echocardiogram examinations.                 Arrythmias:Cardiac Arrest and Ventricular Fibrillation. Cardiac                 shock. Respiratory failure.  Sonographer:    Roseanna Rainbow RDCS Referring Phys: [id number] [facility] DES[facility]  Sonographer Comments: Technically difficult study due to poor echo windows, no parasternal window, suboptimal parasternal window, suboptimal apical window and echo performed with patient supine and on artificial respirator. IMPRESSIONS  1. Left ventricular ejection fraction, by estimation, is 55 to 60%.  The left ventricle has normal function. The left ventricle demonstrates regional wall motion abnormalities (see scoring diagram/findings for description). There is mild concentric left ventricular hypertrophy. Left ventricular diastolic parameters are consistent with Grade I diastolic dysfunction (impaired relaxation).  2. Right ventricular systolic function is normal. The right ventricular size is normal.  3. The mitral valve is grossly normal. No evidence of mitral valve regurgitation. No evidence of mitral stenosis.  4. The aortic valve is grossly normal. Aortic valve regurgitation is not visualized. No aortic stenosis is present. Comparison(s): No prior Echocardiogram. FINDINGS  Left Ventricle: Left ventricular ejection fraction, by estimation, is 55 to 60%. The left ventricle has normal function. The left ventricle demonstrates regional wall motion abnormalities. The left ventricular internal cavity size was normal in size. There is mild concentric left ventricular hypertrophy. Left ventricular diastolic parameters are consistent with Grade I diastolic dysfunction (impaired relaxation). Normal left ventricular filling pressure.  LV Wall Scoring: The posterior wall is hypokinetic. Right Ventricle: The right ventricular size is normal. No increase in right ventricular wall thickness. Right ventricular systolic function is normal. Left Atrium: Left atrial size was normal in size. Right Atrium: Right atrial size was normal in size. Pericardium: There is no evidence of pericardial effusion. Mitral Valve: The mitral valve is grossly normal. No evidence of mitral valve regurgitation. No evidence of mitral  valve stenosis. Tricuspid Valve: The tricuspid valve is grossly normal. Tricuspid valve regurgitation is trivial. Aortic Valve: The aortic valve is grossly normal. Aortic valve regurgitation is not visualized. No aortic stenosis is present. Pulmonic Valve: The pulmonic valve was grossly normal. Pulmonic valve  regurgitation is not visualized. Aorta: The aortic root is normal in size and structure. IAS/Shunts: No atrial level shunt detected by color flow Doppler. Additional Comments: A venous catheter is visualized in the right atrium and right ventricle.  LEFT VENTRICLE PLAX 2D LVIDd:         4.15 cm     Diastology LVIDs:         3.00 cm     LV e' lateral:   6.20 cm/s LV PW:         1.50 cm     LV E/e' lateral: 11.0 LV IVS:        1.45 cm     LV e' medial:    4.57 cm/s LVOT diam:     2.40 cm     LV E/e' medial:  14.9 LV SV:         59 LV SV Index:   26 LVOT Area:     4.52 cm  LV Volumes (MOD) LV vol d, MOD A2C: 47.8 ml LV vol d, MOD A4C: 65.0 ml LV vol s, MOD A2C: 25.2 ml LV vol s, MOD A4C: 28.9 ml LV SV MOD A2C:     22.6 ml LV SV MOD A4C:     65.0 ml LV SV MOD BP:      29.3 ml RIGHT VENTRICLE             IVC RV S prime:     14.00 cm/s  IVC diam: 1.50 cm TAPSE (M-mode): 1.2 cm LEFT ATRIUM           Index LA diam:      2.60 cm 1.16 cm/m LA Vol (A2C): 21.9 ml 9.78 ml/m LA Vol (A4C): 39.3 ml 17.55 ml/m  AORTIC VALVE LVOT Vmax:   93.80 cm/s LVOT Vmean:  59.600 cm/s LVOT VTI:    0.131 m  AORTA Ao Root diam: 2.70 cm MITRAL VALVE MV Area (PHT): 2.66 cm    SHUNTS MV Decel Time: 285 msec    Systemic VTI:  0.13 m MV E velocity: 67.90 cm/s  Systemic Diam: 2.40 cm MV A velocity: 75.20 cm/s MV E/A ratio:  0.90 Eleonore Chiquito MD Electronically signed by Eleonore Chiquito MD Signature Date/Time: 08/10/2019/1:33:50 PM    Final     Cardiac Studies   Heart Cath -PCI 08/09/2019:   CULPRIT LESION: Mid Cx to Dist Cx lesion is 80% stenosed. 3rd Mrg lesion is 100% stenosed at small branch take-off. ? DES PCI: 2.25 mm x 28 mm Synergy DES (proximal postdilated 2.75 mm.  ? Post intervention, there is a 0% residual stenosis.  Small side branch was occluded  Ost RCA to Prox RCA lesion is 50% stenosed.Prox RCA lesion is 80% stenosed.  ? DES PCI: 3.5 mm x 24 mm Synergy DES (postdilated to 4.1 mm) ? Post intervention, there is a 0%  residual stenosis.   Prox LAD to Mid LAD lesion is 75% stenosed.1st Diag lesion is 95% stenosed. [place] 1,1,1 - calcified bifurcation lesion   Diagnostic Dominance: Right  Intervention    Echo 08/10/2019: IMPRESSIONS    1. Left ventricular ejection fraction, by estimation, is 55 to 60%. The  left ventricle has normal function. The left ventricle demonstrates  regional wall motion abnormalities (see scoring diagram/findings for  description). There is mild concentric left  ventricular hypertrophy. Left ventricular diastolic parameters are  consistent with Grade I diastolic dysfunction (impaired relaxation).  2. Right ventricular systolic function is normal. The right ventricular  size is normal.  3. The mitral valve is grossly normal. No evidence of mitral valve  regurgitation. No evidence of mitral stenosis.  4. The aortic valve is grossly normal. Aortic valve regurgitation is not  visualized. No aortic stenosis is present.    Patient Profile     69 y.o. male  With unknown PMH. Hepresented to [facility] ED 2/23 with V.fib cardiac arrest during post COVID-19 vaccine observation. CPR was started, received shock and 450 mg of amio before ROSC. He then transferred to [facility] ED. He was intubated but awake on arrival. Of note, he vomited during king airway removal and intubation. His EKG on arrival showed some ST depression in V4,V5, V6 as well as ST elevation at AVR and he was sent to cath lab>showed 3 vessel disease. Successful mid to distal circumflex and RCA stenting performed to stabilize patoient and he will need staged LAD diagonal branch bifurcation later (likeley complicated intervention). He was started on pressor due to cardiogenic shock. Now improved, extubated and stable out of ICU.  Assessment & Plan    Cardiac arrest-V fib:Witnessed V fib-cardiac arrest, occurred out of hospital and after receiving COVID-19 vaccine. Received ROSC before arriving to the hospital, after  CPR, 450 mg of Amiodarone and several times of shock. Was inutabated but awake and purposfully moved his extremities on arrival.  Has made excellent neurologic recovery.   Cardiogenic shock (resolved): Was on epi drip that switched to Levophed when arrived to the ED.  Right heart cath cardiac index estimated at 1.6.  With low urine output, initially started on milrinone with IV Lasix and continuing Levophed. After echocardiogram revealed relatively normal EF, weaned off milrinone and Levophed after IV Lasix diuresis and then extubated.  Non-STEMI (EKG showed ST elevation in AVR and St depression in V4-V6)/CAD-unstable angina/CAD-PCI: S/p emergent heart cath that showed multi vessle disease. Stabilized with PCI to RCA and Cx. He will need staged PCI to LAD-diagonal bifurcation.Trop 75>8006.  On aspirin plus Brilinta-DAPT  High-dose high intensity statin  Has been started on carvedilol and lisinopril.  -We will try to titrate Coreg from 3.125 mg up to 6.25 mg BID starting this evening  -Will need staged PCI to LAD-diagonal bifurcation.  This can be done in the outpatient setting.  Provided he has no further chest pain or pressure, I think you can likely be discharged home to recover.  He can then be seen in follow-up relatively 2 to 3 weeks out in order to reassess and schedule for atherectomy based bifurcation LAD having diagonal PCI as a high risk procedure.  Would prefer to avoid doing it this close to his cardiac arrest.   Hematuria:  Urology is on board. Thinks that this is oozing from mucosa due to prior bladder dilation. Started on Tamsulosin Likely traumatic when inserting foley -Stable hemoglobin levels.  Witnessed aspiration pneumonitis: Afebrile, but does have white blood cell count elevation. Was on Unazyn 23-24. >switched to Augmentin -> plan to complete 7-day course total.  Acute hypoxic respiratory failure and probable pulmonary edema on CXR post cardiac arrest -Resolved  and now extubated   For questions or updates, please contact [place] Please consult [email or web address] for contact info under        Signed,  Dewayne Hatch, MD  08/12/2019, 7:47 AM     ATTENDING ATTESTATION  I have seen, examined and evaluated the patient this AM on rounds along with Dr. Myrtie Hawk (Resident) .  After reviewing all the available data and chart, we discussed the patients laboratory, study & physical findings as well as symptoms in detail. I agree with her findings, examination as well as impression recommendations as per our discussion.    Attending adjustments noted in italics.   John Love continues to be doing relatively well after his major cardiac event with cardiac arrest.  Initial cardiogenic shock now resolved as has his acute hypoxic respite failure.  He did have a pretty significant aspiration event and he is coughing up quite a bit of pinkish thick sputum.  I suspect this is partly from his aspiration.  He has been on Unasyn and now converted to Augmentin who plans to convert to 7-day course.  As for his CAD he is currently on aspirin and Brilinta along with high-dose high intensity statin.  Will start on carvedilol and lisinopril.  We will titrate his carvedilol at this evening.  No further angina. Plan for now will be to anticipate discharge over the weekend once his pulmonary status improves a little bit.  Would like to see white count going down.  I have asked that he ambulate in the hallway and use incentive spirometer.  If he is doing well with no activity angina symptoms, recommendation will be to discharge home and follow-up in the outpatient setting to reassess, titrate medications and schedule at that time staged PCI of the LAD having diagonal.  This is a relatively complicated PCI bifurcation lesion requiring atherectomy at least of the LAD and probably [place] angioplasty and bifurcation stenting.  This is not an acute lesion and can therefore  be done in the more stable environment.  I have discussed the procedure with the patient and his wife in detail and suspect that this can be set up in the outpatient setting in a staged manner based on my schedule.  We will need to arrange to have our atherectomy device wraps present.  I will need to discuss this with interventional colleagues to determine the best course of action to plan out the PCI prior to having it scheduled.    [name], M.D., M.S. Interventional Cardiologist   Pager # [phone] Phone # [phone] [address] [place], [place] [address]

## 2019-08-12 NOTE — Progress Notes (Signed)
Pt had SVT with HR of 159 that lasted for 5 secs. Pt denies any new symptoms. Cardiologist on call has been notified, no new orders were obtained. Will continue to monitor the patient.

## 2019-08-12 NOTE — Plan of Care (Signed)

## 2019-08-13 ENCOUNTER — Inpatient Hospital Stay (HOSPITAL_COMMUNITY): Payer: BC Managed Care – PPO

## 2019-08-13 DIAGNOSIS — R338 Other retention of urine: Secondary | ICD-10-CM | POA: Diagnosis not present

## 2019-08-13 DIAGNOSIS — I469 Cardiac arrest, cause unspecified: Secondary | ICD-10-CM | POA: Diagnosis not present

## 2019-08-13 DIAGNOSIS — I4901 Ventricular fibrillation: Secondary | ICD-10-CM | POA: Diagnosis not present

## 2019-08-13 DIAGNOSIS — R310 Gross hematuria: Secondary | ICD-10-CM | POA: Diagnosis not present

## 2019-08-13 DIAGNOSIS — R972 Elevated prostate specific antigen [PSA]: Secondary | ICD-10-CM | POA: Diagnosis not present

## 2019-08-13 DIAGNOSIS — N179 Acute kidney failure, unspecified: Secondary | ICD-10-CM | POA: Diagnosis not present

## 2019-08-13 LAB — BASIC METABOLIC PANEL
Anion gap: 12 (ref 5–15)
BUN: 24 mg/dL — ABNORMAL HIGH (ref 8–23)
CO2: 24 mmol/L (ref 22–32)
Calcium: 8.6 mg/dL — ABNORMAL LOW (ref 8.9–10.3)
Chloride: 98 mmol/L (ref 98–111)
Creatinine, Ser: 1.28 mg/dL — ABNORMAL HIGH (ref 0.61–1.24)
GFR calc Af Amer: >60 mL/min (ref >60)
GFR calc non Af Amer: 58 mL/min — ABNORMAL LOW (ref >60)
Glucose, Bld: 84 mg/dL (ref 70–99)
Potassium: 3.9 mmol/L (ref 3.5–5.1)
Sodium: 134 mmol/L — ABNORMAL LOW (ref 135–145)

## 2019-08-13 LAB — CBC WITH DIFFERENTIAL/PLATELET
Abs Immature Granulocytes: 0.08 10*3/uL — ABNORMAL HIGH (ref 0.00–0.07)
Basophils Absolute: 0 10*3/uL (ref 0.0–0.1)
Basophils Relative: 0 %
Eosinophils Absolute: 0.2 10*3/uL (ref 0.0–0.5)
Eosinophils Relative: 1 %
HCT: 41.4 % (ref 39.0–52.0)
Hemoglobin: 13.5 g/dL (ref 13.0–17.0)
Immature Granulocytes: 1 %
Lymphocytes Relative: 12 %
Lymphs Abs: 1.5 10*3/uL (ref 0.7–4.0)
MCH: 24.9 pg — ABNORMAL LOW (ref 26.0–34.0)
MCHC: 32.6 g/dL (ref 30.0–36.0)
MCV: 76.2 fL — ABNORMAL LOW (ref 80.0–100.0)
Monocytes Absolute: 1.1 10*3/uL — ABNORMAL HIGH (ref 0.1–1.0)
Monocytes Relative: 8 %
Neutro Abs: 10.4 10*3/uL — ABNORMAL HIGH (ref 1.7–7.7)
Neutrophils Relative %: 78 %
Platelets: 289 10*3/uL (ref 150–400)
RBC: 5.43 MIL/uL (ref 4.22–5.81)
RDW: 15.4 % (ref 11.5–15.5)
WBC: 13.3 10*3/uL — ABNORMAL HIGH (ref 4.0–10.5)
nRBC: 0 % (ref 0.0–0.2)

## 2019-08-13 LAB — GLUCOSE, CAPILLARY
Glucose-Capillary: 101 mg/dL — ABNORMAL HIGH (ref 70–99)
Glucose-Capillary: 115 mg/dL — ABNORMAL HIGH (ref 70–99)
Glucose-Capillary: 82 mg/dL (ref 70–99)
Glucose-Capillary: 88 mg/dL (ref 70–99)
Glucose-Capillary: 89 mg/dL (ref 70–99)
Glucose-Capillary: 92 mg/dL (ref 70–99)

## 2019-08-13 MED ORDER — CARVEDILOL 12.5 MG PO TABS
12.5000 mg | ORAL_TABLET | Freq: Two times a day (BID) | ORAL | Status: DC
  Administered 2019-08-13 – 2019-08-15 (×4): 12.5 mg via ORAL
  Filled 2019-08-13: qty 2
  Filled 2019-08-13 (×3): qty 1

## 2019-08-13 MED ORDER — CARVEDILOL 6.25 MG PO TABS
6.2500 mg | ORAL_TABLET | Freq: Two times a day (BID) | ORAL | Status: DC
  Administered 2019-08-13: 6.25 mg via ORAL
  Filled 2019-08-13: qty 1

## 2019-08-13 MED ORDER — INSULIN ASPART 100 UNIT/ML ~~LOC~~ SOLN
0.0000 [IU] | Freq: Three times a day (TID) | SUBCUTANEOUS | Status: DC
  Administered 2019-08-14 – 2019-08-19 (×4): 2 [IU] via SUBCUTANEOUS

## 2019-08-13 MED ORDER — IPRATROPIUM-ALBUTEROL 0.5-2.5 (3) MG/3ML IN SOLN: 3.0000 mL | RESPIRATORY_TRACT | Status: DC | PRN

## 2019-08-13 MED ORDER — GUAIFENESIN-DM 100-10 MG/5ML PO SYRP
5.0000 mL | ORAL_SOLUTION | ORAL | Status: DC | PRN
  Administered 2019-08-13 – 2019-08-19 (×12): 5 mL via ORAL
  Filled 2019-08-13 (×14): qty 5

## 2019-08-13 NOTE — Progress Notes (Signed)
   08/13/19 0502  Vitals  Temp 97.8 F (36.6 C)  Temp Source Oral  BP 135/85  MAP (mmHg) 102  BP Location Left Arm  BP Method Automatic  Patient Position (if appropriate) Sitting  Pulse Rate (!) 104  Pulse Rate Source Monitor  Resp 20  Oxygen Therapy  SpO2 98 %  O2 Device Nasal Cannula  O2 Flow Rate (L/min) 4 L/min  MEWS Score  MEWS Temp 0  MEWS Systolic 0  MEWS Pulse 1  MEWS RR 0  MEWS LOC 0  MEWS Score 1  MEWS Score Color Green   Pt is having frequent SVT episode but patient just complained of soreness on the chest as previous complain due to CPR done on him. No lightheadedness, no dizziness.  Spoke to DR [facility], he ordered to increase the carvidolol 6.25mg  2X daily and to start now. Will monitor

## 2019-08-13 NOTE — Progress Notes (Signed)
Progress Note  Patient Name: John Love Date of Encounter: 08/13/2019  Primary Cardiologist: [name], MD   Patient profile   John Love is a 69 y.o. male who was admitted 2/23 after V.fib arrest following his 2nd COVID vaccine.  Subjective   Chest wall pain with deep breath   Breathing is fair    Inpatient Medications    Scheduled Meds: . amoxicillin-clavulanate  1 tablet Oral Q12H  . aspirin  81 mg Oral Daily  . atorvastatin  80 mg Oral q1800  . carvedilol  6.25 mg Oral BID WC  . Chlorhexidine Gluconate Cloth  6 each Topical Daily  . enoxaparin (LOVENOX) injection  40 mg Subcutaneous Daily  . insulin aspart  0-15 Units Subcutaneous Q4H  . ramipril  2.5 mg Oral Daily  . sodium chloride flush  10-40 mL Intracatheter Q12H  . sodium chloride flush  3 mL Intravenous Q12H  . tamsulosin  0.4 mg Oral QPC supper  . ticagrelor  90 mg Oral BID   Continuous Infusions: . sodium chloride    . sodium chloride irrigation     PRN Meds: sodium chloride, acetaminophen, guaiFENesin-dextromethorphan, ondansetron (ZOFRAN) IV, sodium chloride flush, sodium chloride flush   Vital Signs    Vitals:   08/13/19 0040 08/13/19 0347 08/13/19 0502 08/13/19 0838  BP: 134/80 138/80 135/85 (!) 158/87  Pulse: 86 95 (!) 104 92  Resp: 18 19 20 18   Temp: (!) 97.3 F (36.3 C) 97.6 F (36.4 C) 97.8 F (36.6 C) (!) 97.4 F (36.3 C)  TempSrc: Oral Oral Oral Oral  SpO2: 99% 98% 98% 99%  Weight:  93.5 kg    Height:        Intake/Output Summary (Last 24 hours) at 08/13/2019 9622 Last data filed at 08/13/2019 0406 Gross per 24 hour  Intake 960 ml  Output 1700 ml  Net -740 ml   Last 3 Weights 08/13/2019 08/12/2019 08/09/2019  Weight (lbs) 206 lb 2.1 oz 209 lb 224 lb 13.9 oz  Weight (kg) 93.5 kg 94.802 kg 102 kg      Telemetry    Sinus rhythm with bursts of atrial tach at 170 bpm- Personally Reviewed  ECG    Not done today- Personally Reviewed  Physical Exam   GEN: No  acute distress.   Neck: No JVD Cardiac: RRR, no murmurs, rubs, or gallops.  Respiratory: Rhonchi bilaterally    Chest wall tenderness. GI: Soft, nontender, non-distended  MS: No edema; No deformity. Neuro:  Nonfocal  Psych: Normal affect   Labs    High Sensitivity Troponin:   Recent Labs  Lab 08/09/19 1807 08/09/19 2203 08/10/19 0820 08/10/19 1436  TROPONINIHS 75* 8,006* 5,166* 2,911*      Chemistry Recent Labs  Lab 08/09/19 1807 08/09/19 1900 08/11/19 0628 08/12/19 0513 08/13/19 0553  NA 135   < > 138 137 134*  K 3.8   < > 4.2 4.1 3.9  CL 99   < > 102 101 98  CO2 16*   < > 26 25 24   GLUCOSE 301*   < > 103* 96 84  BUN 8   < > 15 24* 24*  CREATININE 1.25*   < > 1.22 1.16 1.28*  CALCIUM 8.7*   < > 8.3* 8.6* 8.6*  PROT 6.3*  --   --   --   --   ALBUMIN 2.7*  --   --   --   --   AST 98*  --   --   --   --  ALT 71*  --   --   --   --   ALKPHOS 74  --   --   --   --   BILITOT 0.3  --   --   --   --   GFRNONAA 59*   < > >60 >60 58*  GFRAA >60   < > >60 >60 >60  ANIONGAP 20*   < > 10 11 12    < > = values in this interval not displayed.     Hematology Recent Labs  Lab 08/10/19 0612 08/12/19 0513 08/13/19 0553  WBC 12.4* 14.9* 13.3*  RBC 5.43 5.38 5.43  HGB 13.5 13.2 13.5  HCT 40.8 41.0 41.4  MCV 75.1* 76.2* 76.2*  MCH 24.9* 24.5* 24.9*  MCHC 33.1 32.2 32.6  RDW 15.5 15.6* 15.4  PLT 316 298 289    BNPNo results for input(s): BNP, PROBNP in the last 168 hours.   DDimer No results for input(s): DDIMER in the last 168 hours.   Radiology    CT ABDOMEN PELVIS WO CONTRAST  Result Date: 08/11/2019 CLINICAL DATA:  Hematuria. Patient status post cardiac catheterization 08/09/2019. EXAM: CT ABDOMEN AND PELVIS WITHOUT CONTRAST TECHNIQUE: Multidetector CT imaging of the abdomen and pelvis was performed following the standard protocol without IV contrast. COMPARISON:  . single-view of the chest 08/10/2019 and 08/11/2019. FINDINGS: Lower chest: There is a small  right pleural effusion and dense airspace disease in the posterior right lower lobe. Trace left effusion is also identified. Heart size is upper normal. No pericardial effusion. Hepatobiliary: There is contrast in the gallbladder from the patient's cardiac catheterization. A single punctate calcification in the posterior right hepatic lobe is incidentally noted. The liver is otherwise unremarkable. Biliary tree appears normal. Pancreas: Unremarkable. No pancreatic ductal dilatation or surrounding inflammatory changes. Spleen: Normal in size without focal abnormality. Adrenals/Urinary Tract: The adrenal glands appear normal. The kidneys also appear normal without stone or hydronephrosis. No renal mass on infused examination. A Foley catheter is in place in the urinary bladder with some associated air in the bladder. The bladder is decompressed and its walls are severely thickened. The degree of thickening is out of proportion to decompression. Stomach/Bowel: Stomach is within normal limits. Appendix appears normal. No evidence of bowel wall thickening, distention, or inflammatory changes. A few scattered colonic diverticula are noted. Vascular/Lymphatic: Aortic atherosclerosis. No enlarged abdominal or pelvic lymph nodes. Reproductive: Prostatomegaly. Other: None. Musculoskeletal: No acute or focal abnormality. IMPRESSION: Marked thickening of the walls of the urinary bladder could be due to cystitis and/or bladder outlet obstruction in this patient with prostatomegaly. No other explanation for hematuria is present. Negative for urinary tract stone. Small right pleural effusion with dense airspace disease in the posterior right lower lobe which could be due to atelectasis or pneumonia. Scattered diverticulosis without diverticulitis. Atherosclerosis. Electronically Signed   By: Inge Rise M.D.   On: 08/11/2019 11:24    Cardiac Studies   Heart Cath -PCI 08/09/2019:   CULPRIT LESION: Mid Cx to Dist Cx  lesion is 80% stenosed. 3rd Mrg lesion is 100% stenosed at small branch take-off. ? DES PCI: 2.25 mm x 28 mm Synergy DES (proximal postdilated 2.75 mm.  ? Post intervention, there is a 0% residual stenosis.  Small side branch was occluded  Ost RCA to Prox RCA lesion is 50% stenosed.Prox RCA lesion is 80% stenosed.  ? DES PCI: 3.5 mm x 24 mm Synergy DES (postdilated to 4.1 mm) ? Post intervention, there is  a 0% residual stenosis.   Prox LAD to Mid LAD lesion is 75% stenosed.1st Diag lesion is 95% stenosed. [place] 1,1,1 - calcified bifurcation lesion   Diagnostic Dominance: Right  Intervention    Echo 08/10/2019: IMPRESSIONS    1. Left ventricular ejection fraction, by estimation, is 55 to 60%. The  left ventricle has normal function. The left ventricle demonstrates  regional wall motion abnormalities (see scoring diagram/findings for  description). There is mild concentric left  ventricular hypertrophy. Left ventricular diastolic parameters are  consistent with Grade I diastolic dysfunction (impaired relaxation).  2. Right ventricular systolic function is normal. The right ventricular  size is normal.  3. The mitral valve is grossly normal. No evidence of mitral valve  regurgitation. No evidence of mitral stenosis.  4. The aortic valve is grossly normal. Aortic valve regurgitation is not  visualized. No aortic stenosis is present.    Patient Profile     69 y.o. male  With unknown PMH. Hepresented to [facility] ED 2/23 with V.fib cardiac arrest during post COVID-19 vaccine observation. CPR was started, received shock and 450 mg of amio before ROSC. He then transferred to [facility] ED. He was intubated but awake on arrival. Of note, he vomited during king airway removal and intubation. His EKG on arrival showed some ST depression in V4,V5, V6 as well as ST elevation at AVR and he was sent to cath lab>showed 3 vessel disease. Successful mid to distal circumflex and RCA stenting  performed to stabilize patoient and he will need staged LAD diagonal branch bifurcation later (likeley complicated intervention). He was started on pressor due to cardiogenic shock. Now improved, extubated and stable out of ICU.  Assessment & Plan    Cardiac arrest-V fib:Witnessed V fib-cardiac arrest, occurred out of hospital and after receiving COVID-19 vaccine. Received ROSC before arriving to the hospital, after CPR, 450 mg of Amiodarone and several times of shock. Was inutabated but awake and purposfully moved his extremities on arrival. Initial cardiogenic shock has resolved    . CAD Non-STEMI (EKG showed ST elevation in AVR and St depression in V4-V6)/CAD-unstable angina/CAD-PCI: S/p emergent heart cath that showed multi vessle disease. Stabilized with PCI to RCA and Cx. He will need staged PCI to LAD-diagonal bifurcation.Trop 75>8006.  On aspirin plus Brilinta-DAPT  High-dose high intensity statin  Has been started on carvedilol and lisinopril.  -We will try to titrate Coreg from 3.125 mg up to 6.25 mg BID starting this evening  -Will need staged PCI to LAD-diagonal bifurcation.  This can be done in the outpatient setting.  Provided he has no further chest pain or pressure, I think you can likely be discharged home to recover.  He can then be seen in follow-up relatively 2 to 3 weeks out in order to reassess and schedule for atherectomy based bifurcation LAD having diagonal PCI as a high risk procedure.  Would prefer to avoid doing it this close to his cardiac arrest.  Atrial tachycardia  Tele shows self limited salvos of PAT   Rates 170 bpm  WIll titrate carvedilol to 12.5 mg bid and follow  Hematuria:  Urology is on board. Thinks that this is oozing from mucosa due to prior bladder dilation. Started on Tamsulosin Likely traumatic when inserting foley -Stable hemoglobin levels.  PULMONARY   Witnessed aspiration pneumonitis: Afebrile, but does have white blood cell count  elevation. Was on Unazyn 23-24. >switched to Augmentin -> plan to complete 7-day course total. Exam suggests secretions  Have encouraged him  to use incentive spirometry   Will see if resp has any suggestions to help  For questions or updates, please contact [place] Please consult [email or web address] for contact info under        Signed, Dorris Carnes, MD  08/13/2019, 9:22 AM

## 2019-08-13 NOTE — Progress Notes (Addendum)
Pt had 11 bts SVT,nonsustained, eventually went back to Sinus rhythm immediately. Pt has no complains. Will monitor.  Had 12 bts  SVT again. Pt still no complains.  BP 134/80 (BP Location: Left Arm)   Pulse 86   Temp (!) 97.3 F (36.3 C) (Oral)   Resp 18   Ht 6' (1.829 m)   Wt 94.8 kg   SpO2 99%   BMI 28.35 kg/m

## 2019-08-13 NOTE — Progress Notes (Signed)
CARDIAC REHAB PHASE I   Attempted to see pt for ambulation.  Per RN, plan is to exchange cathter and pt is unavailable for walk at this time.  Will f/u on Monday.  Noel Christmas, RN 08/13/2019 2:34 PM

## 2019-08-13 NOTE — Progress Notes (Signed)
4 Days Post-Op Subjective: Patient reports no complaints.   Objective: Vital signs in last 24 hours: Temp:  [97.3 F (36.3 C)-98.6 F (37 C)] 98.1 F (36.7 C) (02/27 1217) Pulse Rate:  [83-104] 94 (02/27 1217) Resp:  [18-22] 19 (02/27 1217) BP: (104-159)/(70-87) 104/70 (02/27 1217) SpO2:  [96 %-100 %] 98 % (02/27 1217) Weight:  [93.5 kg] 93.5 kg (02/27 0347)  Intake/Output from previous day: 02/26 0701 - 02/27 0700 In: 960 [P.O.:960] Out: 1700 [Urine:1700] Intake/Output this shift: Total I/O In: 240 [P.O.:240] Out: -   Physical Exam:  NAD Urine is red   Procedure: I hand irrigated the bladder with 2 L to light red. I didn't appreciate any clots but was difficult to irrigate at the end likely due to bladder spasm. Foley would immediately drain. Equal return.   Lab Results: Recent Labs    08/12/19 0513 08/13/19 0553  HGB 13.2 13.5  HCT 41.0 41.4   BMET Recent Labs    08/12/19 0513 08/13/19 0553  NA 137 134*  K 4.1 3.9  CL 101 98  CO2 25 24  GLUCOSE 96 84  BUN 24* 24*  CREATININE 1.16 1.28*  CALCIUM 8.6* 8.6*   No results for input(s): LABPT, INR in the last 72 hours. No results for input(s): LABURIN in the last 72 hours. Results for orders placed or performed during the hospital encounter of 08/09/19  Respiratory Panel by RT PCR (Flu A&B, Covid) - Nasopharyngeal Swab     Status: None   Collection Time: 08/09/19  6:01 PM   Specimen: Nasopharyngeal Swab  Result Value Ref Range Status   SARS Coronavirus 2 by RT PCR NEGATIVE NEGATIVE Final    Comment: (NOTE) SARS-CoV-2 target nucleic acids are NOT DETECTED. The SARS-CoV-2 RNA is generally detectable in upper respiratoy specimens during the acute phase of infection. The lowest concentration of SARS-CoV-2 viral copies this assay can detect is 131 copies/mL. A negative result does not preclude SARS-Cov-2 infection and should not be used as the sole basis for treatment or other patient management decisions.  A negative result may occur with  improper specimen collection/handling, submission of specimen other than nasopharyngeal swab, presence of viral mutation(s) within the areas targeted by this assay, and inadequate number of viral copies (<131 copies/mL). A negative result must be combined with clinical observations, patient history, and epidemiological information. The expected result is Negative. Fact Sheet for Patients:  PinkCheek.be Fact Sheet for Healthcare Providers:  GravelBags.it This test is not yet ap proved or cleared by the [place] FDA and  has been authorized for detection and/or diagnosis of SARS-CoV-2 by FDA under an Emergency Use Authorization (EUA). This EUA will remain  in effect (meaning this test can be used) for the duration of the COVID-19 declaration under Section 564(b)(1) of the Act, 21 U.S.C. section 360bbb-3(b)(1), unless the authorization is terminated or revoked sooner.    Influenza A by PCR NEGATIVE NEGATIVE Final   Influenza B by PCR NEGATIVE NEGATIVE Final    Comment: (NOTE) The Xpert Xpress SARS-CoV-2/FLU/RSV assay is intended as an aid in  the diagnosis of influenza from Nasopharyngeal swab specimens and  should not be used as a sole basis for treatment. Nasal washings and  aspirates are unacceptable for Xpert Xpress SARS-CoV-2/FLU/RSV  testing. Fact Sheet for Patients: PinkCheek.be Fact Sheet for Healthcare Providers: GravelBags.it This test is not yet approved or cleared by the [place] FDA and  has been authorized for detection and/or diagnosis of SARS-CoV-2 by  FDA under an Emergency Use Authorization (EUA). This EUA will remain  in effect (meaning this test can be used) for the duration of the  Covid-19 declaration under Section 564(b)(1) of the Act, 21  U.S.C. section 360bbb-3(b)(1), unless the authorization is   terminated or revoked. Performed at [facility], [place] [address], [place], [place] [address]   MRSA PCR Screening     Status: None   Collection Time: 08/09/19 10:23 PM   Specimen: Nasopharyngeal  Result Value Ref Range Status   MRSA by PCR NEGATIVE NEGATIVE Final    Comment:        The GeneXpert MRSA Assay (FDA approved for NASAL specimens only), is one component of a comprehensive MRSA colonization surveillance program. It is not intended to diagnose MRSA infection nor to guide or monitor treatment for MRSA infections. Performed at [facility], [place] [address] [place], [place] [address]     Studies/Results: No results found.  Assessment/Plan:  Gross hematuria - Hgb remains stable, so not likely a significant bleed, but frustratingly not stopping. We may need to switch to CBI. I will get a bladder US to rule out significant clot.    LOS: 4 days   Festus Aloe 08/13/2019, 2:13 PM

## 2019-08-14 ENCOUNTER — Inpatient Hospital Stay (HOSPITAL_COMMUNITY): Payer: BC Managed Care – PPO

## 2019-08-14 DIAGNOSIS — R310 Gross hematuria: Secondary | ICD-10-CM | POA: Diagnosis not present

## 2019-08-14 DIAGNOSIS — R338 Other retention of urine: Secondary | ICD-10-CM | POA: Diagnosis not present

## 2019-08-14 DIAGNOSIS — I469 Cardiac arrest, cause unspecified: Secondary | ICD-10-CM | POA: Diagnosis not present

## 2019-08-14 DIAGNOSIS — I4901 Ventricular fibrillation: Secondary | ICD-10-CM | POA: Diagnosis not present

## 2019-08-14 DIAGNOSIS — R972 Elevated prostate specific antigen [PSA]: Secondary | ICD-10-CM | POA: Diagnosis not present

## 2019-08-14 DIAGNOSIS — R0602 Shortness of breath: Secondary | ICD-10-CM | POA: Diagnosis not present

## 2019-08-14 DIAGNOSIS — N179 Acute kidney failure, unspecified: Secondary | ICD-10-CM | POA: Diagnosis not present

## 2019-08-14 LAB — CBC WITH DIFFERENTIAL/PLATELET
Abs Immature Granulocytes: 0.07 10*3/uL (ref 0.00–0.07)
Basophils Absolute: 0 10*3/uL (ref 0.0–0.1)
Basophils Relative: 0 %
Eosinophils Absolute: 0.3 10*3/uL (ref 0.0–0.5)
Eosinophils Relative: 2 %
HCT: 37.6 % — ABNORMAL LOW (ref 39.0–52.0)
Hemoglobin: 12.4 g/dL — ABNORMAL LOW (ref 13.0–17.0)
Immature Granulocytes: 1 %
Lymphocytes Relative: 15 %
Lymphs Abs: 1.9 10*3/uL (ref 0.7–4.0)
MCH: 24.9 pg — ABNORMAL LOW (ref 26.0–34.0)
MCHC: 33 g/dL (ref 30.0–36.0)
MCV: 75.7 fL — ABNORMAL LOW (ref 80.0–100.0)
Monocytes Absolute: 1.2 10*3/uL — ABNORMAL HIGH (ref 0.1–1.0)
Monocytes Relative: 9 %
Neutro Abs: 9.6 10*3/uL — ABNORMAL HIGH (ref 1.7–7.7)
Neutrophils Relative %: 73 %
Platelets: 294 10*3/uL (ref 150–400)
RBC: 4.97 MIL/uL (ref 4.22–5.81)
RDW: 15 % (ref 11.5–15.5)
WBC: 13 10*3/uL — ABNORMAL HIGH (ref 4.0–10.5)
nRBC: 0 % (ref 0.0–0.2)

## 2019-08-14 LAB — BASIC METABOLIC PANEL
Anion gap: 11 (ref 5–15)
BUN: 25 mg/dL — ABNORMAL HIGH (ref 8–23)
CO2: 23 mmol/L (ref 22–32)
Calcium: 8.6 mg/dL — ABNORMAL LOW (ref 8.9–10.3)
Chloride: 98 mmol/L (ref 98–111)
Creatinine, Ser: 0.97 mg/dL (ref 0.61–1.24)
GFR calc Af Amer: >60 mL/min (ref >60)
GFR calc non Af Amer: >60 mL/min (ref >60)
Glucose, Bld: 100 mg/dL — ABNORMAL HIGH (ref 70–99)
Potassium: 3.7 mmol/L (ref 3.5–5.1)
Sodium: 132 mmol/L — ABNORMAL LOW (ref 135–145)

## 2019-08-14 LAB — GLUCOSE, CAPILLARY
Glucose-Capillary: 96 mg/dL (ref 70–99)
Glucose-Capillary: 125 mg/dL — ABNORMAL HIGH (ref 70–99)
Glucose-Capillary: 114 mg/dL — ABNORMAL HIGH (ref 70–99)
Glucose-Capillary: 133 mg/dL — ABNORMAL HIGH (ref 70–99)

## 2019-08-14 MED ORDER — LOPERAMIDE HCL 2 MG PO CAPS: 2.0000 mg | ORAL_CAPSULE | ORAL | Status: DC | PRN

## 2019-08-14 MED ORDER — SODIUM CHLORIDE 0.9 % IR SOLN
3000.0000 mL | Status: DC
  Administered 2019-08-14 – 2019-08-17 (×7): 3000 mL

## 2019-08-14 MED ORDER — LIDOCAINE HCL URETHRAL/MUCOSAL 2 % EX GEL
1.0000 "application " | Freq: Once | CUTANEOUS | Status: AC
  Administered 2019-08-14: 1 via URETHRAL
  Filled 2019-08-14: qty 20

## 2019-08-14 MED ORDER — SIMETHICONE 80 MG PO CHEW: 80.0000 mg | CHEWABLE_TABLET | Freq: Two times a day (BID) | ORAL | Status: DC | PRN

## 2019-08-14 NOTE — Progress Notes (Signed)
5 Days Post-Op Subjective: Patient reports no complaints. Nurses report urine bloodier and had some trouble irrigating the foley due to clots.   Objective: Vital signs in last 24 hours: Temp:  [97.9 F (36.6 C)-99 F (37.2 C)] 98.5 F (36.9 C) (02/28 1655) Pulse Rate:  [2-99] 91 (02/28 1655) Resp:  [18-20] 18 (02/28 1655) BP: (108-139)/(68-86) 135/86 (02/28 1655) SpO2:  [93 %-99 %] 93 % (02/28 1655) Weight:  [93.7 kg] 93.7 kg (02/28 0425)  Intake/Output from previous day: 02/27 0701 - 02/28 0700 In: 360 [P.O.:360] Out: 202 [Urine:200; Stool:2] Intake/Output this shift: No intake/output data recorded.  Physical Exam:  NAD Abd - soft, NT GU- urine deep red/maroon. His existing catheter is not irrigating as well and not clearing.   Discussed with patient the nature r/b/a to catheter change to 3 way and initiation of CBI versus trip to the operating room for cystoscopy clot effect fulguration.  He elected for catheter change.  He was prepped in the usual fashion after I removed his existing catheter.  There was clot in the lumen of his existing catheter obstructing it.  I tried to pass a straight 51 [place] three-way catheter but it could not make the turn in the prostate.  Therefore I went and got a 106 [place] hematuria catheter with a coud tip and instilled lidocaine jelly per urethra after reprepping the meatus.  I let that sit for a few minutes and then passed a 20 [place] hematuria catheter without difficulty.  I then irrigated him with a couple of bottles of saline to light pink.  There was some small clots but nothing significant.  CBI was initiated and at a brisk pace was light pink.  Lab Results: Recent Labs    08/12/19 0513 08/13/19 0553 08/14/19 0342  HGB 13.2 13.5 12.4*  HCT 41.0 41.4 37.6*   BMET Recent Labs    08/13/19 0553 08/14/19 0342  NA 134* 132*  K 3.9 3.7  CL 98 98  CO2 24 23  GLUCOSE 84 100*  BUN 24* 25*  CREATININE 1.28* 0.97  CALCIUM 8.6* 8.6*    No results for input(s): LABPT, INR in the last 72 hours. No results for input(s): LABURIN in the last 72 hours. Results for orders placed or performed during the hospital encounter of 08/09/19  Respiratory Panel by RT PCR (Flu A&B, Covid) - Nasopharyngeal Swab     Status: None   Collection Time: 08/09/19  6:01 PM   Specimen: Nasopharyngeal Swab  Result Value Ref Range Status   SARS Coronavirus 2 by RT PCR NEGATIVE NEGATIVE Final    Comment: (NOTE) SARS-CoV-2 target nucleic acids are NOT DETECTED. The SARS-CoV-2 RNA is generally detectable in upper respiratoy specimens during the acute phase of infection. The lowest concentration of SARS-CoV-2 viral copies this assay can detect is 131 copies/mL. A negative result does not preclude SARS-Cov-2 infection and should not be used as the sole basis for treatment or other patient management decisions. A negative result may occur with  improper specimen collection/handling, submission of specimen other than nasopharyngeal swab, presence of viral mutation(s) within the areas targeted by this assay, and inadequate number of viral copies (<131 copies/mL). A negative result must be combined with clinical observations, patient history, and epidemiological information. The expected result is Negative. Fact Sheet for Patients:  PinkCheek.be Fact Sheet for Healthcare Providers:  GravelBags.it This test is not yet ap proved or cleared by the [place] FDA and  has been authorized for detection and/or  diagnosis of SARS-CoV-2 by FDA under an Emergency Use Authorization (EUA). This EUA will remain  in effect (meaning this test can be used) for the duration of the COVID-19 declaration under Section 564(b)(1) of the Act, 21 U.S.C. section 360bbb-3(b)(1), unless the authorization is terminated or revoked sooner.    Influenza A by PCR NEGATIVE NEGATIVE Final   Influenza B by PCR NEGATIVE  NEGATIVE Final    Comment: (NOTE) The Xpert Xpress SARS-CoV-2/FLU/RSV assay is intended as an aid in  the diagnosis of influenza from Nasopharyngeal swab specimens and  should not be used as a sole basis for treatment. Nasal washings and  aspirates are unacceptable for Xpert Xpress SARS-CoV-2/FLU/RSV  testing. Fact Sheet for Patients: PinkCheek.be Fact Sheet for Healthcare Providers: GravelBags.it This test is not yet approved or cleared by the [place] FDA and  has been authorized for detection and/or diagnosis of SARS-CoV-2 by  FDA under an Emergency Use Authorization (EUA). This EUA will remain  in effect (meaning this test can be used) for the duration of the  Covid-19 declaration under Section 564(b)(1) of the Act, 21  U.S.C. section 360bbb-3(b)(1), unless the authorization is  terminated or revoked. Performed at [facility], [place] [address] [place], [place] [address]   MRSA PCR Screening     Status: None   Collection Time: 08/09/19 10:23 PM   Specimen: Nasopharyngeal  Result Value Ref Range Status   MRSA by PCR NEGATIVE NEGATIVE Final    Comment:        The GeneXpert MRSA Assay (FDA approved for NASAL specimens only), is one component of a comprehensive MRSA colonization surveillance program. It is not intended to diagnose MRSA infection nor to guide or monitor treatment for MRSA infections. Performed at [facility], [place] [address] [place], [place] [address]     Studies/Results: DG Chest 2 View  Result Date: 08/14/2019 CLINICAL DATA:  Shortness of breath EXAM: CHEST - 2 VIEW COMPARISON:  08/11/2019 FINDINGS: Cardiomegaly. Persistent atelectasis or consolidation of the medial right lung base. The visualized skeletal structures are unremarkable. IMPRESSION: 1. Persistent atelectasis or consolidation of the medial right lung base. No new airspace opacity. 2.  Cardiomegaly. Electronically Signed    By: Eddie Candle M.D.   On: 08/14/2019 12:51   US PELVIS LIMITED (TRANSABDOMINAL ONLY)  Result Date: 08/13/2019 CLINICAL DATA:  Gross hematuria. EXAM: LIMITED ULTRASOUND OF PELVIS TECHNIQUE: Limited transabdominal ultrasound examination of the pelvis was performed. COMPARISON:  CT dated 08/13/2019 FINDINGS: The bladder is decompressed with a Foley catheter in place. Foley catheter bulb appears to be well position within the urinary bladder. The bladder wall is diffusely thickened. Prostate gland is mildly enlarged. No clear intraluminal filling defects are noted within the urinary bladder that would be concerning for blood clots. IMPRESSION: 1. The urinary bladder is decompressed by Foley catheter. The Foley catheter bulb appears well position within the urinary bladder. 2. Diffuse bladder wall thickening is again noted. No obvious blood clots are noted within the urinary bladder. Electronically Signed   By: Constance Holster M.D.   On: 08/13/2019 18:12    Assessment/Plan:  Gross hematuria of unknown etiology but no worrisome features on CT.  Only finding was bladder wall thickening which could be due to chronic bladder outlet obstruction and bladder distention.  Now on CBI.  We will need to give that a day or 2 to work and then consider intravesical treatments versus trip to the operating room for cystoscopy, clot evacuation  and fulguration.   LOS: 5 days   John Love 08/14/2019, 7:09 PM

## 2019-08-14 NOTE — Plan of Care (Signed)
  Problem: Nutrition: Goal: Adequate nutrition will be maintained Outcome: Completed/Met   Problem: Elimination: Goal: Will not experience complications related to bowel motility Outcome: Completed/Met   Problem: Pain Managment: Goal: General experience of comfort will improve Outcome: Completed/Met   Problem: Skin Integrity: Goal: Risk for impaired skin integrity will decrease Outcome: Completed/Met   

## 2019-08-14 NOTE — Progress Notes (Signed)
Pt had some runs of SVT HR went to 180's non sustained, pt has been doing this during this admission pt was also dizzy while standing up during this, BP was WNL though;  MD notified, will continue to monitor, Thanks Arvella Nigh RN.

## 2019-08-14 NOTE — Progress Notes (Addendum)
Progress Note  Patient Name: John Love Date of Encounter: 08/14/2019  Primary Cardiologist: [name], MD   Patient profile   John Love is a 69 y.o. male who was admitted 2/23 after V.fib arrest following his 2nd COVID vaccine.  Subjective    Breathing is a little better  No CP except with cough   Inpatient Medications    Scheduled Meds: . aspirin  81 mg Oral Daily  . atorvastatin  80 mg Oral q1800  . carvedilol  12.5 mg Oral BID WC  . Chlorhexidine Gluconate Cloth  6 each Topical Daily  . enoxaparin (LOVENOX) injection  40 mg Subcutaneous Daily  . insulin aspart  0-15 Units Subcutaneous TID AC & HS  . ramipril  2.5 mg Oral Daily  . sodium chloride flush  10-40 mL Intracatheter Q12H  . sodium chloride flush  3 mL Intravenous Q12H  . tamsulosin  0.4 mg Oral QPC supper  . ticagrelor  90 mg Oral BID   Continuous Infusions: . sodium chloride    . sodium chloride irrigation     PRN Meds: sodium chloride, acetaminophen, guaiFENesin-dextromethorphan, ipratropium-albuterol, ondansetron (ZOFRAN) IV, sodium chloride flush, sodium chloride flush   Vital Signs    Vitals:   08/13/19 2104 08/13/19 2330 08/14/19 0026 08/14/19 0425  BP: 130/74 108/76 121/76 139/78  Pulse: 89 86 (!) 2 99  Resp: 18 18  20   Temp: 98.4 F (36.9 C) 98.5 F (36.9 C)  99 F (37.2 C)  TempSrc: Oral Oral  Oral  SpO2: 98% 98%  98%  Weight:    93.7 kg  Height:        Intake/Output Summary (Last 24 hours) at 08/14/2019 0624 Last data filed at 08/14/2019 0000 Gross per 24 hour  Intake 360 ml  Output 201 ml  Net 159 ml   Last 3 Weights 08/14/2019 08/13/2019 08/12/2019  Weight (lbs) 206 lb 9.6 oz 206 lb 2.1 oz 209 lb  Weight (kg) 93.713 kg 93.5 kg 94.802 kg      Telemetry    SR wtith bursts of PAT   Up to 170 bpm  - Personally Reviewed  ECG    Not done today- Personally Reviewed  Physical Exam   GEN: No acute distress.   Neck: No JVD Cardiac: RRR, no murmurs, rubs, or  gallops.  Respiratory: Rhonchi bilaterally Diffuse    Chest wall tenderness. GI: Soft, nontender, non-distended  MS: No edema; No deformity. Neuro:  Nonfocal  Psych: Normal affect   Labs    High Sensitivity Troponin:   Recent Labs  Lab 08/09/19 1807 08/09/19 2203 08/10/19 0820 08/10/19 1436  TROPONINIHS 75* 8,006* 5,166* 2,911*      Chemistry Recent Labs  Lab 08/09/19 1807 08/09/19 1900 08/12/19 0513 08/13/19 0553 08/14/19 0342  NA 135   < > 137 134* 132*  K 3.8   < > 4.1 3.9 3.7  CL 99   < > 101 98 98  CO2 16*   < > 25 24 23   GLUCOSE 301*   < > 96 84 100*  BUN 8   < > 24* 24* 25*  CREATININE 1.25*   < > 1.16 1.28* 0.97  CALCIUM 8.7*   < > 8.6* 8.6* 8.6*  PROT 6.3*  --   --   --   --   ALBUMIN 2.7*  --   --   --   --   AST 98*  --   --   --   --  ALT 71*  --   --   --   --   ALKPHOS 74  --   --   --   --   BILITOT 0.3  --   --   --   --   GFRNONAA 59*   < > >60 58* >60  GFRAA >60   < > >60 >60 >60  ANIONGAP 20*   < > 11 12 11    < > = values in this interval not displayed.     Hematology Recent Labs  Lab 08/12/19 0513 08/13/19 0553 08/14/19 0342  WBC 14.9* 13.3* 13.0*  RBC 5.38 5.43 4.97  HGB 13.2 13.5 12.4*  HCT 41.0 41.4 37.6*  MCV 76.2* 76.2* 75.7*  MCH 24.5* 24.9* 24.9*  MCHC 32.2 32.6 33.0  RDW 15.6* 15.4 15.0  PLT 298 289 294    BNPNo results for input(s): BNP, PROBNP in the last 168 hours.   DDimer No results for input(s): DDIMER in the last 168 hours.   Radiology    US PELVIS LIMITED (TRANSABDOMINAL ONLY)  Result Date: 08/13/2019 CLINICAL DATA:  Gross hematuria. EXAM: LIMITED ULTRASOUND OF PELVIS TECHNIQUE: Limited transabdominal ultrasound examination of the pelvis was performed. COMPARISON:  CT dated 08/13/2019 FINDINGS: The bladder is decompressed with a Foley catheter in place. Foley catheter bulb appears to be well position within the urinary bladder. The bladder wall is diffusely thickened. Prostate gland is mildly enlarged. No  clear intraluminal filling defects are noted within the urinary bladder that would be concerning for blood clots. IMPRESSION: 1. The urinary bladder is decompressed by Foley catheter. The Foley catheter bulb appears well position within the urinary bladder. 2. Diffuse bladder wall thickening is again noted. No obvious blood clots are noted within the urinary bladder. Electronically Signed   By: Constance Holster M.D.   On: 08/13/2019 18:12    Cardiac Studies   Heart Cath -PCI 08/09/2019:   CULPRIT LESION: Mid Cx to Dist Cx lesion is 80% stenosed. 3rd Mrg lesion is 100% stenosed at small branch take-off. ? DES PCI: 2.25 mm x 28 mm Synergy DES (proximal postdilated 2.75 mm.  ? Post intervention, there is a 0% residual stenosis.  Small side branch was occluded  Ost RCA to Prox RCA lesion is 50% stenosed.Prox RCA lesion is 80% stenosed.  ? DES PCI: 3.5 mm x 24 mm Synergy DES (postdilated to 4.1 mm) ? Post intervention, there is a 0% residual stenosis.   Prox LAD to Mid LAD lesion is 75% stenosed.1st Diag lesion is 95% stenosed. [place] 1,1,1 - calcified bifurcation lesion   Diagnostic Dominance: Right  Intervention    Echo 08/10/2019: IMPRESSIONS    1. Left ventricular ejection fraction, by estimation, is 55 to 60%. The  left ventricle has normal function. The left ventricle demonstrates  regional wall motion abnormalities (see scoring diagram/findings for  description). There is mild concentric left  ventricular hypertrophy. Left ventricular diastolic parameters are  consistent with Grade I diastolic dysfunction (impaired relaxation).  2. Right ventricular systolic function is normal. The right ventricular  size is normal.  3. The mitral valve is grossly normal. No evidence of mitral valve  regurgitation. No evidence of mitral stenosis.  4. The aortic valve is grossly normal. Aortic valve regurgitation is not  visualized. No aortic stenosis is present.    Patient  Profile     69 y.o. male  With unknown PMH. Hepresented to [facility] 2/23 with V.fib cardiac arrest during post COVID-19 vaccine observation.  CPR was started, received shock and 450 mg of amio before ROSC. He then transferred to [facility] ED. He was intubated but awake on arrival. Of note, he vomited during king airway removal and intubation. His EKG on arrival showed some ST depression in V4,V5, V6 as well as ST elevation at AVR and he was sent to cath lab>showed 3 vessel disease. Successful mid to distal circumflex and RCA stenting performed to stabilize patoient and he will need staged LAD diagonal branch bifurcation later (likeley complicated intervention). He was started on pressor due to cardiogenic shock. Now improved, extubated and stable out of ICU.  Assessment & Plan    Cardiac arrest-V fib:Witnessed V fib-cardiac arrest, occurred out of hospital and after receiving COVID-19 vaccine. Received ROSC before arriving to the hospital, after CPR, 450 mg of Amiodarone and several times of shock. Was inutabated but awake and purposfully moved his extremities on arrival. Initial cardiogenic shock has resolved  Volume is OK    . CAD Non-STEMI (EKG showed ST elevation in AVR and St depression in V4-V6)/CAD-unstable angina/CAD-PCI: S/p emergent heart cath that showed multi vessle disease. Stabilized with PCI to RCA and Cx. He will need staged PCI to LAD-diagonal bifurcation.Trop 75>8006.  On aspirin plus Brilinta-DAPT  High-dose high intensity statin  Has been started on carvedilol and lisinopril.  -We will try to titrate Coreg from 3.125 mg up to 6.25 mg BID starting this evening  -Will need staged PCI to LAD-diagonal bifurcation.  This can be done in the outpatient setting.  Provided he has no further chest pain or pressure, I think you can likely be discharged home to recover.  He can then be seen in follow-up relatively 2 to 3 weeks out in order to reassess and schedule for atherectomy based  bifurcation LAD having diagonal PCI as a high risk procedure.  Would prefer to avoid doing it this close to his cardiac arrest.  Atrial tachycardia  Still with spells upt to 170   He has had some mild bradycardia as well   Pt does not sense palpitations   *He did have one transient 2:1 block at 7:37 am with short PR  Transient slowing but no signif pause    I have reviewed with EP   Without symptoms or signif pauses would  follow  Keep on current meds    Can set up for event monitor at some point as outpt  Probably has infranodal dz.   Hematuria:  Urology has followed Thinks that this is oozing from mucosa due to prior bladder dilation. Started on Tamsulosin Likely traumatic when inserting foley -Stable hemoglobin levels.  PULMONARY   Witnessed aspiration pneumonitis: Afebrile, but does have white blood cell count elevation. Was on Unazyn 23-24. >switched to Augmentin -> plan to complete 7-day course total. Still with coarse rhonchi on exam   Using IS WIll ambulate without O2 and follow sats     Get Pa / Lat CXR     Resp is my concern  Need to get off O2 and make sure he doesn't desat    D/C home tomorrow if does OK   For questions or updates, please contact [facility] Please consult [email or web address] for contact info under        Signed, Dorris Carnes, MD  08/14/2019, 6:24 AM

## 2019-08-14 NOTE — Progress Notes (Signed)
Ambulate patient in hallway oxygen saturation at rest 96/RA, 94-95%/RA with ambulation, denies shortness of breath. Will continue to monitor the patient.

## 2019-08-15 DIAGNOSIS — R310 Gross hematuria: Secondary | ICD-10-CM | POA: Diagnosis not present

## 2019-08-15 DIAGNOSIS — I441 Atrioventricular block, second degree: Secondary | ICD-10-CM | POA: Diagnosis not present

## 2019-08-15 DIAGNOSIS — I4901 Ventricular fibrillation: Secondary | ICD-10-CM | POA: Diagnosis not present

## 2019-08-15 DIAGNOSIS — I469 Cardiac arrest, cause unspecified: Secondary | ICD-10-CM | POA: Diagnosis not present

## 2019-08-15 DIAGNOSIS — I214 Non-ST elevation (NSTEMI) myocardial infarction: Secondary | ICD-10-CM | POA: Diagnosis not present

## 2019-08-15 DIAGNOSIS — I471 Supraventricular tachycardia: Secondary | ICD-10-CM | POA: Diagnosis not present

## 2019-08-15 LAB — GLUCOSE, CAPILLARY
Glucose-Capillary: 118 mg/dL — ABNORMAL HIGH (ref 70–99)
Glucose-Capillary: 103 mg/dL — ABNORMAL HIGH (ref 70–99)
Glucose-Capillary: 92 mg/dL (ref 70–99)
Glucose-Capillary: 116 mg/dL — ABNORMAL HIGH (ref 70–99)

## 2019-08-15 LAB — CBC WITH DIFFERENTIAL/PLATELET
Abs Immature Granulocytes: 0.08 10*3/uL — ABNORMAL HIGH (ref 0.00–0.07)
Basophils Absolute: 0 10*3/uL (ref 0.0–0.1)
Basophils Relative: 0 %
Eosinophils Absolute: 0.3 10*3/uL (ref 0.0–0.5)
Eosinophils Relative: 2 %
HCT: 36.5 % — ABNORMAL LOW (ref 39.0–52.0)
Hemoglobin: 12 g/dL — ABNORMAL LOW (ref 13.0–17.0)
Immature Granulocytes: 1 %
Lymphocytes Relative: 17 %
Lymphs Abs: 1.9 10*3/uL (ref 0.7–4.0)
MCH: 24.8 pg — ABNORMAL LOW (ref 26.0–34.0)
MCHC: 32.9 g/dL (ref 30.0–36.0)
MCV: 75.6 fL — ABNORMAL LOW (ref 80.0–100.0)
Monocytes Absolute: 1.1 10*3/uL — ABNORMAL HIGH (ref 0.1–1.0)
Monocytes Relative: 9 %
Neutro Abs: 8.3 10*3/uL — ABNORMAL HIGH (ref 1.7–7.7)
Neutrophils Relative %: 71 %
Platelets: 301 10*3/uL (ref 150–400)
RBC: 4.83 MIL/uL (ref 4.22–5.81)
RDW: 14.8 % (ref 11.5–15.5)
WBC: 11.6 10*3/uL — ABNORMAL HIGH (ref 4.0–10.5)
nRBC: 0 % (ref 0.0–0.2)

## 2019-08-15 LAB — BASIC METABOLIC PANEL
Anion gap: 13 (ref 5–15)
BUN: 22 mg/dL (ref 8–23)
CO2: 24 mmol/L (ref 22–32)
Calcium: 8.7 mg/dL — ABNORMAL LOW (ref 8.9–10.3)
Chloride: 96 mmol/L — ABNORMAL LOW (ref 98–111)
Creatinine, Ser: 0.98 mg/dL (ref 0.61–1.24)
GFR calc Af Amer: >60 mL/min (ref >60)
GFR calc non Af Amer: >60 mL/min (ref >60)
Glucose, Bld: 110 mg/dL — ABNORMAL HIGH (ref 70–99)
Potassium: 3.7 mmol/L (ref 3.5–5.1)
Sodium: 133 mmol/L — ABNORMAL LOW (ref 135–145)

## 2019-08-15 MED ORDER — CARVEDILOL 12.5 MG PO TABS
12.5000 mg | ORAL_TABLET | Freq: Once | ORAL | Status: AC
  Administered 2019-08-15: 12.5 mg via ORAL
  Filled 2019-08-15: qty 1

## 2019-08-15 MED ORDER — POTASSIUM CHLORIDE CRYS ER 20 MEQ PO TBCR
40.0000 meq | EXTENDED_RELEASE_TABLET | Freq: Once | ORAL | Status: AC
  Administered 2019-08-15: 40 meq via ORAL
  Filled 2019-08-15: qty 2

## 2019-08-15 MED ORDER — CARVEDILOL 25 MG PO TABS
25.0000 mg | ORAL_TABLET | Freq: Two times a day (BID) | ORAL | Status: DC
  Administered 2019-08-15 – 2019-08-19 (×7): 25 mg via ORAL
  Filled 2019-08-15 (×7): qty 1

## 2019-08-15 NOTE — Plan of Care (Signed)
  Problem: Clinical Measurements: Goal: Will remain free from infection Outcome: Completed/Met Goal: Respiratory complications will improve Outcome: Completed/Met   Problem: Coping: Goal: Level of anxiety will decrease Outcome: Completed/Met

## 2019-08-15 NOTE — Progress Notes (Signed)
Physical Therapy Treatment Patient Details Name: John Love MRN: [id number] DOB: 25-Jan-1951 Today's Date: 08/15/2019    History of Present Illness John Love is a 69 y.o. male who was admitted 2/23 after V.fib arrest following his 2nd COVID vaccine. No PMH on file    PT Comments    Pt now with catheter and irrigation limiting ambulation tolerance. Pt c/o whooziness from change in medications. Dr. Burt Knack went over medications with him. Upon standing and ambulating to the bathroom pt filled his catheter bag and RN emptied it prior to ambulation. Acute PT to cont to follow and progress indep as able.    Follow Up Recommendations  Home health PT;Supervision/Assistance - 24 hour     Equipment Recommendations       Recommendations for Other Services       Precautions / Restrictions Precautions Precautions: Fall Precaution Comments: pt reports whoozy from change in medicines, pt also with cathetor attached to irrigation Restrictions Weight Bearing Restrictions: No    Mobility  Bed Mobility Overal bed mobility: Needs Assistance Bed Mobility: Supine to Sit     Supine to sit: Supervision     General bed mobility comments: HOB elevated, supervision for safety, minA for line management  Transfers Overall transfer level: Needs assistance Equipment used: None Transfers: Sit to/from Stand Sit to Stand: Min guard         General transfer comment: min guard for safety due to report of whooziness, no episodes of LOB  Ambulation/Gait Ambulation/Gait assistance: Min guard Gait Distance (Feet): 100 Feet Assistive device: IV Pole Gait Pattern/deviations: Step-through pattern;Decreased stride length;Narrow base of support Gait velocity: reduced Gait velocity interpretation: <1.31 ft/sec, indicative of household ambulator General Gait Details: pt with decreased step length compared to normal, limiting to 100' due to pt's discomfort with catheter and irrigation and  significant out put into the bag   Stairs             Wheelchair Mobility    Modified Rankin (Stroke Patients Only)       Balance Overall balance assessment: Mild deficits observed, not formally tested                                          Cognition Arousal/Alertness: Awake/alert Behavior During Therapy: WFL for tasks assessed/performed Overall Cognitive Status: Within Functional Limits for tasks assessed                                        Exercises      General Comments General comments (skin integrity, edema, etc.): assist pt to the bathroom, pt independent with tolieting and hygiene s/p BM, pt able to stand at sink and wash hands without LOB      Pertinent Vitals/Pain Pain Assessment: No/denies pain    Home Living                      Prior Function            PT Goals (current goals can now be found in the care plan section) Progress towards PT goals: Progressing toward goals    Frequency    Min 3X/week      PT Plan Current plan remains appropriate    Co-evaluation  AM-PAC PT "6 Clicks" Mobility   Outcome Measure  Help needed turning from your back to your side while in a flat bed without using bedrails?: A Little Help needed moving from lying on your back to sitting on the side of a flat bed without using bedrails?: A Little Help needed moving to and from a bed to a chair (including a wheelchair)?: A Little Help needed standing up from a chair using your arms (e.g., wheelchair or bedside chair)?: A Little Help needed to walk in hospital room?: A Little Help needed climbing 3-5 steps with a railing? : A Little 6 Click Score: 18    End of Session Equipment Utilized During Treatment: Gait belt Activity Tolerance: Patient tolerated treatment well Patient left: in chair;with call bell/phone within reach;with nursing/sitter in room Nurse Communication: Mobility status PT  Visit Diagnosis: Unsteadiness on feet (R26.81)     Time: 1007-1219 PT Time Calculation (min) (ACUTE ONLY): 24 min  Charges:  $Gait Training: 8-22 mins $Therapeutic Activity: 8-22 mins                     John Love, PT, DPT Acute Rehabilitation Services Pager #: [phone] Office #: [phone]    John Love 08/15/2019, 11:07 AM

## 2019-08-15 NOTE — Progress Notes (Addendum)
Progress Note  Patient Name: John Love Date of Encounter: 08/15/2019  Primary Cardiologist: [name], MD   Subjective   Feeling well this morning. Hoping to go home soon. No chest pain but soreness noted.   Inpatient Medications    Scheduled Meds: . aspirin  81 mg Oral Daily  . atorvastatin  80 mg Oral q1800  . carvedilol  12.5 mg Oral Once  . carvedilol  25 mg Oral BID WC  . Chlorhexidine Gluconate Cloth  6 each Topical Daily  . enoxaparin (LOVENOX) injection  40 mg Subcutaneous Daily  . insulin aspart  0-15 Units Subcutaneous TID AC & HS  . potassium chloride  40 mEq Oral Once  . ramipril  2.5 mg Oral Daily  . sodium chloride flush  10-40 mL Intracatheter Q12H  . sodium chloride flush  3 mL Intravenous Q12H  . tamsulosin  0.4 mg Oral QPC supper  . ticagrelor  90 mg Oral BID   Continuous Infusions: . sodium chloride    . sodium chloride irrigation     PRN Meds: sodium chloride, acetaminophen, guaiFENesin-dextromethorphan, ipratropium-albuterol, loperamide, ondansetron (ZOFRAN) IV, simethicone, sodium chloride flush, sodium chloride flush   Vital Signs    Vitals:   08/14/19 1655 08/14/19 1959 08/15/19 0415 08/15/19 0859  BP: 135/86 (!) 174/98 (!) 152/78 (!) 145/94  Pulse: 91 92 84 94  Resp: 18 18 18 18   Temp: 98.5 F (36.9 C) 97.8 F (36.6 C) (!) 97.3 F (36.3 C) 97.8 F (36.6 C)  TempSrc: Oral Oral Oral Oral  SpO2: 93% 95% 97% 97%  Weight:   93.6 kg   Height:        Intake/Output Summary (Last 24 hours) at 08/15/2019 0950 Last data filed at 08/15/2019 0750 Gross per 24 hour  Intake 680 ml  Output 24676 ml  Net -23996 ml   Last 3 Weights 08/15/2019 08/14/2019 08/13/2019  Weight (lbs) 206 lb 6.4 oz 206 lb 9.6 oz 206 lb 2.1 oz  Weight (kg) 93.622 kg 93.713 kg 93.5 kg      Telemetry    SR with bursts of brief atrial tachycardia - Personally Reviewed  ECG    No new tracing this morning.  Physical Exam  Pleasant older AAM, sitting up in  bed. GEN: No acute distress.   Neck: No JVD Cardiac: RRR, no murmurs, rubs, or gallops.  Respiratory: Rhonchi bilaterally.  GI: Soft, nontender, non-distended  MS: No edema; No deformity. Neuro:  Nonfocal  Psych: Normal affect   Labs    High Sensitivity Troponin:   Recent Labs  Lab 08/09/19 1807 08/09/19 2203 08/10/19 0820 08/10/19 1436  TROPONINIHS 75* 8,006* 5,166* 2,911*      Chemistry Recent Labs  Lab 08/09/19 1807 08/09/19 1900 08/13/19 0553 08/14/19 0342 08/15/19 0400  NA 135   < > 134* 132* 133*  K 3.8   < > 3.9 3.7 3.7  CL 99   < > 98 98 96*  CO2 16*   < > 24 23 24   GLUCOSE 301*   < > 84 100* 110*  BUN 8   < > 24* 25* 22  CREATININE 1.25*   < > 1.28* 0.97 0.98  CALCIUM 8.7*   < > 8.6* 8.6* 8.7*  PROT 6.3*  --   --   --   --   ALBUMIN 2.7*  --   --   --   --   AST 98*  --   --   --   --  ALT 71*  --   --   --   --   ALKPHOS 74  --   --   --   --   BILITOT 0.3  --   --   --   --   GFRNONAA 59*   < > 58* >60 >60  GFRAA >60   < > >60 >60 >60  ANIONGAP 20*   < > 12 11 13    < > = values in this interval not displayed.     Hematology Recent Labs  Lab 08/13/19 0553 08/14/19 0342 08/15/19 0400  WBC 13.3* 13.0* 11.6*  RBC 5.43 4.97 4.83  HGB 13.5 12.4* 12.0*  HCT 41.4 37.6* 36.5*  MCV 76.2* 75.7* 75.6*  MCH 24.9* 24.9* 24.8*  MCHC 32.6 33.0 32.9  RDW 15.4 15.0 14.8  PLT 289 294 301    BNPNo results for input(s): BNP, PROBNP in the last 168 hours.   DDimer No results for input(s): DDIMER in the last 168 hours.   Radiology    DG Chest 2 View  Result Date: 08/14/2019 CLINICAL DATA:  Shortness of breath EXAM: CHEST - 2 VIEW COMPARISON:  08/11/2019 FINDINGS: Cardiomegaly. Persistent atelectasis or consolidation of the medial right lung base. The visualized skeletal structures are unremarkable. IMPRESSION: 1. Persistent atelectasis or consolidation of the medial right lung base. No new airspace opacity. 2.  Cardiomegaly. Electronically Signed   By:  Eddie Candle M.D.   On: 08/14/2019 12:51   US PELVIS LIMITED (TRANSABDOMINAL ONLY)  Result Date: 08/13/2019 CLINICAL DATA:  Gross hematuria. EXAM: LIMITED ULTRASOUND OF PELVIS TECHNIQUE: Limited transabdominal ultrasound examination of the pelvis was performed. COMPARISON:  CT dated 08/13/2019 FINDINGS: The bladder is decompressed with a Foley catheter in place. Foley catheter bulb appears to be well position within the urinary bladder. The bladder wall is diffusely thickened. Prostate gland is mildly enlarged. No clear intraluminal filling defects are noted within the urinary bladder that would be concerning for blood clots. IMPRESSION: 1. The urinary bladder is decompressed by Foley catheter. The Foley catheter bulb appears well position within the urinary bladder. 2. Diffuse bladder wall thickening is again noted. No obvious blood clots are noted within the urinary bladder. Electronically Signed   By: Constance Holster M.D.   On: 08/13/2019 18:12    Cardiac Studies   Cath: 08/09/19    Prox RCA lesion is 80% stenosed.  Ost RCA to Prox RCA lesion is 50% stenosed.  1st Diag lesion is 95% stenosed.  Prox LAD to Mid LAD lesion is 75% stenosed.  3rd Mrg lesion is 100% stenosed.  Mid Cx to Dist Cx lesion is 80% stenosed.  A stent was successfully placed.  Post intervention, there is a 0% residual stenosis.  A stent was successfully placed.  Post intervention, there is a 0% residual stenosis.  A drug-eluting stent was successfully placed.  Post intervention, there is a 0% residual stenosis.  Post intervention, there is a 0% residual stenosis.   IMPRESSION: Successful mid to distal circumflex stenting which was the "culprit lesion in the setting of witnessed VF arrest post Covid vaccine administration.  I wonder whether this was an anaphylactic reaction creating hypotension and acute coronary artery thrombosis in the setting of three-vessel disease.  He has successful RCA intervention  as well.  He will need staged LAD diagonal branch bifurcation intervention using orbital atherectomy given the calcific nature of the disease assuming that he neurologically recovers.  I spoken to PCCM and they are aware  of the patient and will manage his vent.  We will discuss whether he needs Arctic sun, systemic cooling.  Quay Burow. MD, [facility]  Diagnostic Dominance: Right  Intervention   TTE: 08/10/19  IMPRESSIONS    1. Left ventricular ejection fraction, by estimation, is 55 to 60%. The  left ventricle has normal function. The left ventricle demonstrates  regional wall motion abnormalities (see scoring diagram/findings for  description). There is mild concentric left  ventricular hypertrophy. Left ventricular diastolic parameters are  consistent with Grade I diastolic dysfunction (impaired relaxation).  2. Right ventricular systolic function is normal. The right ventricular  size is normal.  3. The mitral valve is grossly normal. No evidence of mitral valve  regurgitation. No evidence of mitral stenosis.  4. The aortic valve is grossly normal. Aortic valve regurgitation is not  visualized. No aortic stenosis is present.   Patient Profile     69 y.o. male withunknown PMH. Hepresented to [facility] ED 2/23 with V.fibcardiac arrest during post COVID-19 vaccine observation.CPR was started, and receivedshockand450mg  ofamio before ROSC. He then transferred to [facility] ED. He was intubated but awake on arrival. Of note, he vomited during king airway removal and intubation. His EKG on arrival showed some ST depression in V4,V5, V6 as well as ST elevation at AVR and he was sent to cath lab>showed 3 vessel disease.Successful mid to distal circumflexandRCA stenting performed to stabilize patoient and he will need staged LAD diagonal branch bifurcationlater (likely complicated intervention).   Assessment & Plan    1. Vfib arrest: OOH arrest that occurred after receiving COVID-19  vaccine, seizure like activity. ROSC obtained prior to hospital arrival. CPR started at scene and received 6 shocks along with amio 450. Total downtime was 20 minutes. Progressing well, working well with PT.   2. NSTEMI: underwent emergent cardiac cath noted above with successful PCI/DES to RCA and Lcx. hsTn 8006. Placed on DAPT with ASA/Brilinta. Tolerating the addition of BB and ACE. Echo with normal EF with posterior wall hypokinesis.  -- will further increase coreg to 25mg  BID -- per Dr. Ellyn Hack will need outpatient staged PCI/to LAD-diag but will require arterectomy.   3. Atrial tachycardia: Brief episodes of atrial tach noted on telemetry this morning. Will further increase BB as above.   4. Hematuria: appreciate urology assistance. Has indwelling foley cath with CBI. H/H stable. -- started on tamsulosin -- urine fairly clear this morning, faint pink tinge. Will follow up with urology plans for further management.   5. Aspiration pneumonitis: treated with 7 day course of unazyn/augmentin. Still with course rhonchi on exam. WBC trending down. Has been weaned from [place] to RA.  -- continued use of IS  For questions or updates, please contact [facility] Please consult [email or web address] for contact info under    Signed, Reino Bellis, NP  08/15/2019, 9:50 AM    Patient seen, examined. Available data reviewed. Agree with findings, assessment, and plan as outlined by Reino Bellis, NP.  The patient is independently interviewed and examined.  On my examination, he is alert, oriented, in no distress.  Lung fields are clear, JVP is normal, HEENT is normal, heart is regular rate and rhythm with no murmur or gallop, there is no lower extremity edema, skin is warm and dry without rash.  Hematuria appears to be clearing, as the output in his Foley bag with irrigation is just pink-tinged and very faint appearing now.  Once urology follow-up is clarified, I think he is stable for discharge from the  hospital.  His medications are reviewed and include a beta-blocker, low-dose ACE inhibitor, DAPT with aspirin and ticagrelor, and a high intensity statin drug with atorvastatin 80 mg.  He should have close outpatient follow-up with Dr. Ellyn Hack or his APP with plans noted to perform atherectomy and LAD/diagonal bifurcation PCI.  Sherren Mocha, M.D. 08/15/2019 10:31 AM

## 2019-08-15 NOTE — Progress Notes (Signed)
CARDIAC REHAB PHASE I   Stent education completed with pt and wife. Pt educated on importance of ASA, Brilinta, and NTG. Pt given heart healthy diet. Encouraged continued ambulation, though pt states it is limited by CBI. Pt a week past cath, restriction education deferred. Encouraged a walk later today with nurse. Will f/u tomorrow and continue to encourage ambulation.  [id number] Rufina Falco, RN BSN 08/15/2019 1:53 PM

## 2019-08-15 NOTE — Progress Notes (Signed)
Urology Inpatient Progress Report  Cardiac arrest ([place]) [I46.9]  Procedure(s): LEFT HEART CATH AND CORONARY ANGIOGRAPHY CORONARY STENT INTERVENTION  6 Days Post-Op   Intv/Subj: No acute events overnight. CBI has been running on fast drip and is clear.   Principal Problem:   Cardiac arrest with ventricular fibrillation (HCC) -> 20 minutes CPR with ROSC Active Problems:   Sustained ventricular fibrillation (HCC)   Cardiogenic shock (HCC)-borderline   Respiratory failure (HCC)   AKI (acute kidney injury) ([place])   Multiple vessel coronary artery disease   CAD S/P percutaneous coronary angioplasty  Current Facility-Administered Medications  Medication Dose Route Frequency Provider Last Rate Last Admin  . 0.9 %  sodium chloride infusion  250 mL Intravenous PRN Lorretta Harp, MD      . acetaminophen (TYLENOL) tablet 650 mg  650 mg Oral Q4H PRN Lorretta Harp, MD   650 mg at 08/14/19 1951  . aspirin chewable tablet 81 mg  81 mg Oral Daily Lorretta Harp, MD   81 mg at 08/15/19 0856  . atorvastatin (LIPITOR) tablet 80 mg  80 mg Oral q1800 Lorretta Harp, MD   80 mg at 08/14/19 1736  . carvedilol (COREG) tablet 25 mg  25 mg Oral BID WC Cheryln Manly, NP      . Chlorhexidine Gluconate Cloth 2 % PADS 6 each  6 each Topical Daily Lorretta Harp, MD   6 each at 08/14/19 1000  . enoxaparin (LOVENOX) injection 40 mg  40 mg Subcutaneous Daily Leonie Man, MD   40 mg at 08/15/19 0856  . guaiFENesin-dextromethorphan (ROBITUSSIN DM) 100-10 MG/5ML syrup 5 mL  5 mL Oral Q4H PRN Lorretta Harp, MD   5 mL at 08/15/19 1414  . insulin aspart (novoLOG) injection 0-15 Units  0-15 Units Subcutaneous TID AC & HS Coniglio, Alcario Drought, MD   2 Units at 08/14/19 1246  . ipratropium-albuterol (DUONEB) 0.5-2.5 (3) MG/3ML nebulizer solution 3 mL  3 mL Nebulization Q4H PRN Fay Records, MD      . loperamide (IMODIUM) capsule 2 mg  2 mg Oral PRN Lorretta Harp, MD      . ondansetron  [facility]) injection 4 mg  4 mg Intravenous Q6H PRN Lorretta Harp, MD      . ramipril (ALTACE) capsule 2.5 mg  2.5 mg Oral Daily Larey Dresser, MD   2.5 mg at 08/15/19 0855  . simethicone (MYLICON) chewable tablet 80 mg  80 mg Oral BID PRN Lorretta Harp, MD      . sodium chloride flush (NS) 0.9 % injection 10-40 mL  10-40 mL Intracatheter Q12H Lorretta Harp, MD   10 mL at 08/15/19 1003  . sodium chloride flush (NS) 0.9 % injection 10-40 mL  10-40 mL Intracatheter PRN Lorretta Harp, MD      . sodium chloride flush (NS) 0.9 % injection 3 mL  3 mL Intravenous Q12H Lorretta Harp, MD   3 mL at 08/15/19 1003  . sodium chloride flush (NS) 0.9 % injection 3 mL  3 mL Intravenous PRN Lorretta Harp, MD      . sodium chloride irrigation 0.9 % 3,000 mL  3,000 mL Irrigation Continuous Festus Aloe, MD   3,000 mL at 08/14/19 1830  . tamsulosin (FLOMAX) capsule 0.4 mg  0.4 mg Oral QPC supper Festus Aloe, MD   0.4 mg at 08/14/19 1735  . ticagrelor (BRILINTA) tablet 90 mg  90 mg Oral BID  Lorretta Harp, MD   90 mg at 08/15/19 0856     Objective: Vital: Vitals:   08/14/19 1959 08/15/19 0415 08/15/19 0859 08/15/19 1139  BP: (!) 174/98 (!) 152/78 (!) 145/94 119/68  Pulse: 92 84 94 67  Resp: 18 18 18 11   Temp: 97.8 F (36.6 C) (!) 97.3 F (36.3 C) 97.8 F (36.6 C) 98.2 F (36.8 C)  TempSrc: Oral Oral Oral Oral  SpO2: 95% 97% 97% 99%  Weight:  93.6 kg    Height:       I/Os: I/O last 3 completed shifts: In: 8546 [P.O.:1040] Out: 24128 [phone]; Stool:3]  Physical Exam:  General: Patient is in no apparent distress Lungs: Normal respiratory effort, chest expands symmetrically. GI: The abdomen is soft and nontender without mass. Foley: 20Fr 3-way foley clear in tubing on fast CBI rate Ext: lower extremities symmetric  Lab Results: Recent Labs    08/13/19 0553 08/14/19 0342 08/15/19 0400  WBC 13.3* 13.0* 11.6*  HGB 13.5 12.4* 12.0*  HCT 41.4 37.6*  36.5*   Recent Labs    08/13/19 0553 08/14/19 0342 08/15/19 0400  NA 134* 132* 133*  K 3.9 3.7 3.7  CL 98 98 96*  CO2 24 23 24   GLUCOSE 84 100* 110*  BUN 24* 25* 22  CREATININE 1.28* 0.97 0.98  CALCIUM 8.6* 8.6* 8.7*   No results for input(s): LABPT, INR in the last 72 hours. No results for input(s): LABURIN in the last 72 hours. Results for orders placed or performed during the hospital encounter of 08/09/19  Respiratory Panel by RT PCR (Flu A&B, Covid) - Nasopharyngeal Swab     Status: None   Collection Time: 08/09/19  6:01 PM   Specimen: Nasopharyngeal Swab  Result Value Ref Range Status   SARS Coronavirus 2 by RT PCR NEGATIVE NEGATIVE Final    Comment: (NOTE) SARS-CoV-2 target nucleic acids are NOT DETECTED. The SARS-CoV-2 RNA is generally detectable in upper respiratoy specimens during the acute phase of infection. The lowest concentration of SARS-CoV-2 viral copies this assay can detect is 131 copies/mL. A negative result does not preclude SARS-Cov-2 infection and should not be used as the sole basis for treatment or other patient management decisions. A negative result may occur with  improper specimen collection/handling, submission of specimen other than nasopharyngeal swab, presence of viral mutation(s) within the areas targeted by this assay, and inadequate number of viral copies (<131 copies/mL). A negative result must be combined with clinical observations, patient history, and epidemiological information. The expected result is Negative. Fact Sheet for Patients:  PinkCheek.be Fact Sheet for Healthcare Providers:  GravelBags.it This test is not yet ap proved or cleared by the [place] FDA and  has been authorized for detection and/or diagnosis of SARS-CoV-2 by FDA under an Emergency Use Authorization (EUA). This EUA will remain  in effect (meaning this test can be used) for the duration of  the COVID-19 declaration under Section 564(b)(1) of the Act, 21 U.S.C. section 360bbb-3(b)(1), unless the authorization is terminated or revoked sooner.    Influenza A by PCR NEGATIVE NEGATIVE Final   Influenza B by PCR NEGATIVE NEGATIVE Final    Comment: (NOTE) The Xpert Xpress SARS-CoV-2/FLU/RSV assay is intended as an aid in  the diagnosis of influenza from Nasopharyngeal swab specimens and  should not be used as a sole basis for treatment. Nasal washings and  aspirates are unacceptable for Xpert Xpress SARS-CoV-2/FLU/RSV  testing. Fact Sheet for Patients: PinkCheek.be Fact Sheet for Healthcare  Providers: GravelBags.it This test is not yet approved or cleared by the [place] and  has been authorized for detection and/or diagnosis of SARS-CoV-2 by  FDA under an Emergency Use Authorization (EUA). This EUA will remain  in effect (meaning this test can be used) for the duration of the  Covid-19 declaration under Section 564(b)(1) of the Act, 21  U.S.C. section 360bbb-3(b)(1), unless the authorization is  terminated or revoked. Performed at [facility], [place] [address] [place], [place] [address]   MRSA PCR Screening     Status: None   Collection Time: 08/09/19 10:23 PM   Specimen: Nasopharyngeal  Result Value Ref Range Status   MRSA by PCR NEGATIVE NEGATIVE Final    Comment:        The GeneXpert MRSA Assay (FDA approved for NASAL specimens only), is one component of a comprehensive MRSA colonization surveillance program. It is not intended to diagnose MRSA infection nor to guide or monitor treatment for MRSA infections. Performed at [facility], [place] [address] [place], [place] [address]     Studies/Results: DG Chest 2 View  Result Date: 08/14/2019 CLINICAL DATA:  Shortness of breath EXAM: CHEST - 2 VIEW COMPARISON:  08/11/2019 FINDINGS: Cardiomegaly. Persistent atelectasis or consolidation  of the medial right lung base. The visualized skeletal structures are unremarkable. IMPRESSION: 1. Persistent atelectasis or consolidation of the medial right lung base. No new airspace opacity. 2.  Cardiomegaly. Electronically Signed   By: Eddie Candle M.D.   On: 08/14/2019 12:51   US PELVIS LIMITED (TRANSABDOMINAL ONLY)  Result Date: 08/13/2019 CLINICAL DATA:  Gross hematuria. EXAM: LIMITED ULTRASOUND OF PELVIS TECHNIQUE: Limited transabdominal ultrasound examination of the pelvis was performed. COMPARISON:  CT dated 08/13/2019 FINDINGS: The bladder is decompressed with a Foley catheter in place. Foley catheter bulb appears to be well position within the urinary bladder. The bladder wall is diffusely thickened. Prostate gland is mildly enlarged. No clear intraluminal filling defects are noted within the urinary bladder that would be concerning for blood clots. IMPRESSION: 1. The urinary bladder is decompressed by Foley catheter. The Foley catheter bulb appears well position within the urinary bladder. 2. Diffuse bladder wall thickening is again noted. No obvious blood clots are noted within the urinary bladder. Electronically Signed   By: Constance Holster M.D.   On: 08/13/2019 18:12    Assessment: Procedure(s): LEFT HEART CATH AND CORONARY ANGIOGRAPHY CORONARY STENT INTERVENTION, 6 Days Post-Op  doing well.  Plan: 69 yo man with gross hematuria of unknown etiology currently on CBI.    -decreased rate of CBI as urine was completely clear in tubing -will update Dr. Junious Silk and defer management to him -continue CBI and titrate up as needed to keep urine clear   Jacalyn Lefevre, MD Urology 08/15/2019, 2:48 PM

## 2019-08-16 ENCOUNTER — Encounter (HOSPITAL_COMMUNITY): Payer: Self-pay | Admitting: Cardiovascular Disease

## 2019-08-16 DIAGNOSIS — I441 Atrioventricular block, second degree: Secondary | ICD-10-CM | POA: Diagnosis not present

## 2019-08-16 DIAGNOSIS — I4901 Ventricular fibrillation: Secondary | ICD-10-CM | POA: Diagnosis not present

## 2019-08-16 DIAGNOSIS — R310 Gross hematuria: Secondary | ICD-10-CM | POA: Diagnosis not present

## 2019-08-16 DIAGNOSIS — I471 Supraventricular tachycardia: Secondary | ICD-10-CM

## 2019-08-16 DIAGNOSIS — I214 Non-ST elevation (NSTEMI) myocardial infarction: Secondary | ICD-10-CM

## 2019-08-16 DIAGNOSIS — I469 Cardiac arrest, cause unspecified: Secondary | ICD-10-CM | POA: Diagnosis not present

## 2019-08-16 LAB — CBC WITH DIFFERENTIAL/PLATELET
Abs Immature Granulocytes: 0.1 10*3/uL — ABNORMAL HIGH (ref 0.00–0.07)
Basophils Absolute: 0 10*3/uL (ref 0.0–0.1)
Basophils Relative: 0 %
Eosinophils Absolute: 0.4 10*3/uL (ref 0.0–0.5)
Eosinophils Relative: 3 %
HCT: 34.9 % — ABNORMAL LOW (ref 39.0–52.0)
Hemoglobin: 11.6 g/dL — ABNORMAL LOW (ref 13.0–17.0)
Immature Granulocytes: 1 %
Lymphocytes Relative: 16 %
Lymphs Abs: 2 10*3/uL (ref 0.7–4.0)
MCH: 24.9 pg — ABNORMAL LOW (ref 26.0–34.0)
MCHC: 33.2 g/dL (ref 30.0–36.0)
MCV: 75.1 fL — ABNORMAL LOW (ref 80.0–100.0)
Monocytes Absolute: 0.9 10*3/uL (ref 0.1–1.0)
Monocytes Relative: 7 %
Neutro Abs: 9.2 10*3/uL — ABNORMAL HIGH (ref 1.7–7.7)
Neutrophils Relative %: 73 %
Platelets: 345 10*3/uL (ref 150–400)
RBC: 4.65 MIL/uL (ref 4.22–5.81)
RDW: 14.9 % (ref 11.5–15.5)
WBC: 12.5 10*3/uL — ABNORMAL HIGH (ref 4.0–10.5)
nRBC: 0 % (ref 0.0–0.2)

## 2019-08-16 LAB — GLUCOSE, CAPILLARY
Glucose-Capillary: 131 mg/dL — ABNORMAL HIGH (ref 70–99)
Glucose-Capillary: 94 mg/dL (ref 70–99)
Glucose-Capillary: 107 mg/dL — ABNORMAL HIGH (ref 70–99)
Glucose-Capillary: 104 mg/dL — ABNORMAL HIGH (ref 70–99)

## 2019-08-16 LAB — BASIC METABOLIC PANEL
Anion gap: 10 (ref 5–15)
BUN: 15 mg/dL (ref 8–23)
CO2: 24 mmol/L (ref 22–32)
Calcium: 8.7 mg/dL — ABNORMAL LOW (ref 8.9–10.3)
Chloride: 100 mmol/L (ref 98–111)
Creatinine, Ser: 0.9 mg/dL (ref 0.61–1.24)
GFR calc Af Amer: >60 mL/min (ref >60)
GFR calc non Af Amer: >60 mL/min (ref >60)
Glucose, Bld: 119 mg/dL — ABNORMAL HIGH (ref 70–99)
Potassium: 3.8 mmol/L (ref 3.5–5.1)
Sodium: 134 mmol/L — ABNORMAL LOW (ref 135–145)

## 2019-08-16 NOTE — Progress Notes (Signed)
Made aware that pt is having intermittent asymptomatic bradycardia and pauses up to 3 seconds while sleeping. Pt has maintained hemodynamic stability and sleeping comfortably. He has not been experiencing bradycardia and pauses during the day--only while sleeping. There may be a component of undiagnosed OSA that may need further w/u going forward. Advised RN to make day rounding team aware.

## 2019-08-16 NOTE — Progress Notes (Signed)
Received another call from telemetry reporting HR dropped to 38 with 2.6 and 3.14 ventricular standstill. Assessed patient immediately, patient alert and oriented. Denies chest pain or any other complaints. BP 126/74 HR 81. Cardiology on-call paged again. Received call back from Dr. Hassell Done, no new orders given. Just to pass along to day shift. Cardiologist feels he may have some component of sleep apnea. Patient questioned and reports hearing that in the past. Does not wear Cpap at home. Oxygen applied via [place] at 2 liters. Continue ongoing monitoring and plan of care.

## 2019-08-16 NOTE — Progress Notes (Addendum)
Progress Note  Patient Name: John Love Date of Encounter: 08/16/2019  Primary Cardiologist: [name], MD   Subjective   Events overnight noted, feeling well this morning with no complaints.   Inpatient Medications    Scheduled Meds: . aspirin  81 mg Oral Daily  . atorvastatin  80 mg Oral q1800  . carvedilol  25 mg Oral BID WC  . Chlorhexidine Gluconate Cloth  6 each Topical Daily  . enoxaparin (LOVENOX) injection  40 mg Subcutaneous Daily  . insulin aspart  0-15 Units Subcutaneous TID AC & HS  . ramipril  2.5 mg Oral Daily  . sodium chloride flush  10-40 mL Intracatheter Q12H  . sodium chloride flush  3 mL Intravenous Q12H  . tamsulosin  0.4 mg Oral QPC supper  . ticagrelor  90 mg Oral BID   Continuous Infusions: . sodium chloride    . sodium chloride irrigation     PRN Meds: sodium chloride, acetaminophen, guaiFENesin-dextromethorphan, ipratropium-albuterol, loperamide, ondansetron (ZOFRAN) IV, simethicone, sodium chloride flush, sodium chloride flush   Vital Signs    Vitals:   08/15/19 1918 08/16/19 0248 08/16/19 0424 08/16/19 0524  BP: 109/69 114/72 126/74 138/70  Pulse: (!) 50 76 81 76  Resp: 18  18 18   Temp: 98.6 F (37 C)   (!) 97.4 F (36.3 C)  TempSrc: Oral   Oral  SpO2: 96%  98% 98%  Weight:    92.6 kg  Height:        Intake/Output Summary (Last 24 hours) at 08/16/2019 0920 Last data filed at 08/16/2019 0844 Gross per 24 hour  Intake 9440 ml  Output 10150 ml  Net -710 ml   Last 3 Weights 08/16/2019 08/15/2019 08/14/2019  Weight (lbs) 204 lb 1.6 oz 206 lb 6.4 oz 206 lb 9.6 oz  Weight (kg) 92.579 kg 93.622 kg 93.713 kg      Telemetry    SR--> nocturnal bradycardia, missed beats--> runs of atrial tach  - Personally Reviewed  ECG    No new tracing  Physical Exam  Pleasant older AAM, sitting up in bed.  GEN: No acute distress.   Neck: No JVD Cardiac: RRR, no murmurs, rubs, or gallops.  Respiratory: Diffuse rhonchi GI: Soft, nontender,  non-distended  MS: No edema; No deformity. Neuro:  Nonfocal  Psych: Normal affect   Labs    High Sensitivity Troponin:   Recent Labs  Lab 08/09/19 1807 08/09/19 2203 08/10/19 0820 08/10/19 1436  TROPONINIHS 75* 8,006* 5,166* 2,911*      Chemistry Recent Labs  Lab 08/09/19 1807 08/09/19 1900 08/14/19 0342 08/15/19 0400 08/16/19 0532  NA 135   < > 132* 133* 134*  K 3.8   < > 3.7 3.7 3.8  CL 99   < > 98 96* 100  CO2 16*   < > 23 24 24   GLUCOSE 301*   < > 100* 110* 119*  BUN 8   < > 25* 22 15  CREATININE 1.25*   < > 0.97 0.98 0.90  CALCIUM 8.7*   < > 8.6* 8.7* 8.7*  PROT 6.3*  --   --   --   --   ALBUMIN 2.7*  --   --   --   --   AST 98*  --   --   --   --   ALT 71*  --   --   --   --   ALKPHOS 74  --   --   --   --  BILITOT 0.3  --   --   --   --   GFRNONAA 59*   < > >60 >60 >60  GFRAA >60   < > >60 >60 >60  ANIONGAP 20*   < > 11 13 10    < > = values in this interval not displayed.     Hematology Recent Labs  Lab 08/14/19 0342 08/15/19 0400 08/16/19 0532  WBC 13.0* 11.6* 12.5*  RBC 4.97 4.83 4.65  HGB 12.4* 12.0* 11.6*  HCT 37.6* 36.5* 34.9*  MCV 75.7* 75.6* 75.1*  MCH 24.9* 24.8* 24.9*  MCHC 33.0 32.9 33.2  RDW 15.0 14.8 14.9  PLT 294 301 345    BNPNo results for input(s): BNP, PROBNP in the last 168 hours.   DDimer No results for input(s): DDIMER in the last 168 hours.   Radiology    DG Chest 2 View  Result Date: 08/14/2019 CLINICAL DATA:  Shortness of breath EXAM: CHEST - 2 VIEW COMPARISON:  08/11/2019 FINDINGS: Cardiomegaly. Persistent atelectasis or consolidation of the medial right lung base. The visualized skeletal structures are unremarkable. IMPRESSION: 1. Persistent atelectasis or consolidation of the medial right lung base. No new airspace opacity. 2.  Cardiomegaly. Electronically Signed   By: Eddie Candle M.D.   On: 08/14/2019 12:51    Cardiac Studies   Cath: 08/09/19    Prox RCA lesion is 80% stenosed.  Ost RCA to Prox RCA  lesion is 50% stenosed.  1st Diag lesion is 95% stenosed.  Prox LAD to Mid LAD lesion is 75% stenosed.  3rd Mrg lesion is 100% stenosed.  Mid Cx to Dist Cx lesion is 80% stenosed.  A stent was successfully placed.  Post intervention, there is a 0% residual stenosis.  A stent was successfully placed.  Post intervention, there is a 0% residual stenosis.  A drug-eluting stent was successfully placed.  Post intervention, there is a 0% residual stenosis.  Post intervention, there is a 0% residual stenosis.  IMPRESSION:Successful mid to distal circumflex stenting which was the "culprit lesion in the setting of witnessed VF arrest post Covid vaccine administration. I wonder whether this was an anaphylactic reaction creating hypotension and acute coronary artery thrombosis in the setting of three-vessel disease. He has successful RCA intervention as well. He will need staged LAD diagonal branch bifurcation intervention using orbital atherectomy given the calcific nature of the disease assuming that he neurologically recovers. I spoken to PCCM and they are aware of the patient and will manage his vent. We will discuss whether he needs Arctic sun, systemic cooling.  Quay Burow. MD, [facility]  Diagnostic Dominance: Right  Intervention   TTE: 08/10/19  IMPRESSIONS    1. Left ventricular ejection fraction, by estimation, is 55 to 60%. The  left ventricle has normal function. The left ventricle demonstrates  regional wall motion abnormalities (see scoring diagram/findings for  description). There is mild concentric left  ventricular hypertrophy. Left ventricular diastolic parameters are  consistent with Grade I diastolic dysfunction (impaired relaxation).  2. Right ventricular systolic function is normal. The right ventricular  size is normal.  3. The mitral valve is grossly normal. No evidence of mitral valve  regurgitation. No evidence of mitral stenosis.  4. The  aortic valve is grossly normal. Aortic valve regurgitation is not  visualized. No aortic stenosis is present.   Patient Profile     69 y.o. male withunknown PMH. [facility] with V.fibcardiac arrest during post COVID-19 vaccine observation.CPR was started, and  receivedshockand450mg  ofamio before ROSC. He then transferred to [facility] ED. He was intubated but awake on arrival. Of note, he vomited during king airway removal and intubation. His EKG on arrival showed some ST depression in V4,V5, V6 as well as ST elevation at AVR and he was sent to cath lab>showed 3 vessel disease.Successful mid to distal circumflexandRCA stenting performed to stabilize patoient and he will need staged LAD diagonal branch bifurcationlater (likely complicated intervention).   Assessment & Plan    1. Vfib arrest: OOH arrest that occurred after receiving COVID-19 vaccine, seizure like activity. ROSC obtained prior to hospital arrival. CPR started at scene and received 6 shocks along with amio 450. Total downtime was 20 minutes. Progressing well, working well with PT.    2. NSTEMI: underwent emergent cardiac cath noted above with successful PCI/DES to RCA and Lcx. hsTn 8006. Placed on DAPT with ASA/Brilinta. Tolerating the addition of BB and ACE. Echo with normal EF with posterior wall hypokinesis.  -- on coreg 25mg  BID, ramipril 2.5mg  daily -- per Dr. Ellyn Hack will need outpatient staged PCI/to LAD-diag but will require arterectomy.   3. Atrial tachycardia/nocturnal bradycardia: Initially placed on coreg which was titrated to 25mg  BID. Overnight developed episodes of bradycardia into the 30s with missed beats. Was asymptomatic with these episodes, continues to have bursts of a-tach. Will ask EP to consult regarding further management.   4. Hematuria: appreciate urology assistance. Has indwelling foley cath with CBI. H/H stable. -- started on tamsulosin -- urine is clear this morning. Remains on CBI,  will follow up on urology recommendations. Hopefully home today.   5. Aspiration pneumonitis: treated with 7 day course of unazyn/augmentin. Still with course rhonchi on exam. WBC trending down. Has been weaned from [place] to RA.  -- continued use of IS  For questions or updates, please contact [place] Please consult [email or web address] for contact info under   Signed, Reino Bellis, NP  08/16/2019, 9:20 AM    I have personally seen and examined this patient. I agree with the assessment and plan as outlined above.  He is feeling well this morning. He is now on a higher dose of the beta blocker but he is still having runs of atrial tach and now with pauses overnight. We will ask the EP team to see him today.  He has no chest pain. Will plan to continue DAPT with ASA and Brilinta. Continue high intensity statin and beta blocker.  He will need staged PCI of the LAD per Dr. Ellyn Hack but this will be planned as an outpatient.  Awaiting urology recs regarding foley catheter.  Anticipate d/c home tomorrow.   Lauree Chandler 08/16/2019 9:50 AM

## 2019-08-16 NOTE — Progress Notes (Signed)
7 Days Post-Op Subjective: Patient reports no complaints. CBI clear.   Objective: Vital signs in last 24 hours: Temp:  [97.4 F (36.3 C)-99.2 F (37.3 C)] 99.2 F (37.3 C) (03/02 1146) Pulse Rate:  [50-81] 53 (03/02 1146) Resp:  [18] 18 (03/02 1146) BP: (107-138)/(69-74) 107/73 (03/02 1146) SpO2:  [95 %-98 %] 95 % (03/02 1146) Weight:  [92.6 kg] 92.6 kg (03/02 0524)  Intake/Output from previous day: 03/01 0701 - 03/02 0700 In: 9200 [P.O.:200] Out: 11100 [Urine:11100] Intake/Output this shift: Total I/O In: 2390 [P.O.:480; I.V.:10; Other:1900] Out: -   Physical Exam:  CBI on moderate gtt. Clear. I increased rate and clear. I decreased rate to a slow gtt q 1-2 seconds.   Lab Results: Recent Labs    08/14/19 0342 08/15/19 0400 08/16/19 0532  HGB 12.4* 12.0* 11.6*  HCT 37.6* 36.5* 34.9*   BMET Recent Labs    08/15/19 0400 08/16/19 0532  NA 133* 134*  K 3.7 3.8  CL 96* 100  CO2 24 24  GLUCOSE 110* 119*  BUN 22 15  CREATININE 0.98 0.90  CALCIUM 8.7* 8.7*   No results for input(s): LABPT, INR in the last 72 hours. No results for input(s): LABURIN in the last 72 hours. Results for orders placed or performed during the hospital encounter of 08/09/19  Respiratory Panel by RT PCR (Flu A&B, Covid) - Nasopharyngeal Swab     Status: None   Collection Time: 08/09/19  6:01 PM   Specimen: Nasopharyngeal Swab  Result Value Ref Range Status   SARS Coronavirus 2 by RT PCR NEGATIVE NEGATIVE Final    Comment: (NOTE) SARS-CoV-2 target nucleic acids are NOT DETECTED. The SARS-CoV-2 RNA is generally detectable in upper respiratoy specimens during the acute phase of infection. The lowest concentration of SARS-CoV-2 viral copies this assay can detect is 131 copies/mL. A negative result does not preclude SARS-Cov-2 infection and should not be used as the sole basis for treatment or other patient management decisions. A negative result may occur with  improper specimen  collection/handling, submission of specimen other than nasopharyngeal swab, presence of viral mutation(s) within the areas targeted by this assay, and inadequate number of viral copies (<131 copies/mL). A negative result must be combined with clinical observations, patient history, and epidemiological information. The expected result is Negative. Fact Sheet for Patients:  PinkCheek.be Fact Sheet for Healthcare Providers:  GravelBags.it This test is not yet ap proved or cleared by the [place] FDA and  has been authorized for detection and/or diagnosis of SARS-CoV-2 by FDA under an Emergency Use Authorization (EUA). This EUA will remain  in effect (meaning this test can be used) for the duration of the COVID-19 declaration under Section 564(b)(1) of the Act, 21 U.S.C. section 360bbb-3(b)(1), unless the authorization is terminated or revoked sooner.    Influenza A by PCR NEGATIVE NEGATIVE Final   Influenza B by PCR NEGATIVE NEGATIVE Final    Comment: (NOTE) The Xpert Xpress SARS-CoV-2/FLU/RSV assay is intended as an aid in  the diagnosis of influenza from Nasopharyngeal swab specimens and  should not be used as a sole basis for treatment. Nasal washings and  aspirates are unacceptable for Xpert Xpress SARS-CoV-2/FLU/RSV  testing. Fact Sheet for Patients: PinkCheek.be Fact Sheet for Healthcare Providers: GravelBags.it This test is not yet approved or cleared by the [place] FDA and  has been authorized for detection and/or diagnosis of SARS-CoV-2 by  FDA under an Emergency Use Authorization (EUA). This EUA will remain  in effect (  meaning this test can be used) for the duration of the  Covid-19 declaration under Section 564(b)(1) of the Act, 21  U.S.C. section 360bbb-3(b)(1), unless the authorization is  terminated or revoked. Performed at [facility], [place] [address] [place], [place] [address]   MRSA PCR Screening     Status: None   Collection Time: 08/09/19 10:23 PM   Specimen: Nasopharyngeal  Result Value Ref Range Status   MRSA by PCR NEGATIVE NEGATIVE Final    Comment:        The GeneXpert MRSA Assay (FDA approved for NASAL specimens only), is one component of a comprehensive MRSA colonization surveillance program. It is not intended to diagnose MRSA infection nor to guide or monitor treatment for MRSA infections. Performed at [facility], [place] [address] [place], [place] [address]     Studies/Results: No results found.  Assessment/Plan:  Gross hematuria - wean off CBI - possible void trial/foley removal in AM. Will eventually need cystoscopy. Can be done outpatient.   BPH - continue tamsulosin    LOS: 7 days   Festus Aloe 08/16/2019, 3:51 PM

## 2019-08-16 NOTE — Progress Notes (Signed)
CARDIAC REHAB PHASE I   Offered to walk with pt. Pt states he is waiting for his wife to come. Encouraged continued ambulation. Pt hopeful for d/c tomorrow. Will try to f/u later as time allows.  Rufina Falco, RN BSN 08/16/2019 11:05 AM

## 2019-08-16 NOTE — Consult Note (Addendum)
Cardiology Consultation:   Patient ID: John Love MRN: [id number]; DOB: 1951/01/02  Admit date: 08/09/2019 Date of Consult: 08/16/2019  Primary Care Provider: Patient, No Pcp Per Primary Cardiologist: [name], MD  Primary Electrophysiologist:  None    Patient Profile:   John Love is a 69 y.o. male with no known prior medical history who is being seen today for the evaluation of PAT's and nocturnal heart block at the request of Dr. Angelena Form.  History of Present Illness:   Mr. Quinley was admitted to [facility] 08/09/2019  Had just gotten his COVID vaccine and was waiting his post injection 15 minutes when he began displaying seizure activity.  He collapsed.  His presenting rhythm was V. Fib.--Importantly, he was being monitored by an off-duty paramedic who recognized the seizure-like activity and immediately initiated ACLS identifying V. fib and started CPR. He was shocked 3 (I 1 report indicated may be 7 shocks) times and given a total of 450 mg of amiodarone by arrival to the hospital.  He had been given epinephrine, and started on an epi drip at 4 mcg/min. Upon arrival in the emergency room, he was making purposeful movements -> he was attempting to pull out IV lines in his airway.  Dr. Zenia Resides, EDP indicated that upon arrival he had been about 20 minutes in normal perfusing rhythm upon arrival and was on 4 mcg of epinephrine infusion which was converted to Levophed  ECG showed deep lateral ST depression and STE in AVR. He was taken to the cath lab and found to have severe 3 vessel disease with 80% prox RCA, 75% prox LAD/95% ostial D1 bifurcation lesion, and culprit occlusion of PLOM.  He had PCI to the prox RCA and LCx into PLOM  Treated for NSTEMI, cardiogenic shock requiring pressors initially though able to be weaned  TTE noted LVEF 55-60%, inferolateral hypokinesis and normal RV  Cardiology team notes reviewed, felt unlikely to have been an anaphylactic vaccination  reaction, suspect he could have had a vagal response to the shot triggering ACS/plaque rupture.  Culprit lesion for VF was PLOM occlusion  Also to note, appears perhaps plans for staged intervention to LAD/Diagnal in the near future  He has been observed to have recurrent brief PATs on telemetry and his BB uptitrated, last PM, noted on telemetry to have episodes of heart block with pauses EP is asked to see for recommendations and management  He was treated with abx for suspect aspiration, completed 7 days, urology is following for hematuria   LABS  K+ 3.8 BUN/Creat 15/0.90 WBC 12.5 H/H 11/34 Plts 345   The patient feels "OK", still with a cough, no CP.  He mentions that the week prior to his arrest, he was having trouble with what he thought was heartburn, though not typically an issue for him, bought a roll of TUMS for the 1st time ever and was using them He denies any prior hx of dizzy spells, no near syncope or syncope. His father died in his 51's, mentioned he has MI during a circumcision surgery, died some time shortly after that   Heart Pathway Score:     Past Medical History:  Diagnosis Date  . Cardiac arrest with ventricular fibrillation (HCC) -> 20 minutes CPR with ROSC 08/09/2019  . Multiple vessel coronary artery disease 08/10/2019   Heart Cath -PCI 08/09/2019:  CULPRIT LESION: Mid Cx to Dist Cx lesion is 80% stenosed. 3rd Mrg lesion is 100% stenosed at small branch take-off. DES PCI:  2.25 mm x 28 mm Synergy DES (proximal postdilated 2.75 mm.  Post intervention, there is a 0% residual stenosis.  Small side branch was occluded Ost RCA to Prox RCA lesion is 50% stenosed.Prox RCA lesion is 80% stenosed.  DES PCI: 3.5 mm x 24 mm Sy    Past Surgical History:  Procedure Laterality Date  . CORONARY STENT INTERVENTION N/A 08/09/2019   Procedure: CORONARY STENT INTERVENTION;  Surgeon: Lorretta Harp, MD;  Location: [place] [facility];  Service: Cardiovascular;  Laterality: N/A;    . LEFT HEART CATH AND CORONARY ANGIOGRAPHY N/A 08/09/2019   Procedure: LEFT HEART CATH AND CORONARY ANGIOGRAPHY;  Surgeon: Lorretta Harp, MD;  Location: [place] [facility];  Service: Cardiovascular;  Laterality: N/A;     Home Medications:  Prior to Admission medications   Medication Sig Start Date End Date Taking? Authorizing Provider  aspirin EC 81 MG tablet Take 81 mg by mouth daily.   Yes [provider]  Multiple Vitamin (MULTIVITAMIN ADULT PO) Take 2 tablets by mouth daily.   Yes [provider]    Inpatient Medications: Scheduled Meds: . aspirin  81 mg Oral Daily  . atorvastatin  80 mg Oral q1800  . carvedilol  25 mg Oral BID WC  . Chlorhexidine Gluconate Cloth  6 each Topical Daily  . enoxaparin (LOVENOX) injection  40 mg Subcutaneous Daily  . insulin aspart  0-15 Units Subcutaneous TID AC & HS  . ramipril  2.5 mg Oral Daily  . sodium chloride flush  10-40 mL Intracatheter Q12H  . sodium chloride flush  3 mL Intravenous Q12H  . tamsulosin  0.4 mg Oral QPC supper  . ticagrelor  90 mg Oral BID   Continuous Infusions: . sodium chloride    . sodium chloride irrigation     PRN Meds: sodium chloride, acetaminophen, guaiFENesin-dextromethorphan, ipratropium-albuterol, loperamide, ondansetron (ZOFRAN) IV, simethicone, sodium chloride flush, sodium chloride flush  Allergies:   No Known Allergies  Social History:   Social History   Socioeconomic History  . Marital status: Married    Spouse name: Not on file  . Number of children: Not on file  . Years of education: Not on file  . Highest education level: Not on file  Occupational History  . Not on file  Tobacco Use  . Smoking status: Current Every Day Smoker    Packs/day: 1.50    Types: Cigarettes  . Smokeless tobacco: Never Used  Substance and Sexual Activity  . Alcohol use: Yes    Alcohol/week: 4.0 standard drinks    Types: 4 Cans of beer per week    Comment: Daliy  . Drug use: Never  .  Sexual activity: Not on file  Other Topics Concern  . Not on file  Social History Narrative  . Not on file   Social Determinants of Health   Financial Resource Strain:   . Difficulty of Paying Living Expenses: Not on file  Food Insecurity:   . Worried About Charity fundraiser in the Last Year: Not on file  . Ran Out of Food in the Last Year: Not on file  Transportation Needs:   . Lack of Transportation (Medical): Not on file  . Lack of Transportation (Non-Medical): Not on file  Physical Activity:   . Days of Exercise per Week: Not on file  . Minutes of Exercise per Session: Not on file  Stress:   . Feeling of Stress : Not on file  Social Connections:   .  Frequency of Communication with Friends and Family: Not on file  . Frequency of Social Gatherings with Friends and Family: Not on file  . Attends Religious Services: Not on file  . Active Member of Clubs or Organizations: Not on file  . Attends Archivist Meetings: Not on file  . Marital Status: Not on file  Intimate Partner Violence:   . Fear of Current or Ex-Partner: Not on file  . Emotionally Abused: Not on file  . Physically Abused: Not on file  . Sexually Abused: Not on file    Family History:    Family History  Problem Relation Age of Onset  . Heart disease Father        died in his 56's during circumscion procedure     ROS:  Please see the history of present illness.  All other ROS reviewed and negative.     Physical Exam/Data:   Vitals:   08/15/19 1918 08/16/19 0248 08/16/19 0424 08/16/19 0524  BP: 109/69 114/72 126/74 138/70  Pulse: (!) 50 76 81 76  Resp: 18  18 18   Temp: 98.6 F (37 C)   (!) 97.4 F (36.3 C)  TempSrc: Oral   Oral  SpO2: 96%  98% 98%  Weight:    92.6 kg  Height:        Intake/Output Summary (Last 24 hours) at 08/16/2019 1123 Last data filed at 08/16/2019 0951 Gross per 24 hour  Intake 5350 ml  Output 8650 ml  Net -3300 ml   Last 3 Weights 08/16/2019 08/15/2019 08/14/2019   Weight (lbs) 204 lb 1.6 oz 206 lb 6.4 oz 206 lb 9.6 oz  Weight (kg) 92.579 kg 93.622 kg 93.713 kg     Body mass index is 27.68 kg/m.  General:  Well nourished, well developed, in no acute distress HEENT: normal Lymph: no adenopathy Neck: no JVD Endocrine:  No thryomegaly Vascular: No carotid bruits Cardiac:  RRR; no murmurs, gallops or rubs Lungs:  Some bronchial BS b/l/upper airway, clear otherwise, no wheezing, rhonchi or rales  Abd: soft, nontender  Ext: no edema Musculoskeletal:  No deformities Skin: warm and dry  Neuro:   no focal abnormalities noted Psych:  Normal affect   EKG:  The EKG was personally reviewed and demonstrates:    #1 accel junctional rhythm, 81bpm, ST depressions I, II, V4-6, elevations aVR #2 SR, resolved ST changes  Telemetry:  Telemetry was personally reviewed and demonstrates:   SR 70's Recurrent brief PATs 150-160bpm There is one time brief Aflutter lasting only a minute or so  This AM 02:24 SR, Mobitz one with one dropped beat (had proceedind  P- P and PR prolongation noted) Another shortly after also with proceeding PR prolongation (no P-P prolongation) to V pause in heart block 4.6 seconds ends with a junctional escape beat 04:13 Mobitz one (with proceeding P-P and PR prolongation V pause < 2 seconcs) 04:18 episode of Mobitz I that has P-P and PR prolongation leading to 6.2second V pause  08/14/2019 at 01:51 one dropped beat , no clear proceeding interval prolongation   Relevant CV Studies:  08/10/2019; TTE IMPRESSIONS  1. Left ventricular ejection fraction, by estimation, is 55 to 60%. The  left ventricle has normal function. The left ventricle demonstrates  regional wall motion abnormalities (see scoring diagram/findings for  description). There is mild concentric left  ventricular hypertrophy. Left ventricular diastolic parameters are  consistent with Grade I diastolic dysfunction (impaired relaxation).  2. Right ventricular  systolic function is  normal. The right ventricular  size is normal.  3. The mitral valve is grossly normal. No evidence of mitral valve  regurgitation. No evidence of mitral stenosis.  4. The aortic valve is grossly normal. Aortic valve regurgitation is not  visualized. No aortic stenosis is present.   Cath: 08/09/19   Prox RCA lesion is 80% stenosed.  Ost RCA to Prox RCA lesion is 50% stenosed.  1st Diag lesion is 95% stenosed.  Prox LAD to Mid LAD lesion is 75% stenosed.  3rd Mrg lesion is 100% stenosed.  Mid Cx to Dist Cx lesion is 80% stenosed.  A stent was successfully placed.  Post intervention, there is a 0% residual stenosis.  A stent was successfully placed.  Post intervention, there is a 0% residual stenosis.  A drug-eluting stent was successfully placed.  Post intervention, there is a 0% residual stenosis.  Post intervention, there is a 0% residual stenosis.  IMPRESSION:Successful mid to distal circumflex stenting which was the "culprit lesion in the setting of witnessed VF arrest post Covid vaccine administration. I wonder whether this was an anaphylactic reaction creating hypotension and acute coronary artery thrombosis in the setting of three-vessel disease. He has successful RCA intervention as well. He will need staged LAD diagonal branch bifurcation intervention using orbital atherectomy given the calcific nature of the disease assuming that he neurologically recovers. I spoken to PCCM and they are aware of the patient and will manage his vent. We will discuss whether he needs Arctic sun, systemic cooling.  Quay Burow. MD, [facility]  Laboratory Data:  High Sensitivity Troponin:   Recent Labs  Lab 08/09/19 1807 08/09/19 2203 08/10/19 0820 08/10/19 1436  TROPONINIHS 75* 8,006* 5,166* 2,911*     Chemistry Recent Labs  Lab 08/14/19 0342 08/15/19 0400 08/16/19 0532  NA 132* 133* 134*  K 3.7 3.7 3.8  CL 98 96* 100  CO2 23 24 24   GLUCOSE  100* 110* 119*  BUN 25* 22 15  CREATININE 0.97 0.98 0.90  CALCIUM 8.6* 8.7* 8.7*  GFRNONAA >60 >60 >60  GFRAA >60 >60 >60  ANIONGAP 11 13 10     Recent Labs  Lab 08/09/19 1807  PROT 6.3*  ALBUMIN 2.7*  AST 98*  ALT 71*  ALKPHOS 74  BILITOT 0.3   Hematology Recent Labs  Lab 08/14/19 0342 08/15/19 0400 08/16/19 0532  WBC 13.0* 11.6* 12.5*  RBC 4.97 4.83 4.65  HGB 12.4* 12.0* 11.6*  HCT 37.6* 36.5* 34.9*  MCV 75.7* 75.6* 75.1*  MCH 24.9* 24.8* 24.9*  MCHC 33.0 32.9 33.2  RDW 15.0 14.8 14.9  PLT 294 301 345   BNPNo results for input(s): BNP, PROBNP in the last 168 hours.  DDimer No results for input(s): DDIMER in the last 168 hours.   Radiology/Studies:   DG Chest 2 View Result Date: 08/14/2019 CLINICAL DATA:  Shortness of breath EXAM: CHEST - 2 VIEW COMPARISON:  08/11/2019 FINDINGS: Cardiomegaly. Persistent atelectasis or consolidation of the medial right lung base. The visualized skeletal structures are unremarkable. IMPRESSION: 1. Persistent atelectasis or consolidation of the medial right lung base. No new airspace opacity. 2.  Cardiomegaly. Electronically Signed   By: Eddie Candle M.D.   On: 08/14/2019 12:51    US PELVIS LIMITED (TRANSABDOMINAL ONLY) Result Date: 08/13/2019 CLINICAL DATA:  Gross hematuria. EXAM: LIMITED ULTRASOUND OF PELVIS TECHNIQUE: Limited transabdominal ultrasound examination of the pelvis was performed. COMPARISON:  CT dated 08/13/2019 FINDINGS: The bladder is decompressed with a Foley catheter in place. Foley catheter bulb  appears to be well position within the urinary bladder. The bladder wall is diffusely thickened. Prostate gland is mildly enlarged. No clear intraluminal filling defects are noted within the urinary bladder that would be concerning for blood clots. IMPRESSION: 1. The urinary bladder is decompressed by Foley catheter. The Foley catheter bulb appears well position within the urinary bladder. 2. Diffuse bladder wall thickening is  again noted. No obvious blood clots are noted within the urinary bladder. Electronically Signed   By: Constance Holster M.D.   On: 08/13/2019 18:12   {  Assessment and Plan:     1. PATs, nocturnal heart block     He has one very brief tracing that is AFlutter (< 1 minute), possible his PATs may be conducting this briefly 2:1       2. Nocturnal heart block     He has proceeding PR prolongation (and almost always P-P prolongation as well) that suggest vagally mediated     Only once tracing that I saw with no clear P-P or PR prolongation though also was nocturnal    The patient admits to snoring and says his woife does tell him that with this/with sleep he stops breathing for moments Very likely he has sleep apnea and would explain these Discussed with him need for sleep study out patient I do not think there is need or indication for pacing, he has no hx of syncope, dizzy spells previously   PATs, ? If brief Aflutter, agree with BB, I did not see any daytime bradycardia or heart block  Not sure he needs a/c   Dr. Rayann Heman will see later today    Care otherwise as per primary cardiology team  3. VF arrest      Culprit lesion for VF was PLOM occlusion 4. NSTEMI 5. CAD     Multivessel CAD     S/p successful PCI/DES to RCA and Lcx     Plans for staged PCI/to LAD-diag but will require arterectomy.   I discussed with the patient, no driving for 6 months     For questions or updates, please contact [place] Please consult [email or web address] for contact info under     Signed, Baldwin [place], PA-C  08/16/2019 11:23 AM;t   I have seen, examined the patient, and reviewed the above assessment and plan.  Changes to above are made where necessary.  On exam, RRR.  S/p VF arrest with culprit vessel revascularized.  No indication for ICD currently. Nocturnal bradycardia is due to increased vagal tone.  Outpatient sleep study is advised.  No indication for pacing  currently. Continue beta blocker for nonsustained atrial arrhythmias and CAD.  No driving x 6 months s/p VF arrest  Electrophysiology team to see as needed while here. Please call with questions.   Co Sign: Thompson Grayer, MD 08/16/2019 9:44 PM

## 2019-08-16 NOTE — Progress Notes (Signed)
CARDIAC REHAB PHASE I   PRE:  Rate/Rhythm: 92 SR  BP:  Sitting: 117/61      SaO2: 99 RA  MODE:  Ambulation: 200 ft   POST:  Rate/Rhythm: 110 ST  BP:  Sitting: 148/81    SaO2: 98 RA  Pt ambulated 236ft in hallway handheld assist with IV pole. Pts mobility limited due to catheter discomfort. Encouraged continued mobility. Pt states he continues to feel stronger with every walk. Pt and wife deny DME needs. Will continue to follow and encourage ambulation.  [place], RN BSN 08/16/2019 1:48 PM

## 2019-08-17 ENCOUNTER — Other Ambulatory Visit: Payer: Self-pay | Admitting: Urology

## 2019-08-17 ENCOUNTER — Inpatient Hospital Stay (HOSPITAL_COMMUNITY): Payer: BC Managed Care – PPO

## 2019-08-17 DIAGNOSIS — R310 Gross hematuria: Secondary | ICD-10-CM | POA: Diagnosis not present

## 2019-08-17 DIAGNOSIS — Z01818 Encounter for other preprocedural examination: Secondary | ICD-10-CM | POA: Diagnosis not present

## 2019-08-17 DIAGNOSIS — R570 Cardiogenic shock: Secondary | ICD-10-CM | POA: Diagnosis not present

## 2019-08-17 DIAGNOSIS — J690 Pneumonitis due to inhalation of food and vomit: Secondary | ICD-10-CM | POA: Diagnosis not present

## 2019-08-17 DIAGNOSIS — R402112 Coma scale, eyes open, never, at arrival to emergency department: Secondary | ICD-10-CM | POA: Diagnosis not present

## 2019-08-17 DIAGNOSIS — I4901 Ventricular fibrillation: Secondary | ICD-10-CM | POA: Diagnosis not present

## 2019-08-17 DIAGNOSIS — I469 Cardiac arrest, cause unspecified: Secondary | ICD-10-CM | POA: Diagnosis not present

## 2019-08-17 LAB — GLUCOSE, CAPILLARY
Glucose-Capillary: 99 mg/dL (ref 70–99)
Glucose-Capillary: 102 mg/dL — ABNORMAL HIGH (ref 70–99)
Glucose-Capillary: 113 mg/dL — ABNORMAL HIGH (ref 70–99)
Glucose-Capillary: 122 mg/dL — ABNORMAL HIGH (ref 70–99)

## 2019-08-17 MED ORDER — SODIUM CHLORIDE 0.9 % IV SOLN: INTRAVENOUS | Status: DC

## 2019-08-17 NOTE — Progress Notes (Signed)
Physical Therapy Treatment Patient Details Name: John Love MRN: [id number] DOB: March 20, 1951 Today's Date: 08/17/2019    History of Present Illness John Love is a 69 y.o. male who was admitted 2/23 after V.fib arrest following his 2nd COVID vaccine. No PMH on file    PT Comments    Pt with improved ambulation tolerance. Eager to return home. Pt cont to progress towards all goals. Acute PT to cont to follow.   Follow Up Recommendations  Home health PT;Supervision/Assistance - 24 hour (may progress well enough to not need HHPT)     Equipment Recommendations       Recommendations for Other Services       Precautions / Restrictions Precautions Precautions: Fall Precaution Comments: pt reports whoozy from change in medicines, pt also with cathetor attached to irrigation Restrictions Weight Bearing Restrictions: No RLE Weight Bearing: Non weight bearing    Mobility  Bed Mobility Overal bed mobility: Modified Independent Bed Mobility: Supine to Sit     Supine to sit: Modified independent (Device/Increase time)     General bed mobility comments: HOB elevated, increased time  Transfers Overall transfer level: Needs assistance Equipment used: None Transfers: Sit to/from Stand Sit to Stand: Min guard         General transfer comment: increased time, min guard for safety  Ambulation/Gait Ambulation/Gait assistance: Min guard Gait Distance (Feet): 200 Feet Assistive device: IV Pole Gait Pattern/deviations: Step-through pattern;Decreased stride length;Narrow base of support Gait velocity: reduc Gait velocity interpretation: 1.31 - 2.62 ft/sec, indicative of limited community ambulator General Gait Details: pt with decreased step length, decreased step height   Stairs             Wheelchair Mobility    Modified Rankin (Stroke Patients Only)       Balance Overall balance assessment: Mild deficits observed, not formally tested                                           Cognition Arousal/Alertness: Awake/alert Behavior During Therapy: WFL for tasks assessed/performed Overall Cognitive Status: Within Functional Limits for tasks assessed                                        Exercises      General Comments General comments (skin integrity, edema, etc.): pt continues with cathetor      Pertinent Vitals/Pain Pain Assessment: No/denies pain    Home Living                      Prior Function            PT Goals (current goals can now be found in the care plan section) Progress towards PT goals: Progressing toward goals    Frequency    Min 3X/week      PT Plan Current plan remains appropriate    Co-evaluation              AM-PAC PT "6 Clicks" Mobility   Outcome Measure  Help needed turning from your back to your side while in a flat bed without using bedrails?: None Help needed moving from lying on your back to sitting on the side of a flat bed without using bedrails?: A Little Help needed moving to and from a  bed to a chair (including a wheelchair)?: A Little Help needed standing up from a chair using your arms (e.g., wheelchair or bedside chair)?: A Little Help needed to walk in hospital room?: A Little Help needed climbing 3-5 steps with a railing? : A Little 6 Click Score: 19    End of Session Equipment Utilized During Treatment: Gait belt Activity Tolerance: Patient tolerated treatment well Patient left: in chair;with call bell/phone within reach;with nursing/sitter in room Nurse Communication: Mobility status PT Visit Diagnosis: Unsteadiness on feet (R26.81)     Time: 0037-9444 PT Time Calculation (min) (ACUTE ONLY): 12 min  Charges:  $Gait Training: 8-22 mins                     Kittie Plater, PT, DPT Acute Rehabilitation Services Pager #: [phone] Office #: [phone]    Berline Lopes 08/17/2019, 1:23 PM

## 2019-08-17 NOTE — Progress Notes (Signed)
Per CCMD, patient had third degree heart block at 0239. Obtained 12 lead EKG but patient was back in sinus rhythm by that time. Patient denied distress throughout reassessment. Cardiology notified.

## 2019-08-17 NOTE — Progress Notes (Signed)
Mews score of 2 due to bradycardia captured by CCMD due to a one beat run of third degree heart block. Provider has been notified that the patient has experienced these blocks several times this shift (see notes and flowsheet) - no new orders were received. Mews score of zero when patient converted back to normal sinus rhythm.

## 2019-08-17 NOTE — Progress Notes (Addendum)
8 Days Post-Op Subjective: Patient reports feeling well. Hoping to go home. Unfortunately failed CBI clamping trial with return of bloody urine.   Objective: Vital signs in last 24 hours: Temp:  [98.1 F (36.7 C)-98.7 F (37.1 C)] 98.2 F (36.8 C) (03/03 0518) Pulse Rate:  [77-81] 81 (03/03 0518) Resp:  [16-20] 18 (03/03 0518) BP: (103-134)/(66-83) 128/67 (03/03 0518) SpO2:  [97 %-98 %] 98 % (03/03 0518) Weight:  [93.4 kg] 93.4 kg (03/03 0006)  Intake/Output from previous day: 03/02 0701 - 03/03 0700 In: 733 [P.O.:720; I.V.:13] Out: 17616 [Urine:11125] Intake/Output this shift: No intake/output data recorded.  Physical Exam:  NAD Walking around room  Back in bed - red/dark bloody urine. Irrigated foley - gelatinous clots and then clearing.   Lab Results: Recent Labs    08/15/19 0400 08/16/19 0532  HGB 12.0* 11.6*  HCT 36.5* 34.9*   BMET Recent Labs    08/15/19 0400 08/16/19 0532  NA 133* 134*  K 3.7 3.8  CL 96* 100  CO2 24 24  GLUCOSE 110* 119*  BUN 22 15  CREATININE 0.98 0.90  CALCIUM 8.7* 8.7*   No results for input(s): LABPT, INR in the last 72 hours. No results for input(s): LABURIN in the last 72 hours. Results for orders placed or performed during the hospital encounter of 08/09/19  Respiratory Panel by RT PCR (Flu A&B, Covid) - Nasopharyngeal Swab     Status: None   Collection Time: 08/09/19  6:01 PM   Specimen: Nasopharyngeal Swab  Result Value Ref Range Status   SARS Coronavirus 2 by RT PCR NEGATIVE NEGATIVE Final    Comment: (NOTE) SARS-CoV-2 target nucleic acids are NOT DETECTED. The SARS-CoV-2 RNA is generally detectable in upper respiratoy specimens during the acute phase of infection. The lowest concentration of SARS-CoV-2 viral copies this assay can detect is 131 copies/mL. A negative result does not preclude SARS-Cov-2 infection and should not be used as the sole basis for treatment or other patient management decisions. A negative  result may occur with  improper specimen collection/handling, submission of specimen other than nasopharyngeal swab, presence of viral mutation(s) within the areas targeted by this assay, and inadequate number of viral copies (<131 copies/mL). A negative result must be combined with clinical observations, patient history, and epidemiological information. The expected result is Negative. Fact Sheet for Patients:  PinkCheek.be Fact Sheet for Healthcare Providers:  GravelBags.it This test is not yet ap proved or cleared by the [place] FDA and  has been authorized for detection and/or diagnosis of SARS-CoV-2 by FDA under an Emergency Use Authorization (EUA). This EUA will remain  in effect (meaning this test can be used) for the duration of the COVID-19 declaration under Section 564(b)(1) of the Act, 21 U.S.C. section 360bbb-3(b)(1), unless the authorization is terminated or revoked sooner.    Influenza A by PCR NEGATIVE NEGATIVE Final   Influenza B by PCR NEGATIVE NEGATIVE Final    Comment: (NOTE) The Xpert Xpress SARS-CoV-2/FLU/RSV assay is intended as an aid in  the diagnosis of influenza from Nasopharyngeal swab specimens and  should not be used as a sole basis for treatment. Nasal washings and  aspirates are unacceptable for Xpert Xpress SARS-CoV-2/FLU/RSV  testing. Fact Sheet for Patients: PinkCheek.be Fact Sheet for Healthcare Providers: GravelBags.it This test is not yet approved or cleared by the [place] FDA and  has been authorized for detection and/or diagnosis of SARS-CoV-2 by  FDA under an Emergency Use Authorization (EUA). This EUA will remain  in effect (meaning this test can be used) for the duration of the  Covid-19 declaration under Section 564(b)(1) of the Act, 21  U.S.C. section 360bbb-3(b)(1), unless the authorization is  terminated or  revoked. Performed at [facility], [place] [address], [place], [place] [address]   MRSA PCR Screening     Status: None   Collection Time: 08/09/19 10:23 PM   Specimen: Nasopharyngeal  Result Value Ref Range Status   MRSA by PCR NEGATIVE NEGATIVE Final    Comment:        The GeneXpert MRSA Assay (FDA approved for NASAL specimens only), is one component of a comprehensive MRSA colonization surveillance program. It is not intended to diagnose MRSA infection nor to guide or monitor treatment for MRSA infections. Performed at [facility], [place] [address], [place], [place] [address]     Studies/Results: No results found.  Assessment/Plan: Gross hematuria - check another pelvic US to rule out bladder clots. Consider cysto/fulguration vs intravesical instillation. After consulting with colleagues and Dr. Burt Knack on this difficult situation, will rule out clots by taking to OR cysto/clot evacuation tomorrow, get a good CBI catheter in under anesthesia and then start alum administration. To me looking at the pelvic US, he may have some clot under the catheter.   I called and updated nurse on the plan, bladder irrigation as needed and again CBI operation and when to stop it. Also discussed with the patient I discussed with the patient the nature, potential benefits, risks and alternatives to cystoscopy, clot evac, fulguration, catheter change and Alum instillation, including side effects of the proposed treatment, the likelihood of the patient achieving the goals of the procedure, and any potential problems that might occur during the procedure or recuperation.  All questions answered. Patient elects to proceed.    LOS: 8 days   Festus Aloe 08/17/2019, 1:00 PM

## 2019-08-17 NOTE — TOC Progression Note (Addendum)
Transition of Care [facility]) - Progression Note    Patient Details  Name: John Love MRN: [id number] Date of Birth: Feb 26, 1951  Transition of Care [facility]) CM/SW Contact  Zenon Mayo, RN Phone Number: 08/17/2019, 9:01 AM  Clinical Narrative:    NCM spoke with patient, he states his wife will be with him 24 hrs and she is able to assist him.  NCM offered choice for HHPT, HHRN, he states he really does not have a preference.  NCM made referral to [facility], awaiting call back.  Per Tiffany  She can take referral. Soc will begin 24 to 48 hrs post dc. Patient has no PCP, he has apt with Dr. Leane Platt Bonsu on 3/8 at 3 pm for new patient establishment.        Expected Discharge Plan and Services                                                 Social Determinants of Health (SDOH) Interventions    Readmission Risk Interventions No flowsheet data found.

## 2019-08-17 NOTE — Progress Notes (Addendum)
Progress Note  Patient Name: John Love Date of Encounter: 08/17/2019  Primary Cardiologist: [name], MD   Subjective   No cardiac complaints. CBI was stopped on night shift and now with what appears to be large clot in foley line.   Inpatient Medications    Scheduled Meds: . aspirin  81 mg Oral Daily  . atorvastatin  80 mg Oral q1800  . carvedilol  25 mg Oral BID WC  . Chlorhexidine Gluconate Cloth  6 each Topical Daily  . enoxaparin (LOVENOX) injection  40 mg Subcutaneous Daily  . insulin aspart  0-15 Units Subcutaneous TID AC & HS  . ramipril  2.5 mg Oral Daily  . sodium chloride flush  10-40 mL Intracatheter Q12H  . sodium chloride flush  3 mL Intravenous Q12H  . tamsulosin  0.4 mg Oral QPC supper  . ticagrelor  90 mg Oral BID   Continuous Infusions: . sodium chloride    . sodium chloride irrigation     PRN Meds: sodium chloride, acetaminophen, guaiFENesin-dextromethorphan, ipratropium-albuterol, loperamide, ondansetron (ZOFRAN) IV, simethicone, sodium chloride flush, sodium chloride flush   Vital Signs    Vitals:   08/16/19 1957 08/17/19 0006 08/17/19 0251 08/17/19 0518  BP: 103/66  134/75 128/67  Pulse: 77  78 81  Resp: 20  16 18   Temp: 98.7 F (37.1 C)  98.1 F (36.7 C) 98.2 F (36.8 C)  TempSrc: Oral  Oral   SpO2: 97%  97% 98%  Weight:  93.4 kg    Height:        Intake/Output Summary (Last 24 hours) at 08/17/2019 1052 Last data filed at 08/17/2019 0416 Gross per 24 hour  Intake 483 ml  Output 8425 ml  Net -7942 ml   Last 3 Weights 08/17/2019 08/16/2019 08/15/2019  Weight (lbs) 205 lb 14.4 oz 204 lb 1.6 oz 206 lb 6.4 oz  Weight (kg) 93.396 kg 92.579 kg 93.622 kg      Telemetry    SR with bursts of atrial tach, nocturnal bradycardia and transient HB - Personally Reviewed  ECG   SR with PVCs  Physical Exam  Pleasant older AAM, sitting up in the chair.  GEN: No acute distress.   Neck: No JVD Cardiac: RRR, no murmurs, rubs, or gallops.    Respiratory: Clear to auscultation bilaterally. GI: Soft, nontender, non-distended  MS: No edema; No deformity. Neuro:  Nonfocal  Psych: Normal affect   Labs    High Sensitivity Troponin:   Recent Labs  Lab 08/09/19 1807 08/09/19 2203 08/10/19 0820 08/10/19 1436  TROPONINIHS 75* 8,006* 5,166* 2,911*      Chemistry Recent Labs  Lab 08/14/19 0342 08/15/19 0400 08/16/19 0532  NA 132* 133* 134*  K 3.7 3.7 3.8  CL 98 96* 100  CO2 23 24 24   GLUCOSE 100* 110* 119*  BUN 25* 22 15  CREATININE 0.97 0.98 0.90  CALCIUM 8.6* 8.7* 8.7*  GFRNONAA >60 >60 >60  GFRAA >60 >60 >60  ANIONGAP 11 13 10      Hematology Recent Labs  Lab 08/14/19 0342 08/15/19 0400 08/16/19 0532  WBC 13.0* 11.6* 12.5*  RBC 4.97 4.83 4.65  HGB 12.4* 12.0* 11.6*  HCT 37.6* 36.5* 34.9*  MCV 75.7* 75.6* 75.1*  MCH 24.9* 24.8* 24.9*  MCHC 33.0 32.9 33.2  RDW 15.0 14.8 14.9  PLT 294 301 345    BNPNo results for input(s): BNP, PROBNP in the last 168 hours.   DDimer No results for input(s): DDIMER in the  last 168 hours.   Radiology    No results found.  Cardiac Studies   Cath: 08/09/19   Prox RCA lesion is 80% stenosed.  Ost RCA to Prox RCA lesion is 50% stenosed.  1st Diag lesion is 95% stenosed.  Prox LAD to Mid LAD lesion is 75% stenosed.  3rd Mrg lesion is 100% stenosed.  Mid Cx to Dist Cx lesion is 80% stenosed.  A stent was successfully placed.  Post intervention, there is a 0% residual stenosis.  A stent was successfully placed.  Post intervention, there is a 0% residual stenosis.  A drug-eluting stent was successfully placed.  Post intervention, there is a 0% residual stenosis.  Post intervention, there is a 0% residual stenosis.  IMPRESSION:Successful mid to distal circumflex stenting which was the "culprit lesion in the setting of witnessed VF arrest post Covid vaccine administration. I wonder whether this was an anaphylactic reaction creating hypotension and  acute coronary artery thrombosis in the setting of three-vessel disease. He has successful RCA intervention as well. He will need staged LAD diagonal branch bifurcation intervention using orbital atherectomy given the calcific nature of the disease assuming that he neurologically recovers. I spoken to PCCM and they are aware of the patient and will manage his vent. We will discuss whether he needs Arctic sun, systemic cooling.  Quay Burow. MD, [facility]  Diagnostic Dominance: Right  Intervention   TTE: 08/10/19  IMPRESSIONS    1. Left ventricular ejection fraction, by estimation, is 55 to 60%. The  left ventricle has normal function. The left ventricle demonstrates  regional wall motion abnormalities (see scoring diagram/findings for  description). There is mild concentric left  ventricular hypertrophy. Left ventricular diastolic parameters are  consistent with Grade I diastolic dysfunction (impaired relaxation).  2. Right ventricular systolic function is normal. The right ventricular  size is normal.  3. The mitral valve is grossly normal. No evidence of mitral valve  regurgitation. No evidence of mitral stenosis.  4. The aortic valve is grossly normal. Aortic valve regurgitation is not  visualized. No aortic stenosis is present.   Patient Profile     69 y.o. male withunknown PMH. [facility] with V.fibcardiac arrest during post COVID-19 vaccine observation.CPR was started,andreceivedshockand450mg  ofamio before ROSC. He then transferred to [facility] ED. He was intubated but awake on arrival. Of note, he vomited during king airway removal and intubation. His EKG on arrival showed some ST depression in V4,V5, V6 as well as ST elevation at AVR and he was sent to cath lab>showed 3 vessel disease.Successful mid to distal circumflexandRCA stenting performed to stabilize patoient and he will need staged LAD diagonal branch bifurcationlater (likely complicated  intervention).  Assessment & Plan    1. Vfib arrest:OOH arrest that occurred after receiving COVID-19 vaccine, seizure like activity. ROSC obtained prior to hospital arrival. CPR started at scene and received 6 shocks along with amio 450. Total downtime was 20 minutes. Progressing well, working well with PT.    2. NSTEMI:underwent emergent cardiac cath noted above with successful PCI/DES to RCA and Lcx. hsTn 8006. Placed on DAPT with ASA/Brilinta. Tolerating the addition of BB and ACE. Echo with normal EF with posterior wall hypokinesis.  -- on coreg 25mg  BID, ramipril 2.5mg  daily -- per Dr. Ellyn Hack will need outpatient staged PCI/to LAD-diag but will require arterectomy.   3. Atrial tachycardia/nocturnal bradycardia:Initially placed on coreg which was titrated to 25mg  BID. Then developed episodes of bradycardia into the 30s with missed beats. Was  asymptomatic with these episodes, continued to have bursts of a-tach. Seen by EP with recommendations for outpatient sleep study and continuation of BB therapy.   4. Hematuria:appreciate urology assistance. Has indwelling foley cath with CBI. H/H stable. -- started on tamsulosin -- urine has been clear for 2 days but CBI stopped overnight, now with clot noted in foley line. May need invasive work up while inpatient given when CBI is stopped he develops recurrent hematuria and clotting.   5. Aspiration pneumonitis: treated with 7 day course of unazyn/augmentin. WBC trending down. Has been weaned from [place] to RA.  -- continued use of IS   For questions or updates, please contact [place] Please consult [email or web address] for contact info under   Signed, Reino Bellis, NP  08/17/2019, 10:52 AM    Patient seen, examined. Available data reviewed. Agree with findings, assessment, and plan as outlined by Reino Bellis, NP. On my exam: Vitals:   08/17/19 0251 08/17/19 0518  BP: 134/75 128/67  Pulse: 78 81  Resp: 16 18  Temp: 98.1 F  (36.7 C) 98.2 F (36.8 C)  SpO2: 97% 98%   Pt is alert and oriented, NAD HEENT: normal Neck: JVP - normal Lungs: CTA bilaterally CV: RRR without murmur or gallop Abd: soft, NT, Positive BS, no hepatomegaly Ext: no C/C/E, distal pulses intact and equal Skin: warm/dry no rash Foley with blood in tubing  Appreciate EP evaluation and recommendations noted for continued beta blocker and OP sleep study. Otherwise continue current medical therapy. Await urology recommendations for hematuria. Pt otherwise medically stable for discharge.  Sherren Mocha, M.D. 08/17/2019 11:58 AM

## 2019-08-17 NOTE — Progress Notes (Signed)
CARDIAC REHAB PHASE I   Went to offer to walk with pt. Pt out of room for testing. Spoke with wife who was at bedside. Provided support and encouragement. Will continue to follow pt and encourage ambulation as able.  [phone] Rufina Falco, RN BSN 08/17/2019 2:56 PM

## 2019-08-17 NOTE — TOC Transition Note (Addendum)
Transition of Care [facility]) - CM/SW Discharge Note   Patient Details  Name: John Love MRN: [id number] Date of Birth: 1951/02/12  Transition of [facility]) CM/SW Contact:  Zenon Mayo, RN Phone Number: 08/17/2019, 9:46 AM   Clinical Narrative:    NCM spoke with patient, he states his wife will be with him 24 hrs and she is able to assist him.  NCM offered choice for HHPT, HHRN, he states he really does not have a preference.  NCM made referral to [facility], awaiting call back.  Per Tiffany  She can take referral. Soc will begin 24 to 48 hrs post dc. Patient has no PCP, he has apt with Dr. Leane Platt Bonsu on 3/8 at 3 pm for new patient establishment.  Will need HHRN, HHPT orders with face to face.    Final next level of care: [place] Barriers to Discharge: Continued Medical Work up   Patient Goals and CMS Choice Patient states their goals for this hospitalization and ongoing recovery are:: get better CMS Medicare.gov Compare Post Acute Care list provided to:: Patient Choice offered to / list presented to : Patient  Discharge Placement                       Discharge Plan and Services                DME Arranged: (NA)           HH Agency: [facility]formerly [employer]) Date [place]: 08/17/19 Time [place]: [phone] Representative spoke with at [place]: [place] ([place]) Interventions     Readmission Risk Interventions No flowsheet data found.

## 2019-08-17 NOTE — Progress Notes (Signed)
CCMD called to notify that they identified one beat of complete heart block. Denies distress. Cardiology notified.

## 2019-08-17 NOTE — Progress Notes (Signed)
Mews score 2 due to HR. CCMD called to report two 20 beat runs of SVT. The timing coincided with ambulating and weighing the patient. He has a history of non-sustained SVT; this is not an acute change. He denies any distress. Patient is in bed resting. Heart rate is now within normal limits.

## 2019-08-18 ENCOUNTER — Inpatient Hospital Stay (HOSPITAL_COMMUNITY): Payer: BC Managed Care – PPO | Admitting: Certified Registered"

## 2019-08-18 ENCOUNTER — Encounter (HOSPITAL_COMMUNITY): Payer: Self-pay | Admitting: Cardiovascular Disease

## 2019-08-18 ENCOUNTER — Encounter (HOSPITAL_COMMUNITY)
Admission: EM | Disposition: A | Discharge status: Home Health | Payer: Self-pay | Source: Home / Self Care | Attending: Cardiovascular Disease

## 2019-08-18 DIAGNOSIS — I4901 Ventricular fibrillation: Secondary | ICD-10-CM | POA: Diagnosis not present

## 2019-08-18 DIAGNOSIS — N3021 Other chronic cystitis with hematuria: Secondary | ICD-10-CM | POA: Diagnosis not present

## 2019-08-18 DIAGNOSIS — N320 Bladder-neck obstruction: Secondary | ICD-10-CM | POA: Diagnosis not present

## 2019-08-18 DIAGNOSIS — N400 Enlarged prostate without lower urinary tract symptoms: Secondary | ICD-10-CM | POA: Diagnosis not present

## 2019-08-18 DIAGNOSIS — I25810 Atherosclerosis of coronary artery bypass graft(s) without angina pectoris: Secondary | ICD-10-CM | POA: Diagnosis not present

## 2019-08-18 DIAGNOSIS — N3289 Other specified disorders of bladder: Secondary | ICD-10-CM | POA: Diagnosis not present

## 2019-08-18 DIAGNOSIS — Z7901 Long term (current) use of anticoagulants: Secondary | ICD-10-CM | POA: Diagnosis not present

## 2019-08-18 DIAGNOSIS — I469 Cardiac arrest, cause unspecified: Secondary | ICD-10-CM | POA: Diagnosis not present

## 2019-08-18 DIAGNOSIS — N179 Acute kidney failure, unspecified: Secondary | ICD-10-CM | POA: Diagnosis not present

## 2019-08-18 HISTORY — PX: CYSTOSCOPY: SHX5120

## 2019-08-18 LAB — BASIC METABOLIC PANEL
Anion gap: 11 (ref 5–15)
BUN: 21 mg/dL (ref 8–23)
CO2: 25 mmol/L (ref 22–32)
Calcium: 8.8 mg/dL — ABNORMAL LOW (ref 8.9–10.3)
Chloride: 101 mmol/L (ref 98–111)
Creatinine, Ser: 1.07 mg/dL (ref 0.61–1.24)
GFR calc Af Amer: >60 mL/min (ref >60)
GFR calc non Af Amer: >60 mL/min (ref >60)
Glucose, Bld: 103 mg/dL — ABNORMAL HIGH (ref 70–99)
Potassium: 3.9 mmol/L (ref 3.5–5.1)
Sodium: 137 mmol/L (ref 135–145)

## 2019-08-18 LAB — CBC
HCT: 33.4 % — ABNORMAL LOW (ref 39.0–52.0)
Hemoglobin: 11 g/dL — ABNORMAL LOW (ref 13.0–17.0)
MCH: 24.9 pg — ABNORMAL LOW (ref 26.0–34.0)
MCHC: 32.9 g/dL (ref 30.0–36.0)
MCV: 75.7 fL — ABNORMAL LOW (ref 80.0–100.0)
Platelets: 387 10*3/uL (ref 150–400)
RBC: 4.41 MIL/uL (ref 4.22–5.81)
RDW: 14.8 % (ref 11.5–15.5)
WBC: 12.5 10*3/uL — ABNORMAL HIGH (ref 4.0–10.5)
nRBC: 0 % (ref 0.0–0.2)

## 2019-08-18 LAB — SURGICAL PCR SCREEN
MRSA, PCR: NEGATIVE
Staphylococcus aureus: NEGATIVE

## 2019-08-18 LAB — GLUCOSE, CAPILLARY
Glucose-Capillary: 107 mg/dL — ABNORMAL HIGH (ref 70–99)
Glucose-Capillary: 115 mg/dL — ABNORMAL HIGH (ref 70–99)
Glucose-Capillary: 142 mg/dL — ABNORMAL HIGH (ref 70–99)

## 2019-08-18 SURGERY — CYSTOSCOPY
Anesthesia: General | Site: Bladder

## 2019-08-18 MED ORDER — PROPOFOL 10 MG/ML IV BOLUS
INTRAVENOUS | Status: DC | PRN
  Administered 2019-08-18: 80 mg via INTRAVENOUS

## 2019-08-18 MED ORDER — FENTANYL CITRATE (PF) 250 MCG/5ML IJ SOLN
INTRAMUSCULAR | Status: AC
  Filled 2019-08-18: qty 5

## 2019-08-18 MED ORDER — SUCCINYLCHOLINE CHLORIDE 200 MG/10ML IV SOSY
PREFILLED_SYRINGE | INTRAVENOUS | Status: DC | PRN
  Administered 2019-08-18: 100 mg via INTRAVENOUS

## 2019-08-18 MED ORDER — ACETAMINOPHEN 500 MG PO TABS: 1000.0000 mg | ORAL_TABLET | Freq: Once | ORAL | Status: DC

## 2019-08-18 MED ORDER — FENTANYL CITRATE (PF) 100 MCG/2ML IJ SOLN: 25.0000 ug | INTRAMUSCULAR | Status: DC | PRN

## 2019-08-18 MED ORDER — PROPOFOL 10 MG/ML IV BOLUS
INTRAVENOUS | Status: AC
  Filled 2019-08-18: qty 20

## 2019-08-18 MED ORDER — MIDAZOLAM HCL 5 MG/5ML IJ SOLN
INTRAMUSCULAR | Status: DC | PRN
  Administered 2019-08-18 (×2): 1 mg via INTRAVENOUS

## 2019-08-18 MED ORDER — DEXAMETHASONE SODIUM PHOSPHATE 10 MG/ML IJ SOLN
INTRAMUSCULAR | Status: AC
  Filled 2019-08-18: qty 1

## 2019-08-18 MED ORDER — FENTANYL CITRATE (PF) 100 MCG/2ML IJ SOLN
INTRAMUSCULAR | Status: DC | PRN
  Administered 2019-08-18: 100 ug via INTRAVENOUS

## 2019-08-18 MED ORDER — STERILE WATER FOR IRRIGATION IR SOLN
Status: DC | PRN
  Administered 2019-08-18: 6000 mL

## 2019-08-18 MED ORDER — CIPROFLOXACIN IN D5W 400 MG/200ML IV SOLN
400.0000 mg | Freq: Once | INTRAVENOUS | Status: DC
  Filled 2019-08-18: qty 200

## 2019-08-18 MED ORDER — ONDANSETRON HCL 4 MG/2ML IJ SOLN
INTRAMUSCULAR | Status: DC | PRN
  Administered 2019-08-18: 4 mg via INTRAVENOUS

## 2019-08-18 MED ORDER — LACTATED RINGERS IV SOLN: INTRAVENOUS | Status: DC

## 2019-08-18 MED ORDER — DEXAMETHASONE SODIUM PHOSPHATE 10 MG/ML IJ SOLN
INTRAMUSCULAR | Status: DC | PRN
  Administered 2019-08-18: 5 mg via INTRAVENOUS

## 2019-08-18 MED ORDER — ONDANSETRON HCL 4 MG/2ML IJ SOLN
INTRAMUSCULAR | Status: AC
  Filled 2019-08-18: qty 2

## 2019-08-18 MED ORDER — SODIUM CHLORIDE 0.9 % IR SOLN
Status: DC | PRN
  Administered 2019-08-18: 3000 mL

## 2019-08-18 MED ORDER — LIDOCAINE 2% (20 MG/ML) 5 ML SYRINGE
INTRAMUSCULAR | Status: AC
  Filled 2019-08-18: qty 5

## 2019-08-18 MED ORDER — PHENYLEPHRINE 40 MCG/ML (10ML) SYRINGE FOR IV PUSH (FOR BLOOD PRESSURE SUPPORT)
PREFILLED_SYRINGE | INTRAVENOUS | Status: DC | PRN
  Administered 2019-08-18: 80 ug via INTRAVENOUS
  Administered 2019-08-18: 120 ug via INTRAVENOUS
  Administered 2019-08-18 (×6): 80 ug via INTRAVENOUS

## 2019-08-18 MED ORDER — SODIUM CHLORIDE 0.9 % IV SOLN
1.0000 g | INTRAVENOUS | Status: AC
  Administered 2019-08-18 – 2019-08-19 (×2): 1 g via INTRAVENOUS
  Filled 2019-08-18 (×2): qty 10

## 2019-08-18 MED ORDER — MIDAZOLAM HCL 2 MG/2ML IJ SOLN
INTRAMUSCULAR | Status: AC
  Filled 2019-08-18: qty 2

## 2019-08-18 MED ORDER — LIDOCAINE 2% (20 MG/ML) 5 ML SYRINGE
INTRAMUSCULAR | Status: DC | PRN
  Administered 2019-08-18: 60 mg via INTRAVENOUS

## 2019-08-18 SURGICAL SUPPLY — 29 items
BAG URINE DRAIN 2000ML AR STRL (UROLOGICAL SUPPLIES) ×4 IMPLANT
BAG URO CATCHER STRL LF (MISCELLANEOUS) ×4 IMPLANT
CATH FOLEY 2WAY SLVR  5CC 16FR (CATHETERS)
CATH FOLEY 2WAY SLVR 5CC 16FR (CATHETERS) IMPLANT
CATH FOLEY 3WAY 30CC 20FR (CATHETERS) ×2 IMPLANT
CATH HEMATURIA 20FR (CATHETERS) ×2 IMPLANT
CATH INTERMIT  6FR 70CM (CATHETERS) IMPLANT
CATH URET 5FR 28IN OPEN ENDED (CATHETERS) IMPLANT
GLOVE BIO SURGEON STRL SZ7.5 (GLOVE) ×4 IMPLANT
GOWN STRL REUS W/ TWL LRG LVL3 (GOWN DISPOSABLE) ×1 IMPLANT
GOWN STRL REUS W/ TWL XL LVL3 (GOWN DISPOSABLE) ×1 IMPLANT
GOWN STRL REUS W/TWL LRG LVL3 (GOWN DISPOSABLE) ×1
GOWN STRL REUS W/TWL XL LVL3 (GOWN DISPOSABLE) ×1
GUIDEWIRE ANG ZIPWIRE 038X150 (WIRE) IMPLANT
GUIDEWIRE STR DUAL SENSOR (WIRE) ×2 IMPLANT
KIT TURNOVER KIT B (KITS) ×2 IMPLANT
MANIFOLD NEPTUNE II (INSTRUMENTS) IMPLANT
NS IRRIG 1000ML POUR BTL (IV SOLUTION) ×2 IMPLANT
PACK CYSTO (CUSTOM PROCEDURE TRAY) ×2 IMPLANT
PAD ARMBOARD 7.5X6 YLW CONV (MISCELLANEOUS) ×4 IMPLANT
STENT URET 6FRX24 CONTOUR (STENTS) IMPLANT
STENT URET 6FRX26 CONTOUR (STENTS) IMPLANT
SYPHON OMNI JUG (MISCELLANEOUS) ×2 IMPLANT
SYR 20ML LL LF (SYRINGE) ×2 IMPLANT
SYR TOOMEY IRRIG 70ML (MISCELLANEOUS) ×2
SYRINGE TOOMEY IRRIG 70ML (MISCELLANEOUS) ×1 IMPLANT
TOWEL GREEN STERILE FF (TOWEL DISPOSABLE) ×2 IMPLANT
TUBE CONNECTING 12X1/4 (SUCTIONS) IMPLANT
UNDERPAD 30X30 (UNDERPADS AND DIAPERS) ×2 IMPLANT

## 2019-08-18 NOTE — Op Note (Signed)
Preoperative diagnosis: Gross hematuria, BPH, chronic cystitis, hemorrhagic cystitis Postoperative diagnosis: Gross hematuria, BPH, chronic cystitis, hemorrhagic cystitis  Procedure: Cystoscopy with clot evacuation and fulguration 0.5 to 2 cm, catheter change and reinitiation of CBI  Surgeon: Junious Silk  Anesthesia: General  Indication for procedure: John Love is a 69 year old male with refractory hematuria and clots to CBI. He had gross hematuria and thickened bladder on CT prior to foley catheter placement. I have hand irrigated him at the bedside multiple times over the past few days and could not get all the clots out.  He continued to have gross hematuria and clots on a pelvic ultrasound.  These needed to be evacuated, and any active bleeding fulgurated. Urine sent for culture which is growing e coli. Sign of infection (hematuria and thickwalled bladder) noted prior to foley placement. This is not a CAUTI.   Findings: On exam under anesthesia the penis was circumcised without mass or lesion.  Testicles descended bilaterally and palpably normal.  Scrotum was normal.  On digital rectal exam the prostate was about 60 g without hard area or nodule.  Capsule felt intact.  All landmarks normal.  On cystoscopy the urethra was unremarkable, prostatic urethra showed moderate hyperplasia with obstruction from lateral lobes and a high bladder neck.  There was intravesical extension of the prostate into the bladder.  Trigone and ureteral orifice ease appeared normal with clear reflux.  No stone or foreign body in the bladder.  No mucosal tumors.  Bladder mucosa diffusely edematous with submucosal erythema.  There was about 200 cc of clot in the bladder which was evacuated.  Once this was done there were 2 small areas of bleeding posteriorly which were fulgurated with the Bugbee electrode.  These were about a centimeter each.  There were no worrisome finding.  I do not think he has had any ongoing bleeding over  the past few days, but given the large prostate and extension into the bladder it would have been very difficult to irrigate these clots on the floor. Bleeding was likely from chronic cystitis and bladder outlet obstruction from BPH.  I do not think he will need alum irrigation.  Description of procedure: After consent was obtained patient brought to the operating room.  After adequate anesthesia he was placed in lithotomy position and prepped and draped in the usual sterile fashion.  A timeout was performed to confirm the patient and procedure.  The cystoscope was passed per urethra and the bladder inspected.  There was a good amount of clot in the bladder.  This was evacuated with a Toomey syringe.  Reinspection noted there to be 2 small areas of mucosal bleeding posterior.  These were fulgurated with excellent hemostasis.  Careful inspection at low pressure revealed there to be no active bleeding.  Although the prostate was enlarged with an elongated prostatic urethra there was no bleeding of the prostate.  A 20 French three-way catheter was carefully passed without difficulty.  10 cc in the balloon and seated at the bladder neck and CBI reinitiated for wake up.  He was then awakened taken recovery room in stable condition.  Complications: None  Blood loss: Minimal  Specimens: None  Drains: 20 French three-way CBI catheter.  Disposition: Patient stable to PACU

## 2019-08-18 NOTE — Anesthesia Procedure Notes (Signed)
Procedure Name: Intubation Date/Time: 08/18/2019 12:15 PM Performed by: Moshe Salisbury, CRNA Pre-anesthesia Checklist: Patient identified, Emergency Drugs available, Suction available and Patient being monitored Patient Re-evaluated:Patient Re-evaluated prior to induction Oxygen Delivery Method: Circle System Utilized Preoxygenation: Pre-oxygenation with 100% oxygen Induction Type: IV induction Ventilation: Mask ventilation without difficulty Laryngoscope Size: Mac and 4 Grade View: Grade I Tube type: Oral Tube size: 7.5 mm Number of attempts: 1 Airway Equipment and Method: Stylet Placement Confirmation: ETT inserted through vocal cords under direct vision,  positive ETCO2 and breath sounds checked- equal and bilateral Secured at: 21 cm Tube secured with: Tape Dental Injury: Teeth and Oropharynx as per pre-operative assessment

## 2019-08-18 NOTE — Progress Notes (Signed)
Day of Surgery Subjective: Patient reports no complaints. Urine remains red to pink on moderate CBI. Hgb down again now to 11. Spoke with nurse yesterday evening about 5 pm and there were clots developing and I gave to hand irrigate. Urine Cx growing > 100K e coli. Rocephin ordered and just getting started.   Objective: Vital signs in last 24 hours: Temp:  [98.1 F (36.7 C)-99.2 F (37.3 C)] 98.1 F (36.7 C) (03/04 0808) Pulse Rate:  [48-86] 77 (03/04 0808) Resp:  [16-20] 16 (03/04 0808) BP: (96-116)/(58-83) 112/67 (03/04 0808) SpO2:  [98 %-100 %] 98 % (03/04 0808) Weight:  [91 kg] 91 kg (03/04 1057)  Intake/Output from previous day: 03/03 0701 - 03/04 0700 In: 6444.1 [P.O.:240; I.V.:204.1] Out: 11550 [YCXKG:81856] Intake/Output this shift: Total I/O In: 1550 [P.O.:50; Other:1500] Out: 3750 [Urine:3750]  Physical Exam:  NAD Alert and O in  Pre-op Urine pink in tubing on a moderate CBI gtt and red in bag.    Lab Results: Recent Labs    08/16/19 0532 08/18/19 0314  HGB 11.6* 11.0*  HCT 34.9* 33.4*   BMET Recent Labs    08/16/19 0532 08/18/19 0314  NA 134* 137  K 3.8 3.9  CL 100 101  CO2 24 25  GLUCOSE 119* 103*  BUN 15 21  CREATININE 0.90 1.07  CALCIUM 8.7* 8.8*   No results for input(s): LABPT, INR in the last 72 hours. No results for input(s): LABURIN in the last 72 hours. Results for orders placed or performed during the hospital encounter of 08/09/19  Respiratory Panel by RT PCR (Flu A&B, Covid) - Nasopharyngeal Swab     Status: None   Collection Time: 08/09/19  6:01 PM   Specimen: Nasopharyngeal Swab  Result Value Ref Range Status   SARS Coronavirus 2 by RT PCR NEGATIVE NEGATIVE Final    Comment: (NOTE) SARS-CoV-2 target nucleic acids are NOT DETECTED. The SARS-CoV-2 RNA is generally detectable in upper respiratoy specimens during the acute phase of infection. The lowest concentration of SARS-CoV-2 viral copies this assay can detect is 131  copies/mL. A negative result does not preclude SARS-Cov-2 infection and should not be used as the sole basis for treatment or other patient management decisions. A negative result may occur with  improper specimen collection/handling, submission of specimen other than nasopharyngeal swab, presence of viral mutation(s) within the areas targeted by this assay, and inadequate number of viral copies (<131 copies/mL). A negative result must be combined with clinical observations, patient history, and epidemiological information. The expected result is Negative. Fact Sheet for Patients:  PinkCheek.be Fact Sheet for Healthcare Providers:  GravelBags.it This test is not yet ap proved or cleared by the [place] FDA and  has been authorized for detection and/or diagnosis of SARS-CoV-2 by FDA under an Emergency Use Authorization (EUA). This EUA will remain  in effect (meaning this test can be used) for the duration of the COVID-19 declaration under Section 564(b)(1) of the Act, 21 U.S.C. section 360bbb-3(b)(1), unless the authorization is terminated or revoked sooner.    Influenza A by PCR NEGATIVE NEGATIVE Final   Influenza B by PCR NEGATIVE NEGATIVE Final    Comment: (NOTE) The Xpert Xpress SARS-CoV-2/FLU/RSV assay is intended as an aid in  the diagnosis of influenza from Nasopharyngeal swab specimens and  should not be used as a sole basis for treatment. Nasal washings and  aspirates are unacceptable for Xpert Xpress SARS-CoV-2/FLU/RSV  testing. Fact Sheet for Patients: PinkCheek.be Fact Sheet for Healthcare Providers:  GravelBags.it This test is not yet approved or cleared by the [place] and  has been authorized for detection and/or diagnosis of SARS-CoV-2 by  FDA under an Emergency Use Authorization (EUA). This EUA will remain  in effect (meaning this test can  be used) for the duration of the  Covid-19 declaration under Section 564(b)(1) of the Act, 21  U.S.C. section 360bbb-3(b)(1), unless the authorization is  terminated or revoked. Performed at [facility], [place] [address] [place], [place] [address]   MRSA PCR Screening     Status: None   Collection Time: 08/09/19 10:23 PM   Specimen: Nasopharyngeal  Result Value Ref Range Status   MRSA by PCR NEGATIVE NEGATIVE Final    Comment:        The GeneXpert MRSA Assay (FDA approved for NASAL specimens only), is one component of a comprehensive MRSA colonization surveillance program. It is not intended to diagnose MRSA infection nor to guide or monitor treatment for MRSA infections. Performed at [facility], [place] [address] [place], [place] [address]   Surgical pcr screen     Status: None   Collection Time: 08/17/19  1:03 AM   Specimen: Nasal Mucosa; Nasal Swab  Result Value Ref Range Status   MRSA, PCR NEGATIVE NEGATIVE Final   Staphylococcus aureus NEGATIVE NEGATIVE Final    Comment: (NOTE) The Xpert SA Assay (FDA approved for NASAL specimens in patients 68 years of age and older), is one component of a comprehensive surveillance program. It is not intended to diagnose infection nor to guide or monitor treatment. Performed at [facility], [place] [address] [place], [place] [address]   Culture, Urine     Status: Abnormal (Preliminary result)   Collection Time: 08/17/19  1:03 PM   Specimen: Urine, Catheterized  Result Value Ref Range Status   Specimen Description URINE, CATHETERIZED  Final   Special Requests NONE  Final   Culture (A)  Final    >=100,000 COLONIES/mL ESCHERICHIA COLI SUSCEPTIBILITIES TO FOLLOW Performed at [facility], [place] [address] [place], [place] [address]    Report Status PENDING  Incomplete    Studies/Results: DG Chest 2 View  Result Date: 08/17/2019 CLINICAL DATA:  Preoperative evaluation, smoker, coronary artery disease EXAM:  CHEST - 2 VIEW COMPARISON:  08/14/2019 FINDINGS: Upper normal size of cardiac silhouette. Mediastinal contours and pulmonary vascularity normal. RIGHT lower lobe atelectasis versus consolidation unchanged. Remaining lungs clear. No pleural effusion or pneumothorax. Mild levoconvex thoracic scoliosis. IMPRESSION: Persistent atelectasis versus consolidation of RIGHT lower lobe. Electronically Signed   By: Lavonia Dana M.D.   On: 08/17/2019 20:47   US PELVIS LIMITED (TRANSABDOMINAL ONLY)  Result Date: 08/17/2019 CLINICAL DATA:  Gross hematuria EXAM: LIMITED ULTRASOUND OF PELVIS TECHNIQUE: Limited transabdominal ultrasound examination of the pelvis was performed. COMPARISON:  Ultrasound dated 08/13/2019 FINDINGS: The Foley catheter bulb appears to be well position within the urinary bladder. There is diffuse bladder wall thickening. There is a small amount of debris adjacent to the distal aspect of the Foley catheter. IMPRESSION: 1. Foley catheter appears to be position within the urinary bladder. 2. Persistent diffuse bladder wall thickening. 3. Complex debris about the distal aspect of the Foley catheter may represent a small amount of blood products. No large intraluminal blood clot identified on this ultrasound. Electronically Signed   By: Constance Holster M.D.   On: 08/17/2019 16:30    Assessment/Plan:  -hemorrhagic cystitis - likely related to chronic cystitis.  Plan cysto, clot evac and CBI with alum irrigation if needed this afternoon knowing bladder clot free (clots with after alum can be difficult to remove). [place] yesterday afternoon showed some clot and / or debris in bladder.   Chronic cystitis - pt with hematuria and bladder wall thickening on CT scan c/w with chronic cystitis. Pt never with other signs of infection (fever, significantly high wbc, dysuria or bladder pain). Confirmation with urine cx + for e coli 08/17/3019. Start IV abx. Covert to po once sensitivity back. This is not a CAUTI. Signs  and symptoms present prior to catheter.   BPH - continue tamsulosin. Will check EUA/DRE.   I discussed with the patient the nature, potential benefits, risks and alternatives to cystoscopy, bladder biopsy and fulguration as well as alum CBI if needed, including side effects of the proposed treatments, the likelihood of the patient achieving the goals of the procedure, and any potential problems that might occur during the procedure or recuperation. We also discussed + urine cx and abx treatment. All questions answered. Patient elects to proceed.      LOS: 9 days   John Love 08/18/2019, 12:00 PM

## 2019-08-18 NOTE — Progress Notes (Signed)
PT Cancellation Note  Patient Details Name: John Love MRN: [id number] DOB: [birth year]   Cancelled Treatment:    Reason Eval/Treat Not Completed: Patient at procedure or test/unavailable Pt at procedure earlier today and on CBI in afternoon and declined PT.  Will f/u as able.  [name], PT Acute Rehab Services Pager [phone] [facility] [phone] [facility] [phone]   [name] 08/18/2019, 4:42 PM

## 2019-08-18 NOTE — Progress Notes (Signed)
  Pt seen back on floor. Urine clear on a slow CBI.  If urine remains clear, clamp CBI at 5 AM.  Hopefully, we can pull the foley in the AM.  Cystitis - change to po abx once sensitivities are back or can d/c on cephalexin po BID.   BPH - cont tamsulosin

## 2019-08-18 NOTE — Anesthesia Preprocedure Evaluation (Addendum)
Anesthesia Evaluation  Patient identified by MRN, date of birth, ID band Patient awake    Reviewed: Allergy & Precautions, H&P , NPO status , Patient's Chart, lab work & pertinent test results  Airway Mallampati: III  TM Distance: >3 FB Neck ROM: Full    Dental no notable dental hx. (+) Edentulous Upper, Edentulous Lower, Dental Advisory Given   Pulmonary Current Smoker and Patient abstained from smoking.,    Pulmonary exam normal breath sounds clear to auscultation       Cardiovascular + CAD and + Cardiac Stents  Ventricular Fibrillation  Rhythm:Regular Rate:Normal     Neuro/Psych negative neurological ROS  negative psych ROS   GI/Hepatic negative GI ROS, Neg liver ROS,   Endo/Other  negative endocrine ROS  Renal/GU negative Renal ROS  negative genitourinary   Musculoskeletal   Abdominal   Peds  Hematology negative hematology ROS (+)   Anesthesia Other Findings   Reproductive/Obstetrics negative OB ROS                            Anesthesia Physical Anesthesia Plan  ASA: III  Anesthesia Plan: General   Post-op Pain Management:    Induction: Intravenous  PONV Risk Score and Plan: 2 and Ondansetron, Dexamethasone and Midazolam  Airway Management Planned: Oral ETT  Additional Equipment:   Intra-op Plan:   Post-operative Plan: Extubation in OR  Informed Consent: I have reviewed the patients History and Physical, chart, labs and discussed the procedure including the risks, benefits and alternatives for the proposed anesthesia with the patient or authorized representative who has indicated his/her understanding and acceptance.     Dental advisory given  Plan Discussed with: CRNA  Anesthesia Plan Comments:         Anesthesia Quick Evaluation

## 2019-08-18 NOTE — Progress Notes (Signed)
Per Urology DR Junious Silk, turn CBI off at 5 am 3/5. Dr will see pt around 8 am. IF there is no more blot, foley will be removed.

## 2019-08-18 NOTE — Anesthesia Postprocedure Evaluation (Signed)
Anesthesia Post Note  Patient: John Love  Procedure(s) Performed: CYSTOSCOPY CLOT EVACUATION FULGURATION 0.5-2 CM. (N/A Bladder)     Patient location during evaluation: PACU Anesthesia Type: General Level of consciousness: awake and alert Pain management: pain level controlled Vital Signs Assessment: post-procedure vital signs reviewed and stable Respiratory status: spontaneous breathing, nonlabored ventilation, respiratory function stable and patient connected to nasal cannula oxygen Cardiovascular status: blood pressure returned to baseline and stable Postop Assessment: no apparent nausea or vomiting Anesthetic complications: no    Last Vitals:  Vitals:   08/18/19 1357 08/18/19 1402  BP:  112/61  Pulse: 80   Resp: 19   Temp:    SpO2: 99%     Last Pain:  Vitals:   08/18/19 1315  TempSrc:   PainSc: 0-No pain                 Laron Angelini,W. EDMOND

## 2019-08-18 NOTE — Transfer of Care (Signed)
Immediate Anesthesia Transfer of Care Note  Patient: John Love  Procedure(s) Performed: CYSTOSCOPY CLOT EVACUATION FULGURATION 0.5-2 CM. (N/A Bladder)  Patient Location: PACU  Anesthesia Type:General  Level of Consciousness: drowsy and patient cooperative  Airway & Oxygen Therapy: Patient Spontanous Breathing and Patient connected to nasal cannula oxygen  Post-op Assessment: Report given to RN, Post -op Vital signs reviewed and stable and Patient moving all extremities  Post vital signs: Reviewed and stable  Last Vitals:  Vitals Value Taken Time  BP 94/58 08/18/19 1314  Temp    Pulse 77 08/18/19 1315  Resp 22 08/18/19 1315  SpO2 100 % 08/18/19 1315  Vitals shown include unvalidated device data.  Last Pain:  Vitals:   08/18/19 0808  TempSrc: Oral  PainSc: 0-No pain      Patients Stated Pain Goal: 1 (57/26/20 3559)  Complications: No apparent anesthesia complications

## 2019-08-18 NOTE — Progress Notes (Addendum)
Progress Note  Patient Name: John Love Date of Encounter: 08/18/2019  Primary Cardiologist: [name], MD   Subjective   Feeling well today, planned to go to the OR for urology procedure.   Inpatient Medications    Scheduled Meds: . aspirin  81 mg Oral Daily  . atorvastatin  80 mg Oral q1800  . carvedilol  25 mg Oral BID WC  . Chlorhexidine Gluconate Cloth  6 each Topical Daily  . enoxaparin (LOVENOX) injection  40 mg Subcutaneous Daily  . insulin aspart  0-15 Units Subcutaneous TID AC & HS  . ramipril  2.5 mg Oral Daily  . sodium chloride flush  10-40 mL Intracatheter Q12H  . sodium chloride flush  3 mL Intravenous Q12H  . tamsulosin  0.4 mg Oral QPC supper  . ticagrelor  90 mg Oral BID   Continuous Infusions: . sodium chloride    . sodium chloride 75 mL/hr at 08/18/19 0300  . sodium chloride irrigation     PRN Meds: sodium chloride, acetaminophen, guaiFENesin-dextromethorphan, ipratropium-albuterol, loperamide, ondansetron (ZOFRAN) IV, simethicone, sodium chloride flush, sodium chloride flush   Vital Signs    Vitals:   08/17/19 2042 08/17/19 2045 08/18/19 0548 08/18/19 0808  BP: (!) 96/58  112/70 112/67  Pulse: (!) 48 78 77 77  Resp: 20  20 16   Temp: 98.1 F (36.7 C)  98.5 F (36.9 C) 98.1 F (36.7 C)  TempSrc: Oral  Oral Oral  SpO2: 99% 100% 100% 98%  Weight:   91 kg   Height:        Intake/Output Summary (Last 24 hours) at 08/18/2019 1024 Last data filed at 08/18/2019 0900 Gross per 24 hour  Intake 7994.06 ml  Output 12750 ml  Net -4755.94 ml   Last 3 Weights 08/18/2019 08/17/2019 08/16/2019  Weight (lbs) 200 lb 9.6 oz 205 lb 14.4 oz 204 lb 1.6 oz  Weight (kg) 90.992 kg 93.396 kg 92.579 kg      Telemetry    SR with continued bursts of atach ( no HB noted overnight) - Personally Reviewed  ECG    No new tracing this morning.  Physical Exam  Pleasant older AAM, sitting up in bed. GEN: No acute distress.   Neck: No JVD Cardiac: RRR, no  murmurs, rubs, or gallops.  Respiratory: Diffuse rhonchi GI: Soft, nontender, non-distended  MS: No edema; No deformity. Neuro:  Nonfocal  Psych: Normal affect   Labs    High Sensitivity Troponin:   Recent Labs  Lab 08/09/19 1807 08/09/19 2203 08/10/19 0820 08/10/19 1436  TROPONINIHS 75* 8,006* 5,166* 2,911*      Chemistry Recent Labs  Lab 08/15/19 0400 08/16/19 0532 08/18/19 0314  NA 133* 134* 137  K 3.7 3.8 3.9  CL 96* 100 101  CO2 24 24 25   GLUCOSE 110* 119* 103*  BUN 22 15 21   CREATININE 0.98 0.90 1.07  CALCIUM 8.7* 8.7* 8.8*  GFRNONAA >60 >60 >60  GFRAA >60 >60 >60  ANIONGAP 13 10 11      Hematology Recent Labs  Lab 08/15/19 0400 08/16/19 0532 08/18/19 0314  WBC 11.6* 12.5* 12.5*  RBC 4.83 4.65 4.41  HGB 12.0* 11.6* 11.0*  HCT 36.5* 34.9* 33.4*  MCV 75.6* 75.1* 75.7*  MCH 24.8* 24.9* 24.9*  MCHC 32.9 33.2 32.9  RDW 14.8 14.9 14.8  PLT 301 345 387    BNPNo results for input(s): BNP, PROBNP in the last 168 hours.   DDimer No results for input(s): DDIMER in the  last 168 hours.   Radiology    DG Chest 2 View  Result Date: 08/17/2019 CLINICAL DATA:  Preoperative evaluation, smoker, coronary artery disease EXAM: CHEST - 2 VIEW COMPARISON:  08/14/2019 FINDINGS: Upper normal size of cardiac silhouette. Mediastinal contours and pulmonary vascularity normal. RIGHT lower lobe atelectasis versus consolidation unchanged. Remaining lungs clear. No pleural effusion or pneumothorax. Mild levoconvex thoracic scoliosis. IMPRESSION: Persistent atelectasis versus consolidation of RIGHT lower lobe. Electronically Signed   By: Lavonia Dana M.D.   On: 08/17/2019 20:47   US PELVIS LIMITED (TRANSABDOMINAL ONLY)  Result Date: 08/17/2019 CLINICAL DATA:  Gross hematuria EXAM: LIMITED ULTRASOUND OF PELVIS TECHNIQUE: Limited transabdominal ultrasound examination of the pelvis was performed. COMPARISON:  Ultrasound dated 08/13/2019 FINDINGS: The Foley catheter bulb appears to be  well position within the urinary bladder. There is diffuse bladder wall thickening. There is a small amount of debris adjacent to the distal aspect of the Foley catheter. IMPRESSION: 1. Foley catheter appears to be position within the urinary bladder. 2. Persistent diffuse bladder wall thickening. 3. Complex debris about the distal aspect of the Foley catheter may represent a small amount of blood products. No large intraluminal blood clot identified on this ultrasound. Electronically Signed   By: Constance Holster M.D.   On: 08/17/2019 16:30    Cardiac Studies   Cath: 08/09/19   Prox RCA lesion is 80% stenosed.  Ost RCA to Prox RCA lesion is 50% stenosed.  1st Diag lesion is 95% stenosed.  Prox LAD to Mid LAD lesion is 75% stenosed.  3rd Mrg lesion is 100% stenosed.  Mid Cx to Dist Cx lesion is 80% stenosed.  A stent was successfully placed.  Post intervention, there is a 0% residual stenosis.  A stent was successfully placed.  Post intervention, there is a 0% residual stenosis.  A drug-eluting stent was successfully placed.  Post intervention, there is a 0% residual stenosis.  Post intervention, there is a 0% residual stenosis.  IMPRESSION:Successful mid to distal circumflex stenting which was the "culprit lesion in the setting of witnessed VF arrest post Covid vaccine administration. I wonder whether this was an anaphylactic reaction creating hypotension and acute coronary artery thrombosis in the setting of three-vessel disease. He has successful RCA intervention as well. He will need staged LAD diagonal branch bifurcation intervention using orbital atherectomy given the calcific nature of the disease assuming that he neurologically recovers. I spoken to PCCM and they are aware of the patient and will manage his vent. We will discuss whether he needs [place], systemic cooling.  Quay Burow. MD, [facility]  Diagnostic Dominance: Right  Intervention   TTE:  08/10/19  IMPRESSIONS    1. Left ventricular ejection fraction, by estimation, is 55 to 60%. The  left ventricle has normal function. The left ventricle demonstrates  regional wall motion abnormalities (see scoring diagram/findings for  description). There is mild concentric left  ventricular hypertrophy. Left ventricular diastolic parameters are  consistent with Grade I diastolic dysfunction (impaired relaxation).  2. Right ventricular systolic function is normal. The right ventricular  size is normal.  3. The mitral valve is grossly normal. No evidence of mitral valve  regurgitation. No evidence of mitral stenosis.  4. The aortic valve is grossly normal. Aortic valve regurgitation is not  visualized. No aortic stenosis is present.    Patient Profile     69 y.o. male withunknown PMH. [facility] with V.fibcardiac arrest during post COVID-19 vaccine observation.CPR was started,andreceivedshockand450mg  ofamio  before ROSC. He then transferred [facility] ED. He was intubated but awake on arrival. Of note, he vomited during king airway removal and intubation. His EKG on arrival showed some ST depression in V4,V5, V6 as well as ST elevation at AVR and he was sent to cath lab>showed 3 vessel disease.Successful mid to distal circumflexandRCA stenting performed to stabilize patoient and he will need staged LAD diagonal branch bifurcationlater (likely complicated intervention).  Assessment & Plan    1. Vfib arrest:OOH arrest that occurred after receiving COVID-19 vaccine, seizure like activity. ROSC obtained prior to hospital arrival. CPR started at scene and received 6 shocks along with amio 450. Total downtime was 20 minutes. Progressing well, working well with PT.  -- no driving for 6 months  2. NSTEMI:underwent emergent cardiac cath noted above with successful PCI/DES to RCA and Lcx. hsTn 8006. Placed on DAPT with ASA/Brilinta. Tolerating the addition of BB  and ACE. Echo with normal EF with posterior wall hypokinesis.  --on coreg 25mg  BID, ramipril 2.5mg  daily -- per Dr. Ellyn Hack will need outpatient staged PCI/to LAD-diag but will require arterectomy.   3. Atrial tachycardia/nocturnal bradycardia:Initially placed on coreg which was titrated to 25mg  BID. Then developed episodes of bradycardia into the 30s with missed beats. Was asymptomatic with these episodes, continued to have bursts of a-tach. Seen by EP with recommendations for outpatient sleep study and continuation of BB therapy.  -- will plan to arrange for outpatient sleep study at the time of discharge.  4. Hematuria:appreciate urology assistance. Has indwelling foley cath with CBI. H/H stable. -- started on tamsulosin -- urine has been clear for 2 days but CBI stopped and developed a clot noted in foley line. Now planned for for cystoscopy/slot evacuation with Dr. Junious Silk today.  -- Urine culture was + >100,000 E. Coli. Will defer to urology regarding management with possible antibiotics.  5. Aspiration pneumonitis: treated with 7 day course of unazyn/augmentin. WBC trending down. Has been weaned from  to RA.  -- continued use of IS  For questions or updates, please contact [place] Please consult [email or web address] for contact info under     Signed, Reino Bellis, NP  08/18/2019, 10:24 AM    Pt in OR for cystoscopy. I spoke with Dr Junious Silk at length yesterday about plans for cysto evaluation and alum instillation afterwards. He will likely need to remain hospitalized for this for at least a few more days. Appreciate urology care. Stable from cardiac perspective will continue current Rx.   Sherren Mocha 08/18/2019 11:37 AM

## 2019-08-19 LAB — URINE CULTURE: Culture: >=100000 — AB

## 2019-08-19 LAB — GLUCOSE, CAPILLARY
Glucose-Capillary: 138 mg/dL — ABNORMAL HIGH (ref 70–99)
Glucose-Capillary: 115 mg/dL — ABNORMAL HIGH (ref 70–99)

## 2019-08-19 MED ORDER — ATORVASTATIN CALCIUM 80 MG PO TABS: 80.0000 mg | ORAL_TABLET | Freq: Every day | ORAL | 3 refills | Status: DC

## 2019-08-19 MED ORDER — CEPHALEXIN 500 MG PO CAPS: 500.0000 mg | ORAL_CAPSULE | Freq: Three times a day (TID) | ORAL | 0 refills | Status: AC

## 2019-08-19 MED ORDER — CARVEDILOL 25 MG PO TABS: 25.0000 mg | ORAL_TABLET | Freq: Two times a day (BID) | ORAL | 3 refills | Status: DC

## 2019-08-19 MED ORDER — TAMSULOSIN HCL 0.4 MG PO CAPS: 0.4000 mg | ORAL_CAPSULE | Freq: Every day | ORAL | 6 refills | Status: DC

## 2019-08-19 MED ORDER — RAMIPRIL 2.5 MG PO CAPS: 2.5000 mg | ORAL_CAPSULE | Freq: Every day | ORAL | 3 refills | Status: DC

## 2019-08-19 MED ORDER — TICAGRELOR 90 MG PO TABS: 90.0000 mg | ORAL_TABLET | Freq: Two times a day (BID) | ORAL | 3 refills | Status: DC

## 2019-08-19 MED ORDER — CEPHALEXIN 250 MG PO CAPS: 500.0000 mg | ORAL_CAPSULE | Freq: Three times a day (TID) | ORAL | Status: DC

## 2019-08-19 MED FILL — BRILINTA 90 MG TABLET: 90 | 30 days supply | Qty: 60 | Fill #0

## 2019-08-19 MED FILL — TAMSULOSIN HCL 0.4 MG CAP: 0.4 | 30 days supply | Qty: 30 | Fill #0

## 2019-08-19 MED FILL — CARVEDILOL 25 MG TABLET: 25 | 30 days supply | Qty: 60 | Fill #0

## 2019-08-19 MED FILL — CEPHALEXIN 500 MG CAPS: 500 | 6 days supply | Qty: 18 | Fill #0

## 2019-08-19 MED FILL — RAMIPRIL 2.5 MG CAPS: 2.5 | 30 days supply | Qty: 30 | Fill #0

## 2019-08-19 MED FILL — ATORVASTATIN CALCIUM 80 MG: 80 | 30 days supply | Qty: 30 | Fill #0

## 2019-08-19 NOTE — Progress Notes (Signed)
Progress Note  Patient Name: John Love Date of Encounter: 08/19/2019  Primary Cardiologist: [name], MD   Subjective   Patient is doing well today.  He denies chest pain or shortness of breath.  He continues to have a cough.  Inpatient Medications    Scheduled Meds: . aspirin  81 mg Oral Daily  . atorvastatin  80 mg Oral q1800  . carvedilol  25 mg Oral BID WC  . Chlorhexidine Gluconate Cloth  6 each Topical Daily  . enoxaparin (LOVENOX) injection  40 mg Subcutaneous Daily  . insulin aspart  0-15 Units Subcutaneous TID AC & HS  . ramipril  2.5 mg Oral Daily  . sodium chloride flush  10-40 mL Intracatheter Q12H  . sodium chloride flush  3 mL Intravenous Q12H  . tamsulosin  0.4 mg Oral QPC supper  . ticagrelor  90 mg Oral BID   Continuous Infusions: . sodium chloride    . cefTRIAXone (ROCEPHIN)  IV    . lactated ringers 10 mL/hr at 08/18/19 1100   PRN Meds: sodium chloride, acetaminophen, guaiFENesin-dextromethorphan, ipratropium-albuterol, loperamide, ondansetron (ZOFRAN) IV, simethicone, sodium chloride flush, sodium chloride flush   Vital Signs    Vitals:   08/18/19 2020 08/19/19 0330 08/19/19 0628 08/19/19 0825  BP: 97/77 129/79  131/68  Pulse: 68 78  81  Resp: 20 20    Temp: 98.1 F (36.7 C) 98.2 F (36.8 C)    TempSrc: Oral Oral    SpO2: 98% 97%  99%  Weight:   93.1 kg   Height:        Intake/Output Summary (Last 24 hours) at 08/19/2019 0850 Last data filed at 08/19/2019 0600 Gross per 24 hour  Intake 15643.82 ml  Output 15225 ml  Net 418.82 ml   Last 3 Weights 08/19/2019 08/18/2019 08/18/2019  Weight (lbs) 205 lb 4.8 oz 200 lb 9.6 oz 200 lb 9.6 oz  Weight (kg) 93.123 kg 90.992 kg 90.992 kg      Telemetry    Sinus rhythm with multiple runs of SVT, nonsustained - Personally Reviewed   Physical Exam  Alert, oriented male in no distress GEN: No acute distress.   Neck: No JVD Cardiac: RRR, no murmurs, rubs, or gallops.  Respiratory:  Diffuse  rhonchi bilaterally GI: Soft, nontender, non-distended  MS: No edema; No deformity. Neuro:  Nonfocal  Psych: Normal affect   Labs    High Sensitivity Troponin:   Recent Labs  Lab 08/09/19 1807 08/09/19 2203 08/10/19 0820 08/10/19 1436  TROPONINIHS 75* 8,006* 5,166* 2,911*      Chemistry Recent Labs  Lab 08/15/19 0400 08/16/19 0532 08/18/19 0314  NA 133* 134* 137  K 3.7 3.8 3.9  CL 96* 100 101  CO2 24 24 25   GLUCOSE 110* 119* 103*  BUN 22 15 21   CREATININE 0.98 0.90 1.07  CALCIUM 8.7* 8.7* 8.8*  GFRNONAA >60 >60 >60  GFRAA >60 >60 >60  ANIONGAP 13 10 11      Hematology Recent Labs  Lab 08/15/19 0400 08/16/19 0532 08/18/19 0314  WBC 11.6* 12.5* 12.5*  RBC 4.83 4.65 4.41  HGB 12.0* 11.6* 11.0*  HCT 36.5* 34.9* 33.4*  MCV 75.6* 75.1* 75.7*  MCH 24.8* 24.9* 24.9*  MCHC 32.9 33.2 32.9  RDW 14.8 14.9 14.8  PLT 301 345 387    BNPNo results for input(s): BNP, PROBNP in the last 168 hours.   DDimer No results for input(s): DDIMER in the last 168 hours.   Radiology  DG Chest 2 View  Result Date: 08/17/2019 CLINICAL DATA:  Preoperative evaluation, smoker, coronary artery disease EXAM: CHEST - 2 VIEW COMPARISON:  08/14/2019 FINDINGS: Upper normal size of cardiac silhouette. Mediastinal contours and pulmonary vascularity normal. RIGHT lower lobe atelectasis versus consolidation unchanged. Remaining lungs clear. No pleural effusion or pneumothorax. Mild levoconvex thoracic scoliosis. IMPRESSION: Persistent atelectasis versus consolidation of RIGHT lower lobe. Electronically Signed   By: John Love M.D.   On: 08/17/2019 20:47   US PELVIS LIMITED (TRANSABDOMINAL ONLY)  Result Date: 08/17/2019 CLINICAL DATA:  Gross hematuria EXAM: LIMITED ULTRASOUND OF PELVIS TECHNIQUE: Limited transabdominal ultrasound examination of the pelvis was performed. COMPARISON:  Ultrasound dated 08/13/2019 FINDINGS: The Foley catheter bulb appears to be well position within the urinary  bladder. There is diffuse bladder wall thickening. There is a small amount of debris adjacent to the distal aspect of the Foley catheter. IMPRESSION: 1. Foley catheter appears to be position within the urinary bladder. 2. Persistent diffuse bladder wall thickening. 3. Complex debris about the distal aspect of the Foley catheter may represent a small amount of blood products. No large intraluminal blood clot identified on this ultrasound. Electronically Signed   By: John Love M.D.   On: 08/17/2019 16:30     Patient Profile     69 y.o. male withunknown PMH. [facility] with V.fibcardiac arrest during post COVID-19 vaccine observation.CPR was started,andreceivedshockand450mg  ofamio before ROSC. He then transferred to [facility] ED. He was intubated but awake on arrival. Of note, he vomited during king airway removal and intubation. His EKG on arrival showed some ST depression in V4,V5, V6 as well as ST elevation at AVR and he was sent to cath lab>showed 3 vessel disease.Successful mid to distal circumflexandRCA stenting performed to stabilize patoient and he will need staged LAD diagonal branch bifurcationlater (likely complicated intervention).  Assessment & Plan    1.  Ventricular fibrillation arrest: This occurred after receiving COVID-19 vaccine.  Resuscitated with CPR and 6 shocks for ventricular fibrillation, very good recovery and found to have multivessel CAD treated with PCI. 2.  Non-STEMI: Underwent PCI of the right coronary artery and left circumflex.  Residual severe stenosis in the LAD diagonal with planned atherectomy and bifurcation stenting after he recovers.  He will follow-up with Dr. Ellyn Hack as an outpatient for further discussion.  He continues on aspirin and ticagrelor. 3.  Atrial tachycardia: He continues on carvedilol at goal dose.  Reviewed his telemetry this morning with multiple short runs of SVT/atrial tachycardia.  Continue same therapy. 4.   Hematuria: This is kept the patient in the hospital.  He has undergone extensive evaluation and treatment by urology.  Foley catheter is removed this morning and if he voids he will be stable for discharge.  If he has problems the catheter will be reinserted and he can be discharged as long as there is no significant bleeding.  Appreciate urology care of this patient. 5.  Aspiration pneumonitis: The patient has been treated with appropriate antibiotics.  He continues to have a cough.  Disposition: Possible discharge later today depending on his voiding trial.  For questions or updates, please contact [place] Please consult [email or web address] for contact info under        Signed, Sherren Mocha, MD  08/19/2019, 8:50 AM

## 2019-08-19 NOTE — Progress Notes (Addendum)
1 Day Post-Op Subjective: Patient reports no complaints.   Objective: Vital signs in last 24 hours: Temp:  [97.2 F (36.2 C)-98.3 F (36.8 C)] 98.2 F (36.8 C) (03/05 0330) Pulse Rate:  [58-80] 78 (03/05 0330) Resp:  [16-24] 20 (03/05 0330) BP: (94-148)/(58-122) 129/79 (03/05 0330) SpO2:  [97 %-100 %] 97 % (03/05 0330) Weight:  [91 kg-93.1 kg] 93.1 kg (03/05 0628)  Intake/Output from previous day: 03/04 0701 - 03/05 0700 In: 15693.8 [P.O.:510; I.V.:2583.8; IV Piggyback:100] Out: 93818 [EXHBZ:16967; Blood:250] Intake/Output this shift: No intake/output data recorded.  Physical Exam:  Looks well, Alert and O in bed CBI off - urine clear yellow.   Lab Results: Recent Labs    08/18/19 0314  HGB 11.0*  HCT 33.4*   BMET Recent Labs    08/18/19 0314  NA 137  K 3.9  CL 101  CO2 25  GLUCOSE 103*  BUN 21  CREATININE 1.07  CALCIUM 8.8*   No results for input(s): LABPT, INR in the last 72 hours. No results for input(s): LABURIN in the last 72 hours. Results for orders placed or performed during the hospital encounter of 08/09/19  Respiratory Panel by RT PCR (Flu A&B, Covid) - Nasopharyngeal Swab     Status: None   Collection Time: 08/09/19  6:01 PM   Specimen: Nasopharyngeal Swab  Result Value Ref Range Status   SARS Coronavirus 2 by RT PCR NEGATIVE NEGATIVE Final    Comment: (NOTE) SARS-CoV-2 target nucleic acids are NOT DETECTED. The SARS-CoV-2 RNA is generally detectable in upper respiratoy specimens during the acute phase of infection. The lowest concentration of SARS-CoV-2 viral copies this assay can detect is 131 copies/mL. A negative result does not preclude SARS-Cov-2 infection and should not be used as the sole basis for treatment or other patient management decisions. A negative result may occur with  improper specimen collection/handling, submission of specimen other than nasopharyngeal swab, presence of viral mutation(s) within the areas targeted by  this assay, and inadequate number of viral copies (<131 copies/mL). A negative result must be combined with clinical observations, patient history, and epidemiological information. The expected result is Negative. Fact Sheet for Patients:  PinkCheek.be Fact Sheet for Healthcare Providers:  GravelBags.it This test is not yet ap proved or cleared by the [place] FDA and  has been authorized for detection and/or diagnosis of SARS-CoV-2 by FDA under an Emergency Use Authorization (EUA). This EUA will remain  in effect (meaning this test can be used) for the duration of the COVID-19 declaration under Section 564(b)(1) of the Act, 21 U.S.C. section 360bbb-3(b)(1), unless the authorization is terminated or revoked sooner.    Influenza A by PCR NEGATIVE NEGATIVE Final   Influenza B by PCR NEGATIVE NEGATIVE Final    Comment: (NOTE) The Xpert Xpress SARS-CoV-2/FLU/RSV assay is intended as an aid in  the diagnosis of influenza from Nasopharyngeal swab specimens and  should not be used as a sole basis for treatment. Nasal washings and  aspirates are unacceptable for Xpert Xpress SARS-CoV-2/FLU/RSV  testing. Fact Sheet for Patients: PinkCheek.be Fact Sheet for Healthcare Providers: GravelBags.it This test is not yet approved or cleared by the [place] FDA and  has been authorized for detection and/or diagnosis of SARS-CoV-2 by  FDA under an Emergency Use Authorization (EUA). This EUA will remain  in effect (meaning this test can be used) for the duration of the  Covid-19 declaration under Section 564(b)(1) of the Act, 21  U.S.C. section 360bbb-3(b)(1), unless the authorization  is  terminated or revoked. Performed at [facility], [place] [address] [place], [place] [address]   MRSA PCR Screening     Status: None   Collection Time: 08/09/19 10:23 PM   Specimen:  Nasopharyngeal  Result Value Ref Range Status   MRSA by PCR NEGATIVE NEGATIVE Final    Comment:        The GeneXpert MRSA Assay (FDA approved for NASAL specimens only), is one component of a comprehensive MRSA colonization surveillance program. It is not intended to diagnose MRSA infection nor to guide or monitor treatment for MRSA infections. Performed at [facility], [place] [address] [place], [place] [address]   Surgical pcr screen     Status: None   Collection Time: 08/17/19  1:03 AM   Specimen: Nasal Mucosa; Nasal Swab  Result Value Ref Range Status   MRSA, PCR NEGATIVE NEGATIVE Final   Staphylococcus aureus NEGATIVE NEGATIVE Final    Comment: (NOTE) The Xpert SA Assay (FDA approved for NASAL specimens in patients 83 years of age and older), is one component of a comprehensive surveillance program. It is not intended to diagnose infection nor to guide or monitor treatment. Performed at [facility], [place] [address], [place], [place] [address]   Culture, Urine     Status: Abnormal (Preliminary result)   Collection Time: 08/17/19  1:03 PM   Specimen: Urine, Catheterized  Result Value Ref Range Status   Specimen Description URINE, CATHETERIZED  Final   Special Requests NONE  Final   Culture (A)  Final    >=100,000 COLONIES/mL ESCHERICHIA COLI SUSCEPTIBILITIES TO FOLLOW Performed at [facility], [place] [address], [place],  [address]    Report Status PENDING  Incomplete    Studies/Results: DG Chest 2 View  Result Date: 08/17/2019 CLINICAL DATA:  Preoperative evaluation, smoker, coronary artery disease EXAM: CHEST - 2 VIEW COMPARISON:  08/14/2019 FINDINGS: Upper normal size of cardiac silhouette. Mediastinal contours and pulmonary vascularity normal. RIGHT lower lobe atelectasis versus consolidation unchanged. Remaining lungs clear. No pleural effusion or pneumothorax. Mild levoconvex thoracic scoliosis. IMPRESSION: Persistent atelectasis versus  consolidation of RIGHT lower lobe. Electronically Signed   By: Lavonia Dana M.D.   On: 08/17/2019 20:47   US PELVIS LIMITED (TRANSABDOMINAL ONLY)  Result Date: 08/17/2019 CLINICAL DATA:  Gross hematuria EXAM: LIMITED ULTRASOUND OF PELVIS TECHNIQUE: Limited transabdominal ultrasound examination of the pelvis was performed. COMPARISON:  Ultrasound dated 08/13/2019 FINDINGS: The Foley catheter bulb appears to be well position within the urinary bladder. There is diffuse bladder wall thickening. There is a small amount of debris adjacent to the distal aspect of the Foley catheter. IMPRESSION: 1. Foley catheter appears to be position within the urinary bladder. 2. Persistent diffuse bladder wall thickening. 3. Complex debris about the distal aspect of the Foley catheter may represent a small amount of blood products. No large intraluminal blood clot identified on this ultrasound. Electronically Signed   By: Constance Holster M.D.   On: 08/17/2019 16:30    Assessment/Plan:  hemorrhagic cystitis - resolved. Likely combination of chronic cystitis, bladder outlet obstruction and then blood thinners for CV issues. No worrisome pathology on cystoscopy or CT. Discontinue foley.   Chronic cystitis - urine cx with e coli sensitivities pending. D/c with po abx for 6 more days - cephalexin 500 TID or Bactrim DS BID are good options.   BPH - continue tamsulosin.   He needs to void prior to discharge. If he has  a painful inability to void/urinate, check a bladder scan and a coude catheter can be replaced if he is in retention. OK to discharge from GU pt of view even with a foley if urine clear. I can see him next week for void trial/foley removal if needed while he is still on abx.    LOS: 10 days   Festus Aloe 08/19/2019, 7:39 AM

## 2019-08-19 NOTE — Progress Notes (Signed)
Patient's urine clear. No clots noted. Clamped per MD order. Patient resting in bed. Will continue to monitor

## 2019-08-19 NOTE — TOC Transition Note (Signed)
Transition of Care [facility]) - CM/SW Discharge Note   Patient Details  Name: John Love MRN: [id number] Date of Birth: 1950-09-23  [facility] CM/SW Contact:  Zenon Mayo, RN Phone Number: 08/19/2019, 12:28 PM   Clinical Narrative:    Patient is going home  Today, patient is set up with [facility] for [facility], [place], NCM notified, Tiffany of patient dc today.     Final next level of care: [place] Barriers to Discharge: No Barriers Identified   Patient Goals and CMS Choice Patient states their goals for this hospitalization and ongoing recovery are:: get better CMS Medicare.gov Compare Post Acute Care list provided to:: Patient Choice offered to / list presented to : Patient  Discharge Placement                       Discharge Plan and Services                DME Arranged: (NA)           HH Agency: [facility] (formerly [employer]) Date [place]: 08/17/19 Time [place]: [phone] Representative spoke with at [place]: [place] ([place]) Interventions     Readmission Risk Interventions No flowsheet data found.

## 2019-08-19 NOTE — Discharge Summary (Addendum)
Discharge Summary    Patient ID: John Love MRN: [id number]; DOB: 09/09/1950  Admit date: 08/09/2019 Discharge date: 08/19/2019  Primary Care Provider: Patient, No Pcp Per  Primary Cardiologist: [name], MD  Primary Electrophysiologist:  None   Discharge Diagnoses    Principal Problem:   Cardiac arrest with ventricular fibrillation ([place]) -> 20 minutes CPR with ROSC Active Problems:   Sustained ventricular fibrillation ([place])   Cardiogenic shock (HCC)-borderline   Respiratory failure ([place])   AKI (acute kidney injury) ([place])   Multiple vessel coronary artery disease   CAD S/P percutaneous coronary angioplasty    Diagnostic Studies/Procedures    Left heart cath  08/09/19:  Prox RCA lesion is 80% stenosed.  Ost RCA to Prox RCA lesion is 50% stenosed.  1st Diag lesion is 95% stenosed.  Prox LAD to Mid LAD lesion is 75% stenosed.  3rd Mrg lesion is 100% stenosed.  Mid Cx to Dist Cx lesion is 80% stenosed.  A stent was successfully placed.  Post intervention, there is a 0% residual stenosis.  A stent was successfully placed.  Post intervention, there is a 0% residual stenosis.  A drug-eluting stent was successfully placed.  Post intervention, there is a 0% residual stenosis.  Post intervention, there is a 0% residual stenosis.  IMPRESSION: Successful mid to distal circumflex stenting which was the "culprit lesion in the setting of witnessed VF arrest post Covid vaccine administration.  I wonder whether this was an anaphylactic reaction creating hypotension and acute coronary artery thrombosis in the setting of three-vessel disease.  John has successful RCA intervention as well.  John will need staged LAD diagonal branch bifurcation intervention using orbital atherectomy given the calcific nature of the disease assuming that John neurologically recovers.  I spoken to PCCM and they are aware of the patient and will manage his vent.  We will discuss whether John needs  Arctic sun, systemic cooling.  Diagnostic Dominance: Right  Intervention     _____________   Echo 08/10/19: 1. Left ventricular ejection fraction, by estimation, is 55 to 60%. The  left ventricle has normal function. The left ventricle demonstrates  regional wall motion abnormalities (see scoring diagram/findings for  description). There is mild concentric left  ventricular hypertrophy. Left ventricular diastolic parameters are  consistent with Grade I diastolic dysfunction (impaired relaxation).  2. Right ventricular systolic function is normal. The right ventricular  size is normal.  3. The mitral valve is grossly normal. No evidence of mitral valve  regurgitation. No evidence of mitral stenosis.  4. The aortic valve is grossly normal. Aortic valve regurgitation is not  visualized. No aortic stenosis is present.    History of Present Illness     John Love is a 69 y.o. male who was admitted 08/09/19 following a Vfib arrest during the 15 min observation period after his second COVID-19 vaccine.   John Love is a 69 y.o. male who has unknown PMH.  [facility] with V.fib arrest.  John had received his 2nd COVID vaccine and during his post vaccine 15 minute observation period, John was noted to have seizure like activity before becoming unresponsive. CPR was started and upon AED placement, shock was advised for V.fib.  John required 6 shocks and 450 amio before ROSC.  Total downtime was 20 minutes.  John was started on an epi infusion and transported to ED.  In ED, it was initially felt that John did have purposeful movement.  King airway  was replaced with ETT and there was evidence of aspiration (patient vomited with king airway removal).    EKG with ST depression V4/5/6 and STE AVR. John was transported emergently to the cath lab.   In cath lab, John was found to have three vessel disease: prox RCA lesion 80% stenosed, Ost RCA to prox RCA lesion 50% stenosed, 1st  diag lesion 95% stenosed, prox LAD to mid LAD lesion 75% stenosed, mid Cx to dist Cx lesion 80% stenosed, third marginal was 100% stenosed. DES placed to mid-distal Cx (culprit lesion) and DES x 2 to RCA.    PCCM asked to admit to ICU.  Upon arrival to ICU, John is awake on vent despite propofol and is able to follow all basic commands.  Hospital Course     Consultants: urology, critical care, EP, AHF  NSTEMI HeartCath - PCI2/23/2021:  CULPRIT LESION: Mid Cx to Dist Cx lesion is 80% stenosed.3rd Mrg lesion is 100%stenosed at small branch take-off. ? DES PCI: 2.25 mm x 28 mm Synergy DES (proximal postdilated 2.75 mm. ? Post intervention, there is a 0% residual stenosis.Small side branch was occluded  Ost RCA to Prox RCA lesion is 50% stenosed.Prox RCA lesion is 80% stenosed. ? DES PCI: 3.5 mm x 24 mm Synergy DES(postdilated to 4.56m) ? Post intervention, there is a 0% residual stenosis.  Prox LAD to Mid LAD lesion is 75% stenosed.1st Diag lesion is 95% stenosed.[place] 1,1,1 - calcified bifurcationlesion  John tolerated the procedure well and was transported to ICU. John followed commands and was not cooled. John required pressors and AHF was consulted. Echocardiogram with normal EF of 50-55%, mild DD, no valvular disease.  John was extubated and weaned off pressors. Was able to start low dose coreg and lisinopril.    Suspect that this was unlikely to be an anaphylactic vaccination reaction. Higher suspicion for vagal response to the vaccine shot with ACS/plaque rupture in the setting of three vessel disease. John does report buying tums for the first time ever for what John thought was heartburn in the week leading up to his arrest.  Placed on ASA and brilinta. Other medications as below.   Plans for staged PCI to LAD-diagonal bifurcation once recovered from NSTEMI. Timing per Dr. HEllyn Hack   PAT, nocturnal bradycardia Brief runs of atrial tachycardia. BB titrated up with nocturnal  bradycardia and heart block. EP was asked to consult. Dr. ARayann Hemanreviewed telemetry with one brief (< 1 min) run of atrial flutter, possible his PATs may be conducting briefly at 2:1. Nocturnal heart block appeared to have PR prolongation (almost always P-P prolongation) that suggest a vagally mediated response. Suspect a component of OSA. No indication for PPM as John has never had syncope or dizziness. Dr. ARayann Hemandid not think John needed anticoagulation at this time.    Aspiration PNA Witnessed vomiting with removal of king airway with reintubation.  Completed ABX.    Hematuria Urology was consulted for hematuria. Foley in place with continuous irrigation. Underwent cystoscopy with clot evacuation and catheter change 08/18/19. Foley removed 08/19/19 for voiding trial. On flomax. Suspect traumatic foley insertion. Ecoli with sensitivity to keflex. John will finish ABX outpatient.  Patient could not void today in the urinal but voided in bed. OK per urology to discharge.   Snoring - recommend outpatient sleep study    Did the patient have an acute coronary syndrome (MI, NSTEMI, STEMI, etc) this admission?:  Yes  AHA/ACC Clinical Performance & Quality Measures: 1. Aspirin prescribed? - Yes 2. ADP Receptor Inhibitor (Plavix/Clopidogrel, Brilinta/Ticagrelor or Effient/Prasugrel) prescribed (includes medically managed patients)? - Yes 3. Beta Blocker prescribed? - Yes 4. High Intensity Statin (Lipitor 40-67m or Crestor 20-485m prescribed? - Yes 5. EF assessed during THIS hospitalization? - Yes 6. For EF <40%, was ACEI/ARB prescribed? - Yes 7. For EF <40%, Aldosterone Antagonist (Spironolactone or Eplerenone) prescribed? - Not Applicable (EF >/= 4075%8. Cardiac Rehab Phase II ordered (Included Medically managed Patients)? - Yes   _____________  Discharge Vitals Blood pressure 131/68, pulse 81, temperature 98.2 F (36.8 C), temperature source Oral, resp. rate 20,  height 6' (1.829 m), weight 93.1 kg, SpO2 99 %.  Filed Weights   08/18/19 0548 08/18/19 1057 08/19/19 0628  Weight: 91 kg 91 kg 93.1 kg    Labs & Radiologic Studies    CBC Recent Labs    08/18/19 0314  WBC 12.5*  HGB 11.0*  HCT 33.4*  MCV 75.7*  PLT 38916 Basic Metabolic Panel Recent Labs    08/18/19 0314  NA 137  K 3.9  CL 101  CO2 25  GLUCOSE 103*  BUN 21  CREATININE 1.07  CALCIUM 8.8*   Liver Function Tests No results for input(s): AST, ALT, ALKPHOS, BILITOT, PROT, ALBUMIN in the last 72 hours. No results for input(s): LIPASE, AMYLASE in the last 72 hours. High Sensitivity Troponin:   Recent Labs  Lab 08/09/19 1807 08/09/19 2203 08/10/19 0820 08/10/19 1436  TROPONINIHS 75* 8,006* 5,166* 2,911*    BNP Invalid input(s): POCBNP D-Dimer No results for input(s): DDIMER in the last 72 hours. Hemoglobin A1C No results for input(s): HGBA1C in the last 72 hours. Fasting Lipid Panel No results for input(s): CHOL, HDL, LDLCALC, TRIG, CHOLHDL, LDLDIRECT in the last 72 hours. Thyroid Function Tests No results for input(s): TSH, T4TOTAL, T3FREE, THYROIDAB in the last 72 hours.  Invalid input(s): FREET3 _____________  CT ABDOMEN PELVIS WO CONTRAST  Result Date: 08/11/2019 CLINICAL DATA:  Hematuria. Patient status post cardiac catheterization 08/09/2019. EXAM: CT ABDOMEN AND PELVIS WITHOUT CONTRAST TECHNIQUE: Multidetector CT imaging of the abdomen and pelvis was performed following the standard protocol without IV contrast. COMPARISON:  . single-view of the chest 08/10/2019 and 08/11/2019. FINDINGS: Lower chest: There is a small right pleural effusion and dense airspace disease in the posterior right lower lobe. Trace left effusion is also identified. Heart size is upper normal. No pericardial effusion. Hepatobiliary: There is contrast in the gallbladder from the patient's cardiac catheterization. A single punctate calcification in the posterior right hepatic lobe is  incidentally noted. The liver is otherwise unremarkable. Biliary tree appears normal. Pancreas: Unremarkable. No pancreatic ductal dilatation or surrounding inflammatory changes. Spleen: Normal in size without focal abnormality. Adrenals/Urinary Tract: The adrenal glands appear normal. The kidneys also appear normal without stone or hydronephrosis. No renal mass on infused examination. A Foley catheter is in place in the urinary bladder with some associated air in the bladder. The bladder is decompressed and its walls are severely thickened. The degree of thickening is out of proportion to decompression. Stomach/Bowel: Stomach is within normal limits. Appendix appears normal. No evidence of bowel wall thickening, distention, or inflammatory changes. A few scattered colonic diverticula are noted. Vascular/Lymphatic: Aortic atherosclerosis. No enlarged abdominal or pelvic lymph nodes. Reproductive: Prostatomegaly. Other: None. Musculoskeletal: No acute or focal abnormality. IMPRESSION: Marked thickening of the walls of the urinary bladder could be due to cystitis and/or bladder outlet obstruction in this  patient with prostatomegaly. No other explanation for hematuria is present. Negative for urinary tract stone. Small right pleural effusion with dense airspace disease in the posterior right lower lobe which could be due to atelectasis or pneumonia. Scattered diverticulosis without diverticulitis. Atherosclerosis. Electronically Signed   By: Inge Rise M.D.   On: 08/11/2019 11:24   DG Chest 2 View  Result Date: 08/17/2019 CLINICAL DATA:  Preoperative evaluation, smoker, coronary artery disease EXAM: CHEST - 2 VIEW COMPARISON:  08/14/2019 FINDINGS: Upper normal size of cardiac silhouette. Mediastinal contours and pulmonary vascularity normal. RIGHT lower lobe atelectasis versus consolidation unchanged. Remaining lungs clear. No pleural effusion or pneumothorax. Mild levoconvex thoracic scoliosis. IMPRESSION:  Persistent atelectasis versus consolidation of RIGHT lower lobe. Electronically Signed   By: Lavonia Dana M.D.   On: 08/17/2019 20:47   DG Chest 2 View  Result Date: 08/14/2019 CLINICAL DATA:  Shortness of breath EXAM: CHEST - 2 VIEW COMPARISON:  08/11/2019 FINDINGS: Cardiomegaly. Persistent atelectasis or consolidation of the medial right lung base. The visualized skeletal structures are unremarkable. IMPRESSION: 1. Persistent atelectasis or consolidation of the medial right lung base. No new airspace opacity. 2.  Cardiomegaly. Electronically Signed   By: Eddie Candle M.D.   On: 08/14/2019 12:51   US PELVIS LIMITED (TRANSABDOMINAL ONLY)  Result Date: 08/17/2019 CLINICAL DATA:  Gross hematuria EXAM: LIMITED ULTRASOUND OF PELVIS TECHNIQUE: Limited transabdominal ultrasound examination of the pelvis was performed. COMPARISON:  Ultrasound dated 08/13/2019 FINDINGS: The Foley catheter bulb appears to be well position within the urinary bladder. There is diffuse bladder wall thickening. There is a small amount of debris adjacent to the distal aspect of the Foley catheter. IMPRESSION: 1. Foley catheter appears to be position within the urinary bladder. 2. Persistent diffuse bladder wall thickening. 3. Complex debris about the distal aspect of the Foley catheter may represent a small amount of blood products. No large intraluminal blood clot identified on this ultrasound. Electronically Signed   By: Constance Holster M.D.   On: 08/17/2019 16:30   US PELVIS LIMITED (TRANSABDOMINAL ONLY)  Result Date: 08/13/2019 CLINICAL DATA:  Gross hematuria. EXAM: LIMITED ULTRASOUND OF PELVIS TECHNIQUE: Limited transabdominal ultrasound examination of the pelvis was performed. COMPARISON:  CT dated 08/13/2019 FINDINGS: The bladder is decompressed with a Foley catheter in place. Foley catheter bulb appears to be well position within the urinary bladder. The bladder wall is diffusely thickened. Prostate gland is mildly enlarged.  No clear intraluminal filling defects are noted within the urinary bladder that would be concerning for blood clots. IMPRESSION: 1. The urinary bladder is decompressed by Foley catheter. The Foley catheter bulb appears well position within the urinary bladder. 2. Diffuse bladder wall thickening is again noted. No obvious blood clots are noted within the urinary bladder. Electronically Signed   By: Constance Holster M.D.   On: 08/13/2019 18:12   CARDIAC CATHETERIZATION  Result Date: 08/09/2019  Prox RCA lesion is 80% stenosed.  Ost RCA to Prox RCA lesion is 50% stenosed.  1st Diag lesion is 95% stenosed.  Prox LAD to Mid LAD lesion is 75% stenosed.  3rd Mrg lesion is 100% stenosed.  Mid Cx to Dist Cx lesion is 80% stenosed.  A stent was successfully placed.  Post intervention, there is a 0% residual stenosis.  A stent was successfully placed.  Post intervention, there is a 0% residual stenosis.  A drug-eluting stent was successfully placed.  Post intervention, there is a 0% residual stenosis.  Post intervention, there is a 0%  residual stenosis.  John Love is a 69 y.o. male  [id number] LOCATION:  FACILITY: [facility] PHYSICIAN: Quay Burow, M.D. 03-22-1951 DATE OF PROCEDURE:  08/09/2019 DATE OF DISCHARGE: CARDIAC CATHETERIZATION / PCI DES LCX/RCA/ RHC History obtained from chart review.  Mr. Haro is a 69 year old moderately overweight African-American gentleman without prior cardiac history who apparently had his Covid vaccine shot early evening and had witnessed cardiac arrest after that with ventricular fibrillation.  John had CPR defibrillation and ROSC and approxi-20 to 25 minutes with spontaneous movement.  John was combative, was intubated and sedated.  His EKG showed lateral ST segment depression with slight ST segment elevation in lead aVR.  John was evaluated by Dr. Ellyn Hack in the ER who felt that John should be brought up for emergent cardiac catheterization.  Initially his blood pressure was in  the systolic 80 range and John was placed on IV Levophed. PROCEDURE DESCRIPTION: The patient was brought to the second floor [facility] cath lab in the postabsorptive state. John was premedicated with IV Versed, fentanyl and propofol.. His right groin was prepped and shaved in usual sterile fashion. Xylocaine 1% was used for local anesthesia. A 6 French sheath was inserted into the right common femoral artery using standard Seldinger technique.  An 8 French sheath was inserted into the right common femoral vein.  5 French right left second Silastic catheters on 5 French pigtail catheter used for selective coronary angiography, and left ventriculography using "a hand shot with obtaining left heart pressures.  Isovue dye is used for the entirety of the case.  Retrograde aorta, ventricular pullback pressures were recorded.  A VIP 7.5 French Swan-Ganz catheter was carefully maneuvered into the pulmonary artery obtaining sequential pressures. The circumflex was most likely the "culprit lesion" and this was initially addressed.  It was decided to leave the LAD diagonal branch bifurcation alone since there was TIMI-3 flow and this was a heavy calcified vessel and to proceed with RCA intervention should the circumflex go without complication.  The patient received Angiomax bolus followed by infusion with a ACT of greater than 300.  Because I was unable to give ticagrelor p.o. or via OG because of uncertainty of placement at bolus the patient with Cangrelor and placed him on a Cangrelor drip.  Isovue dye was used for the entirety of the intervention.  Retroaortic pressures monitored in the case. The circumflex was initially addressed using a 6 French XB 3.5 cm guide catheter.  A 0.14 P[place]ater guidewire was used to cross the distal circumflex obtuse marginal branch lesion which was predilated with a 2 mm x 12 mm balloon.  There was a 60 to 70% lesion upstream from this.  I then placed a 2.25 x 28 mm long Synergy  drug-eluting stent across both lesions and deployed at 16 atm.  I postdilated the proximal segment of the tube with a 3.5 mm x 24 mill meter long Synergy drug-eluting stent deploying at 16 atm.  I postdilated with a 4 mm x 15 mm noncompliant balloon but was unable to originally with a 2.7 0.5 x 12 mm long noncompliant balloon.  There was some spasm downstream which resolved with intracoronary glycerin and proximal postdilatation with excellent result. The patient was moderately hyper hypotensive during the procedure and received a 500 cc fluid bolus and went and had his Levophed infusion rate adjusted appropriately. I then addressed the RCA using a 6 [place] JR4 guide catheter, a 0.14 Prowater guidewire.  I direct stented this with a  3.5 mm x 24 mm long Synergy drug-eluting stent deployed at 16 atm.  I postdilated with a 4 mm x 15 mm balloon but was unable to pass the proximal struts and therefore used a second prowater for "buddy wire technique which allowed the post dilatation balloon to easily track into the stented segment post dilating the entire stent at 14 to 16 atm resulting reduction of an 80% proximal dominant RCA stenosis to 0% residual.  The patient tolerated procedure well.  John was sedated and ventilated throughout the case. The patient's LVEDP was 11 and his wedge was 15 suggesting that John was potentially mildly hypovolemic and did respond to a fluid bolus.   Successful mid to distal circumflex stenting which was the "culprit lesion in the setting of witnessed VF arrest post Covid vaccine administration.  I wonder whether this was an anaphylactic reaction creating hypotension and acute coronary artery thrombosis in the setting of three-vessel disease.  John has successful RCA intervention as well.  John will need staged LAD diagonal branch bifurcation intervention using orbital atherectomy given the calcific nature of the disease assuming that John neurologically recovers.  I spoken to PCCM and they are  aware of the patient and will manage his vent.  We will discuss whether John needs Arctic sun, systemic cooling. Quay Burow. MD, [facility] 08/09/2019 8:49 PM   DG CHEST PORT 1 VIEW  Result Date: 08/11/2019 CLINICAL DATA:  Congestive heart failure. Shortness of breath. EXAM: PORTABLE CHEST 1 VIEW COMPARISON:  08/10/2019 and 04/07/2020 FINDINGS: The endotracheal tube and Swan-Ganz catheter and NG tube have been removed. Heart size and pulmonary vascularity are normal. Bilateral pulmonary edema has resolved. There is persistent atelectasis or consolidation at the right lung base medially. Aortic atherosclerosis. No acute bone abnormality. IMPRESSION: Resolution of pulmonary edema. Persistent atelectasis or consolidation at the right lung base medially. Electronically Signed   By: Lorriane Shire M.D.   On: 08/11/2019 09:33   DG Chest Port 1 View  Result Date: 08/10/2019 CLINICAL DATA:  Respiratory failure. EXAM: PORTABLE CHEST 1 VIEW COMPARISON:  Chest x-ray 08/10/2019 FINDINGS: The endotracheal tube is 5.5 cm above the carina. The NG tube is coursing down the esophagus and into the stomach. The Swan-Ganz catheter tip is in the right pulmonary artery. The cardiac silhouette, mediastinal and hilar contours are stable. Persistent asymmetric interstitial and airspace process in the lungs but improved aeration since the prior study. No definite pleural effusions or pneumothorax. IMPRESSION: 1. Stable support apparatus. 2. Improving lung aeration but persistent asymmetric interstitial and airspace process. Electronically Signed   By: Marijo Sanes M.D.   On: 08/10/2019 09:46   Portable Chest xray  Result Date: 08/10/2019 CLINICAL DATA:  Status post catheterization, intubated EXAM: PORTABLE CHEST 1 VIEW COMPARISON:  08/09/2019 FINDINGS: Two frontal views of the chest demonstrates endotracheal tube overlying tracheal air column tip at level of thoracic inlet. Enteric catheter coils over the gastric antrum. Central  venous catheter from an inferior approach, wedged within the periphery of the right pulmonary artery. There is central vascular congestion. Consolidation at the right lung base with associated right-sided volume loss compatible with atelectasis. There may be a small right pleural effusion. No pneumothorax. IMPRESSION: 1. Right-sided volume loss with right basilar consolidation, favor atelectasis. 2. Central vascular congestion. 3. Support devices as above, with flow directed central venous catheter wedged within the periphery of the right pulmonary artery. Electronically Signed   By: Randa Ngo M.D.   On: 08/10/2019 00:50  DG Chest Port 1 View  Result Date: 08/09/2019 CLINICAL DATA:  68 year old male status post CPR. EXAM: PORTABLE CHEST 1 VIEW COMPARISON:  None. FINDINGS: Endotracheal tube with tip approximately 6 cm above the carina. Enteric tube extends below the diaphragm with tip beyond the inferior margin of the image. There is cardiomegaly with vascular congestion. No consolidative changes. There is no pleural effusion or pneumothorax. No acute osseous pathology. IMPRESSION: 1. Endotracheal tube above the carina. 2. Cardiomegaly with vascular congestion. Electronically Signed   By: Anner Crete M.D.   On: 08/09/2019 18:53   ECHOCARDIOGRAM COMPLETE  Result Date: 08/10/2019    ECHOCARDIOGRAM REPORT   Patient Name:   John Love Date of Exam: 08/10/2019 Medical Rec #:  [id number]       Height:       72.0 in Accession #:    [id number]      Weight:       224.9 lb Date of Birth:  1950-12-31      BSA:          2.239 m Patient Age:    30 years        BP:           142/72 mmHg Patient Gender: M               HR:           90 bpm. Exam Location:  Inpatient Procedure: 2D Echo, Cardiac Doppler and Color Doppler Indications:    I49.9* Cardiac arrhythmia, unspecified. Cardiac arrest.  History:        Patient has no prior history of Echocardiogram examinations.                 Arrythmias:Cardiac Arrest  and Ventricular Fibrillation. Cardiac                 shock. Respiratory failure.  Sonographer:    Roseanna Rainbow RDCS Referring Phys: [id number] [facility] P [facility]  Sonographer Comments: Technically difficult study due to poor echo windows, no parasternal window, suboptimal parasternal window, suboptimal apical window and echo performed with patient supine and on artificial respirator. IMPRESSIONS  1. Left ventricular ejection fraction, by estimation, is 55 to 60%. The left ventricle has normal function. The left ventricle demonstrates regional wall motion abnormalities (see scoring diagram/findings for description). There is mild concentric left ventricular hypertrophy. Left ventricular diastolic parameters are consistent with Grade I diastolic dysfunction (impaired relaxation).  2. Right ventricular systolic function is normal. The right ventricular size is normal.  3. The mitral valve is grossly normal. No evidence of mitral valve regurgitation. No evidence of mitral stenosis.  4. The aortic valve is grossly normal. Aortic valve regurgitation is not visualized. No aortic stenosis is present. Comparison(s): No prior Echocardiogram. FINDINGS  Left Ventricle: Left ventricular ejection fraction, by estimation, is 55 to 60%. The left ventricle has normal function. The left ventricle demonstrates regional wall motion abnormalities. The left ventricular internal cavity size was normal in size. There is mild concentric left ventricular hypertrophy. Left ventricular diastolic parameters are consistent with Grade I diastolic dysfunction (impaired relaxation). Normal left ventricular filling pressure.  LV Wall Scoring: The posterior wall is hypokinetic. Right Ventricle: The right ventricular size is normal. No increase in right ventricular wall thickness. Right ventricular systolic function is normal. Left Atrium: Left atrial size was normal in size. Right Atrium: Right atrial size was normal in size. Pericardium: There is no evidence  of pericardial effusion. Mitral Valve: The mitral valve  is grossly normal. No evidence of mitral valve regurgitation. No evidence of mitral valve stenosis. Tricuspid Valve: The tricuspid valve is grossly normal. Tricuspid valve regurgitation is trivial. Aortic Valve: The aortic valve is grossly normal. Aortic valve regurgitation is not visualized. No aortic stenosis is present. Pulmonic Valve: The pulmonic valve was grossly normal. Pulmonic valve regurgitation is not visualized. Aorta: The aortic root is normal in size and structure. IAS/Shunts: No atrial level shunt detected by color flow Doppler. Additional Comments: A venous catheter is visualized in the right atrium and right ventricle.  LEFT VENTRICLE PLAX 2D LVIDd:         4.15 cm     Diastology LVIDs:         3.00 cm     LV e' lateral:   6.20 cm/s LV PW:         1.50 cm     LV E/e' lateral: 11.0 LV IVS:        1.45 cm     LV e' medial:    4.57 cm/s LVOT diam:     2.40 cm     LV E/e' medial:  14.9 LV SV:         59 LV SV Index:   26 LVOT Area:     4.52 cm  LV Volumes (MOD) LV vol d, MOD A2C: 47.8 ml LV vol d, MOD A4C: 65.0 ml LV vol s, MOD A2C: 25.2 ml LV vol s, MOD A4C: 28.9 ml LV SV MOD A2C:     22.6 ml LV SV MOD A4C:     65.0 ml LV SV MOD BP:      29.3 ml RIGHT VENTRICLE             IVC RV S prime:     14.00 cm/s  IVC diam: 1.50 cm TAPSE (M-mode): 1.2 cm LEFT ATRIUM           Index LA diam:      2.60 cm 1.16 cm/m LA Vol (A2C): 21.9 ml 9.78 ml/m LA Vol (A4C): 39.3 ml 17.55 ml/m  AORTIC VALVE LVOT Vmax:   93.80 cm/s LVOT Vmean:  59.600 cm/s LVOT VTI:    0.131 m  AORTA Ao Root diam: 2.70 cm MITRAL VALVE MV Area (PHT): 2.66 cm    SHUNTS MV Decel Time: 285 msec    Systemic VTI:  0.13 m MV E velocity: 67.90 cm/s  Systemic Diam: 2.40 cm MV A velocity: 75.20 cm/s MV E/A ratio:  0.90 Eleonore Chiquito MD Electronically signed by Eleonore Chiquito MD Signature Date/Time: 08/10/2019/1:33:50 PM    Final    Disposition   Pt is being discharged home today in good  condition.  Follow-up Plans & Appointments    Follow-up Information    [facility] Follow up.   Specialty: Home Health Services Why: [facility], HHPT Contact information: [address] [place] [address] [phone]        Benito Mccreedy, MD Follow up on 08/22/2019.   Specialty: Internal Medicine Why:  3 pm hospital follow and new patient establisment  Contact information: [address] [place] [address] [phone]        Call Festus Aloe, MD.   Specialty: Urology Contact information: [place] [address] [phone]        Lendon Colonel, NP Follow up on 08/23/2019.   Specialties: Nurse Practitioner, Radiology, Cardiology Why: 11:15 Contact information: [place]  [place] [address] [phone]        Leonie Man, MD Follow up on 09/19/2019.   Specialty: Cardiology Why: 11:40 - discuss PCI Contact information: [address] [place] [address] [phone]          Discharge Instructions    Amb Referral to Cardiac Rehabilitation   Complete by: As directed    Referring to [place] [facility] 2   Diagnosis: Coronary Stents   After initial evaluation and assessments completed: Virtual Based Care may be provided alone or in conjunction with Phase 2 Cardiac Rehab based on patient barriers.: Yes   Diet - low sodium heart healthy   Complete by: As directed    Diet - low sodium heart healthy   Complete by: As directed    Discharge instructions   Complete by: As directed    No driving for 2 days. No lifting over 5 lbs for 1 week. No sexual activity for 1 week. You may return to work in one week. Keep procedure site clean & dry. If you notice increased pain, swelling, bleeding or pus, call/return!  You may shower, but no soaking baths/hot tubs/pools for 1 week.   Increase activity slowly   Complete by: As directed    Increase activity slowly   Complete by: As  directed       Discharge Medications   Allergies as of 08/19/2019   No Known Allergies     Medication List    TAKE these medications   aspirin EC 81 MG tablet Take 81 mg by mouth daily.   atorvastatin 80 MG tablet Commonly known as: LIPITOR Take 1 tablet (80 mg total) by mouth daily at 6 PM.   carvedilol 25 MG tablet Commonly known as: COREG Take 1 tablet (25 mg total) by mouth 2 (two) times daily with a meal.   cephALEXin 500 MG capsule Commonly known as: KEFLEX Take 1 capsule (500 mg total) by mouth every 8 (eight) hours for 6 days. Start taking on: August 20, 2019   MULTIVITAMIN ADULT PO Take 2 tablets by mouth daily.   ramipril 2.5 MG capsule Commonly known as: ALTACE Take 1 capsule (2.5 mg total) by mouth daily. Start taking on: August 20, 2019   tamsulosin 0.4 MG Caps capsule Commonly known as: FLOMAX Take 1 capsule (0.4 mg total) by mouth daily after supper.   ticagrelor 90 MG Tabs tablet Commonly known as: BRILINTA Take 1 tablet (90 mg total) by mouth 2 (two) times daily.          Outstanding Labs/Studies     Duration of Discharge Encounter   Greater than 30 minutes including physician time.  Signed, Tami Lin Shriyans Kuenzi, PA 08/19/2019, 1:52 PM

## 2019-08-19 NOTE — Progress Notes (Signed)
Physical Therapy Treatment Patient Details Name: John Love MRN: [id number] DOB: 1950-08-04 Today's Date: 08/19/2019    History of Present Illness John Love is a 69 y.o. male who was admitted 2/23 after V.fib arrest following his 2nd COVID vaccine. No PMH on file    PT Comments    Patient progressing well towards PT goals. Improved ambulation distance without use of DME with Min guard progressing to supervision for safety. HR up to 143 bpm, irregular rhythm. Sp02 >94% on RA. Pt reports feeling good about being able to move a little bit more without so many lines. Encouraged increasing activity while in the hospital. Still reports not being able to urinate. Discharge recommendation updated to outpatient cardiac rehab based on improvement. Will follow.   Follow Up Recommendations  Outpatient PT;Supervision for mobility/OOB(cardiac rehab)     Equipment Recommendations  None recommended by PT    Recommendations for Other Services       Precautions / Restrictions Precautions Precautions: Fall Restrictions Weight Bearing Restrictions: No    Mobility  Bed Mobility Overal bed mobility: Modified Independent Bed Mobility: Supine to Sit     Supine to sit: Modified independent (Device/Increase time);HOB elevated     General bed mobility comments: HOB elevated, increased time  Transfers Overall transfer level: Needs assistance Equipment used: None Transfers: Sit to/from Stand Sit to Stand: Supervision         General transfer comment: Supervision for safety. Stood from [employer].  Ambulation/Gait Ambulation/Gait assistance: Supervision;Min guard Gait Distance (Feet): 250 Feet Assistive device: None Gait Pattern/deviations: Step-through pattern;Decreased stride length;Narrow base of support Gait velocity: 2.13 ft/sec. Gait velocity interpretation: 1.31 - 2.62 ft/sec, indicative of limited community ambulator General Gait Details: Slow, guarded and mostly steady gait.  No DME needed. Decreased step lengths bilaterally. HR up to 143 bpm.   Stairs             Wheelchair Mobility    Modified Rankin (Stroke Patients Only)       Balance Overall balance assessment: Mild deficits observed, not formally tested                                          Cognition Arousal/Alertness: Awake/alert Behavior During Therapy: WFL for tasks assessed/performed Overall Cognitive Status: Within Functional Limits for tasks assessed                                        Exercises      General Comments        Pertinent Vitals/Pain Pain Assessment: Faces Faces Pain Scale: Hurts a little bit Pain Location: chest from rib/sternal pain Pain Descriptors / Indicators: Aching;Sore Pain Intervention(s): Monitored during session    Home Living                      Prior Function            PT Goals (current goals can now be found in the care plan section) Progress towards PT goals: Progressing toward goals    Frequency    Min 3X/week      PT Plan Discharge plan needs to be updated    Co-evaluation              AM-PAC PT "6 Clicks" Mobility  Outcome Measure  Help needed turning from your back to your side while in a flat bed without using bedrails?: None Help needed moving from lying on your back to sitting on the side of a flat bed without using bedrails?: None Help needed moving to and from a bed to a chair (including a wheelchair)?: None Help needed standing up from a chair using your arms (e.g., wheelchair or bedside chair)?: None Help needed to walk in hospital room?: A Little Help needed climbing 3-5 steps with a railing? : A Little 6 Click Score: 22    End of Session Equipment Utilized During Treatment: Gait belt Activity Tolerance: Patient tolerated treatment well Patient left: in bed;with call bell/phone within reach(left sitting EOB with cardiac rehab in room) Nurse  Communication: Mobility status PT Visit Diagnosis: Unsteadiness on feet (R26.81)     Time: 0944-1000 PT Time Calculation (min) (ACUTE ONLY): 16 min  Charges:  $Therapeutic Exercise: 8-22 mins                     Marisa Severin, PT, DPT Acute Rehabilitation Services Pager [phone] Office [phone]       [name] 08/19/2019, 1:10 PM

## 2019-08-19 NOTE — Progress Notes (Signed)
Encouraged pt to void into urinal few hours after foley is removed. There was very little amount of urine output in the urinal. But, he wet his bed.  Bladder scan show 186. Notified Urology DR Junious Silk.

## 2019-08-19 NOTE — Progress Notes (Signed)
CARDIAC REHAB PHASE I   Went to offer to walk with pt, pt just returned from walking with PT. Pt denies pain or SOB with walking. Pt states walking improved since getting the foley out. Reinforced d/c education. Pt anxious for d/c, provided support and encouragement to pt. Pt referred to CRP II [place] with knowledge he needs a staged PCI.  [id number] Rufina Falco, RN BSN 08/19/2019 10:19 AM

## 2019-08-19 NOTE — Discharge Instructions (Signed)
Benign Prostatic Hyperplasia  Benign prostatic hyperplasia (BPH) is an enlarged prostate gland that is caused by the normal aging process and not by cancer. The prostate is a walnut-sized gland that is involved in the production of semen. It is located in front of the rectum and below the bladder. The bladder stores urine and the urethra is the tube that carries the urine out of the body. The prostate may get bigger as a man gets older. An enlarged prostate can press on the urethra. This can make it harder to pass urine. The build-up of urine in the bladder can cause infection. Back pressure and infection may progress to bladder damage and kidney (renal) failure. What are the causes? This condition is part of a normal aging process. However, not all men develop problems from this condition. If the prostate enlarges away from the urethra, urine flow will not be blocked. If it enlarges toward the urethra and compresses it, there will be problems passing urine. What increases the risk? This condition is more likely to develop in men over the age of 67 years. What are the signs or symptoms? Symptoms of this condition include:  Getting up often during the night to urinate.  Needing to urinate frequently during the day.  Difficulty starting urine flow.  Decrease in size and strength of your urine stream.  Leaking (dribbling) after urinating.  Inability to pass urine. This needs immediate treatment.  Inability to completely empty your bladder.  Pain when you pass urine. This is more common if there is also an infection.  Urinary tract infection (UTI). How is this diagnosed? This condition is diagnosed based on your medical history, a physical exam, and your symptoms. Tests will also be done, such as:  A post-void bladder scan. This measures any amount of urine that may remain in your bladder after you finish urinating.  A digital rectal exam. In a rectal exam, your health care provider  checks your prostate by putting a lubricated, gloved finger into your rectum to feel the back of your prostate gland. This exam detects the size of your gland and any abnormal lumps or growths.  An exam of your urine (urinalysis).  A prostate specific antigen (PSA) screening. This is a blood test used to screen for prostate cancer.  An ultrasound. This test uses sound waves to electronically produce a picture of your prostate gland. Your health care provider may refer you to a specialist in kidney and prostate diseases (urologist). How is this treated? Once symptoms begin, your health care provider will monitor your condition (active surveillance or watchful waiting). Treatment for this condition will depend on the severity of your condition. Treatment may include:  Observation and yearly exams. This may be the only treatment needed if your condition and symptoms are mild.  Medicines to relieve your symptoms, including: ? Medicines to shrink the prostate. ? Medicines to relax the muscle of the prostate.  Surgery in severe cases. Surgery may include: ? Prostatectomy. In this procedure, the prostate tissue is removed completely through an open incision or with a laparoscope or robotics. ? Transurethral resection of the prostate (TURP). In this procedure, a tool is inserted through the opening at the tip of the penis (urethra). It is used to cut away tissue of the inner core of the prostate. The pieces are removed through the same opening of the penis. This removes the blockage. ? Transurethral incision (TUIP). In this procedure, small cuts are made in the prostate. This lessens  the prostate's pressure on the urethra. ? Transurethral microwave thermotherapy (TUMT). This procedure uses microwaves to create heat. The heat destroys and removes a small amount of prostate tissue. ? Transurethral needle ablation (TUNA). This procedure uses radio frequencies to destroy and remove a small amount of  prostate tissue. ? Interstitial laser coagulation ([place]). This procedure uses a laser to destroy and remove a small amount of prostate tissue. ? Transurethral electrovaporization (TUVP). This procedure uses electrodes to destroy and remove a small amount of prostate tissue. ? Prostatic urethral lift. This procedure inserts an implant to push the lobes of the prostate away from the urethra. Follow these instructions at home:  Take over-the-counter and prescription medicines only as told by your health care provider.  Monitor your symptoms for any changes. Contact your health care provider with any changes.  Avoid drinking large amounts of liquid before going to bed or out in public.  Avoid or reduce how much caffeine or alcohol you drink.  Give yourself time when you urinate.  Keep all follow-up visits as told by your health care provider. This is important. Contact a health care provider if:  You have unexplained back pain.  Your symptoms do not get better with treatment.  You develop side effects from the medicine you are taking.  Your urine becomes very dark or has a bad smell.  Your lower abdomen becomes distended and you have trouble passing your urine. Get help right away if:  You have a fever or chills.  You suddenly cannot urinate.  You feel lightheaded, or very dizzy, or you faint.  There are large amounts of blood or clots in the urine.  Your urinary problems become hard to manage.  You develop moderate to severe low back or flank pain. The flank is the side of your body between the ribs and the hip. These symptoms may represent a serious problem that is an emergency. Do not wait to see if the symptoms will go away. Get medical help right away. Call your local emergency services (911 in the [place] Do not drive yourself to the hospital. Summary  Benign prostatic hyperplasia (BPH) is an enlarged prostate that is caused by the normal aging process and not by  cancer.  An enlarged prostate can press on the urethra. This can make it hard to pass urine.  This condition is part of a normal aging process and is more likely to develop in men over the age of 61 years.  Get help right away if you suddenly cannot urinate. This information is not intended to replace advice given to you by your health care provider. Make sure you discuss any questions you have with your health care provider. Document Revised: 04/27/2018 Document Reviewed: 07/07/2016 Elsevier Patient Education  2020 [employer] about your medication: Brilinta (anti-platelet agent)  Generic Name (Brand): ticagrelor (Brilinta), twice daily medication  PURPOSE: You are taking this medication along with aspirin to lower your chance of having a heart attack, stroke, or blood clots in your heart stent. These can be fatal. Brilinta and aspirin help prevent platelets from sticking together and forming a clot that can block an artery or your stent.   Common SIDE EFFECTS you may experience include: bruising or bleeding more easily, shortness of breath  Do not stop taking BRILINTA without talking to the doctor who prescribes it for you. People who are treated with a stent and stop taking Brilinta too soon, have a higher risk of getting a  blood clot in the stent, having a heart attack, or dying. If you stop Brilinta because of bleeding, or for other reasons, your risk of a heart attack or stroke may increase.   Tell all of your doctors and dentists that you are taking Brilinta. They should talk to the doctor who prescribed Brilinta for you before you have any surgery or invasive procedure.   Contact your health care provider if you experience: severe or uncontrollable bleeding, pink/red/brown urine, vomiting blood or vomit that looks like "coffee grounds", red or black stools (looks like tar), coughing up blood or blood  clots ----------------------------------------------------------------------------------------------------------------------  New Medications: Aspirin and ticagrelor (Brilinta) - to keep stent open Ramipril - works on blood pressure  Carvedilol (Coreg) - works on blood pressure and heart rate Atorvastatin (Lipitor) - works on reducing cholesterol

## 2019-08-22 DIAGNOSIS — Z1389 Encounter for screening for other disorder: Secondary | ICD-10-CM | POA: Diagnosis not present

## 2019-08-22 DIAGNOSIS — E7800 Pure hypercholesterolemia, unspecified: Secondary | ICD-10-CM | POA: Diagnosis not present

## 2019-08-22 DIAGNOSIS — Z0001 Encounter for general adult medical examination with abnormal findings: Secondary | ICD-10-CM | POA: Diagnosis not present

## 2019-08-22 DIAGNOSIS — Z136 Encounter for screening for cardiovascular disorders: Secondary | ICD-10-CM | POA: Diagnosis not present

## 2019-08-22 DIAGNOSIS — Z5181 Encounter for therapeutic drug level monitoring: Secondary | ICD-10-CM | POA: Diagnosis not present

## 2019-08-22 DIAGNOSIS — Z01118 Encounter for examination of ears and hearing with other abnormal findings: Secondary | ICD-10-CM | POA: Diagnosis not present

## 2019-08-22 DIAGNOSIS — N401 Enlarged prostate with lower urinary tract symptoms: Secondary | ICD-10-CM | POA: Diagnosis not present

## 2019-08-22 DIAGNOSIS — I2510 Atherosclerotic heart disease of native coronary artery without angina pectoris: Secondary | ICD-10-CM | POA: Diagnosis not present

## 2019-08-22 DIAGNOSIS — Z131 Encounter for screening for diabetes mellitus: Secondary | ICD-10-CM | POA: Diagnosis not present

## 2019-08-22 DIAGNOSIS — I10 Essential (primary) hypertension: Secondary | ICD-10-CM | POA: Diagnosis not present

## 2019-08-22 NOTE — Progress Notes (Signed)
From: John Man, MD  Cardiology Office Note   Date:  08/23/2019   ID:  John Love 12-03-1950, MRN [id number]  PCP:  Patient, No Pcp Per  Cardiologist:  Dr. Ellyn Hack  CC: Hospital follow up s/p cardiac arrest.   History of Present Illness: John Love is a 69 y.o. male who presents for ongoing assessment and management of CAD.  He has extensive CAD per recent cardiac cath.  Per cardiac cath he had 80% lesion of the proximal RCA 80% stenosed, ostial to proximal RCA lesion 50% stenosed, 1st diagonal lesion 95% stenosed, proximal LAD to Mid LAD 75% stenosed, mid Cx to Distal Cx lesion 80% stenosed.  A stent was successfully placed to the RCA. He is to have staged LAD diagonal branch bifurcation intervention using orbital atherectomy, given the calcific nature of the disease.    He initially presented to [facility] ED on 08/09/2019 after receiving second COVID vaccine. During his post vaccine 15 minute waiting period, he was noted to have a what appeared to be a seizure before becoming unresponsive. She was given CPR with AED shock for V-fib and was given amiodarone IV. He was intubated and transferred emergently to the cath lab. Results are discussed above.   It was suspected that his MI was not related to anaphylactic vaccine reaction and instead a vagal response to the vaccine with ACS and plaque rupture in the setting of 3 vessel disease. He has been having heartburn symptoms and using Tums a week leading up to the cardiac arrest.   He was also noted to have brief runs of atrial tachycardia, with nocturnal bradycardia and heart block on BB. He was seen by Dr. Rayann Heman who felt this was a vagally mediated response. Nocturnal heart block appeared to have PR prolongation (almost always P-P prolongation that suggest a vagally mediated response). It was also suspected that he had a component of OSA.  He was not recommended for anticoagulation.   He was treated for aspiration pneumonia. He was  also found to have hematuria and was seen by nephrology with foley catheter insertion. He was treated for UTI.   He was discharged on carvedilol, ASA, atorvastatin, ramipril, ticagrelor.   He is here today on follow up to evaluate his status and to discuss staged intervention to the LAD. Dr. Ellyn Hack has been involved in the planning of this visit and conversation with this patient.   He comes today feeling sore in his chest and having chest congestion. He continues to have some productive coughing on antibiotic therapy.  He denies frank chest pain. He has some dyspnea on exertion from his pneumonia.    Past Medical History:  Diagnosis Date  . Cardiac arrest with ventricular fibrillation (HCC) -> 20 minutes CPR with ROSC 08/09/2019  . Multiple vessel coronary artery disease 08/10/2019   Heart Cath -PCI 08/09/2019:  CULPRIT LESION: Mid Cx to Dist Cx lesion is 80% stenosed. 3rd Mrg lesion is 100% stenosed at small branch take-off. DES PCI: 2.25 mm x 28 mm Synergy DES (proximal postdilated 2.75 mm.  Post intervention, there is a 0% residual stenosis.  Small side branch was occluded Ost RCA to Prox RCA lesion is 50% stenosed.Prox RCA lesion is 80% stenosed.  DES PCI: 3.5 mm x 24 mm Sy    Past Surgical History:  Procedure Laterality Date  . CORONARY STENT INTERVENTION N/A 08/09/2019   Procedure: CORONARY STENT INTERVENTION;  Surgeon: Lorretta Harp, MD;  Location: [place] [facility];  Service: Cardiovascular;  Laterality: N/A;  . CYSTOSCOPY N/A 08/18/2019   Procedure: CYSTOSCOPY CLOT EVACUATION FULGURATION 0.5-2 CM.;  Surgeon: Festus Aloe, MD;  Location: [place];  Service: Urology;  Laterality: N/A;  . LEFT HEART CATH AND CORONARY ANGIOGRAPHY N/A 08/09/2019   Procedure: LEFT HEART CATH AND CORONARY ANGIOGRAPHY;  Surgeon: Lorretta Harp, MD;  Location: [place] [facility];  Service: Cardiovascular;  Laterality: N/A;     Current Outpatient Medications  Medication Sig Dispense Refill  . aspirin  EC 81 MG tablet Take 81 mg by mouth daily.    [name] atorvastatin (LIPITOR) 80 MG tablet Take 1 tablet (80 mg total) by mouth daily at 6 PM. 90 tablet 3  . carvedilol (COREG) 25 MG tablet Take 1 tablet (25 mg total) by mouth 2 (two) times daily with a meal. 180 tablet 3  . cephALEXin (KEFLEX) 500 MG capsule Take 1 capsule (500 mg total) by mouth every 8 (eight) hours for 6 days. 18 capsule 0  . Multiple Vitamin (MULTIVITAMIN ADULT PO) Take 2 tablets by mouth daily.    . ramipril (ALTACE) 2.5 MG capsule Take 1 capsule (2.5 mg total) by mouth daily. 90 capsule 3  . tamsulosin (FLOMAX) 0.4 MG CAPS capsule Take 1 capsule (0.4 mg total) by mouth daily after supper. 30 capsule 6  . ticagrelor (BRILINTA) 90 MG TABS tablet Take 1 tablet (90 mg total) by mouth 2 (two) times daily. 180 tablet 3   No current facility-administered medications for this visit.    Allergies:   Patient has no known allergies.    Social History:  The patient  reports that he has been smoking cigarettes. He has been smoking about 1.50 packs per day. He has never used smokeless tobacco. He reports current alcohol use of about 4.0 standard drinks of alcohol per week. He reports that he does not use drugs.   Family History:  The patient's family history includes Heart disease in his father.    ROS: All other systems are reviewed and negative. Unless otherwise mentioned in H&P    PHYSICAL EXAM: VS:  BP 102/68   Pulse (!) 115   Ht 6\' 2"  (1.88 m)   Wt 211 lb 9.6 oz (96 kg)   BMI 27.17 kg/m  , BMI Body mass index is 27.17 kg/m. GEN: Well nourished, well developed, in no acute distress HEENT: normal Neck: no JVD, carotid bruits, or masses Cardiac: RRR; tachycardic, no murmurs, rubs, or gallops,no edema  Respiratory: Bilateral crackles and productive coughing. GI: soft, nontender, nondistended, + BS MS: no deformity or atrophy Skin: warm and dry, no rash Neuro:  Strength and sensation are intact Psych: euthymic mood, full  affect   EKG: SR with paroxysmal atrial tachycardia, rates up to 145 bpm.  Personally reviewed    Recent Labs: 08/09/2019: ALT 71 08/10/2019: Magnesium 1.9 08/18/2019: BUN 21; Creatinine, Ser 1.07; Hemoglobin 11.0; Platelets 387; Potassium 3.9; Sodium 137    Lipid Panel    Component Value Date/Time   CHOL 127 08/09/2019 1807   TRIG 112 08/09/2019 2249   HDL 31 (L) 08/09/2019 1807   CHOLHDL 4.1 08/09/2019 1807   VLDL 18 08/09/2019 1807   LDLCALC 78 08/09/2019 1807      Wt Readings from Last 3 Encounters:  08/23/19 211 lb 9.6 oz (96 kg)  08/19/19 205 lb 4.8 oz (93.1 kg)      Other studies Reviewed: LHC 08/09/2019   Prox RCA lesion is 80% stenosed.  Ost RCA to  Prox RCA lesion is 50% stenosed.  1st Diag lesion is 95% stenosed.  Prox LAD to Mid LAD lesion is 75% stenosed.  3rd Mrg lesion is 100% stenosed.  Mid Cx to Dist Cx lesion is 80% stenosed.  A stent was successfully placed.  Post intervention, there is a 0% residual stenosis.  A stent was successfully placed.  Post intervention, there is a 0% residual stenosis.  A drug-eluting stent was successfully placed.  Post intervention, there is a 0% residual stenosis.  Post intervention, there is a 0% residual stenosis.  Diagnostic Dominance: Right  Intervention     ASSESSMENT AND PLAN:  1. CAD:S/P OOH arrest after receiving his COVID vaccine. He has DES to rhe RCA, with LAD disease at the bifurcation with the 1st diagonal. He is to be schedule for staged procedure to have arterectomy of the diagonal. By Dr. Ellyn Hack on Monday, August 29, 2019 at 9:15.  He will continue DAPT with ASA and Brilinta, Coreg 25 mg BID.  The patient understands that risks include but are not limited to stroke (1 in 1000), death (1 in 8), kidney failure [usually temporary] (1 in 500), bleeding (1 in 200), allergic reaction [possibly serious] (1 in 200), and agrees to proceed.   2. Paroxysmal Atrial tachycardia: This has been  evaluated by Dr. Rayann Heman during recent hospitalization.  It was felt to be vagally mediated and reviewed by Dr. Rayann Heman. No therapy or need for pacemaker was planned at that time.   3. Hyperlipidemia:  Continue high dose statin 80 mg daily as directed with goal of LDL <70.   4. Aspiration pneumonia: Continues on Keflex, having 4 more days to complete. Due to continued chest congestion and paroxysmal atrial tachycardia, I will check for CHF as well, with CXR.   Current medicines are reviewed at length with the patient today.  I have spent 30 minutes dedicated to the care of this patient on the date of this encounter to include pre-visit review of records, assessment, management and diagnostic testing,with shared decision making.I have also spoken to Dr. Ellyn Hack vis phone to inform him of his HR.   Labs/ tests ordered today include:  Phill Myron. West Pugh, ANP, AACC   08/23/2019 11:57 AM    [facility] [place] [address] Office [phone] Fax [phone]  Notice: This dictation was prepared with Dragon dictation along with smaller phrase technology. Any transcriptional errors that result from this process are unintentional and may not be corrected upon review.

## 2019-08-22 NOTE — H&P (View-Only) (Signed)
From: Leonie Man, MD  Cardiology Office Note   Date:  08/23/2019   ID:  Halsey, Hammen Mar 13, 1951, MRN [id number]  PCP:  Patient, No Pcp Per  Cardiologist:  Dr. Ellyn Hack  CC: Hospital follow up s/p cardiac arrest.   History of Present Illness: John Love is a 69 y.o. male who presents for ongoing assessment and management of CAD.  He has extensive CAD per recent cardiac cath.  Per cardiac cath he had 80% lesion of the proximal RCA 80% stenosed, ostial to proximal RCA lesion 50% stenosed, 1st diagonal lesion 95% stenosed, proximal LAD to Mid LAD 75% stenosed, mid Cx to Distal Cx lesion 80% stenosed.  A stent was successfully placed to the RCA. He is to have staged LAD diagonal branch bifurcation intervention using orbital atherectomy, given the calcific nature of the disease.    He initially presented to [facility] ED on 08/09/2019 after receiving second COVID vaccine. During his post vaccine 15 minute waiting period, he was noted to have a what appeared to be a seizure before becoming unresponsive. She was given CPR with AED shock for V-fib and was given amiodarone IV. He was intubated and transferred emergently to the cath lab. Results are discussed above.   It was suspected that his MI was not related to anaphylactic vaccine reaction and instead a vagal response to the vaccine with ACS and plaque rupture in the setting of 3 vessel disease. He has been having heartburn symptoms and using Tums a week leading up to the cardiac arrest.   He was also noted to have brief runs of atrial tachycardia, with nocturnal bradycardia and heart block on BB. He was seen by Dr. Rayann Heman who felt this was a vagally mediated response. Nocturnal heart block appeared to have PR prolongation (almost always P-P prolongation that suggest a vagally mediated response). It was also suspected that he had a component of OSA.  He was not recommended for anticoagulation.   He was treated for aspiration pneumonia. He was  also found to have hematuria and was seen by nephrology with foley catheter insertion. He was treated for UTI.   He was discharged on carvedilol, ASA, atorvastatin, ramipril, ticagrelor.   He is here today on follow up to evaluate his status and to discuss staged intervention to the LAD. Dr. Ellyn Hack has been involved in the planning of this visit and conversation with this patient.   He comes today feeling sore in his chest and having chest congestion. He continues to have some productive coughing on antibiotic therapy.  He denies frank chest pain. He has some dyspnea on exertion from his pneumonia.    Past Medical History:  Diagnosis Date  . Cardiac arrest with ventricular fibrillation (HCC) -> 20 minutes CPR with ROSC 08/09/2019  . Multiple vessel coronary artery disease 08/10/2019   Heart Cath -PCI 08/09/2019:  CULPRIT LESION: Mid Cx to Dist Cx lesion is 80% stenosed. 3rd Mrg lesion is 100% stenosed at small branch take-off. DES PCI: 2.25 mm x 28 mm Synergy DES (proximal postdilated 2.75 mm.  Post intervention, there is a 0% residual stenosis.  Small side branch was occluded Ost RCA to Prox RCA lesion is 50% stenosed.Prox RCA lesion is 80% stenosed.  DES PCI: 3.5 mm x 24 mm Sy    Past Surgical History:  Procedure Laterality Date  . CORONARY STENT INTERVENTION N/A 08/09/2019   Procedure: CORONARY STENT INTERVENTION;  Surgeon: Lorretta Harp, MD;  Location: [place] [facility];  Service: Cardiovascular;  Laterality: N/A;  . CYSTOSCOPY N/A 08/18/2019   Procedure: CYSTOSCOPY CLOT EVACUATION FULGURATION 0.5-2 CM.;  Surgeon: Festus Aloe, MD;  Location: [place];  Service: Urology;  Laterality: N/A;  . LEFT HEART CATH AND CORONARY ANGIOGRAPHY N/A 08/09/2019   Procedure: LEFT HEART CATH AND CORONARY ANGIOGRAPHY;  Surgeon: Lorretta Harp, MD;  Location: [place] [facility];  Service: Cardiovascular;  Laterality: N/A;     Current Outpatient Medications  Medication Sig Dispense Refill  . aspirin  EC 81 MG tablet Take 81 mg by mouth daily.    [name] atorvastatin (LIPITOR) 80 MG tablet Take 1 tablet (80 mg total) by mouth daily at 6 PM. 90 tablet 3  . carvedilol (COREG) 25 MG tablet Take 1 tablet (25 mg total) by mouth 2 (two) times daily with a meal. 180 tablet 3  . cephALEXin (KEFLEX) 500 MG capsule Take 1 capsule (500 mg total) by mouth every 8 (eight) hours for 6 days. 18 capsule 0  . Multiple Vitamin (MULTIVITAMIN ADULT PO) Take 2 tablets by mouth daily.    . ramipril (ALTACE) 2.5 MG capsule Take 1 capsule (2.5 mg total) by mouth daily. 90 capsule 3  . tamsulosin (FLOMAX) 0.4 MG CAPS capsule Take 1 capsule (0.4 mg total) by mouth daily after supper. 30 capsule 6  . ticagrelor (BRILINTA) 90 MG TABS tablet Take 1 tablet (90 mg total) by mouth 2 (two) times daily. 180 tablet 3   No current facility-administered medications for this visit.    Allergies:   Patient has no known allergies.    Social History:  The patient  reports that he has been smoking cigarettes. He has been smoking about 1.50 packs per day. He has never used smokeless tobacco. He reports current alcohol use of about 4.0 standard drinks of alcohol per week. He reports that he does not use drugs.   Family History:  The patient's family history includes Heart disease in his father.    ROS: All other systems are reviewed and negative. Unless otherwise mentioned in H&P    PHYSICAL EXAM: VS:  BP 102/68   Pulse (!) 115   Ht 6\' 2"  (1.88 m)   Wt 211 lb 9.6 oz (96 kg)   BMI 27.17 kg/m  , BMI Body mass index is 27.17 kg/m. GEN: Well nourished, well developed, in no acute distress HEENT: normal Neck: no JVD, carotid bruits, or masses Cardiac: RRR; tachycardic, no murmurs, rubs, or gallops,no edema  Respiratory: Bilateral crackles and productive coughing. GI: soft, nontender, nondistended, + BS MS: no deformity or atrophy Skin: warm and dry, no rash Neuro:  Strength and sensation are intact Psych: euthymic mood, full  affect   EKG: SR with paroxysmal atrial tachycardia, rates up to 145 bpm.  Personally reviewed    Recent Labs: 08/09/2019: ALT 71 08/10/2019: Magnesium 1.9 08/18/2019: BUN 21; Creatinine, Ser 1.07; Hemoglobin 11.0; Platelets 387; Potassium 3.9; Sodium 137    Lipid Panel    Component Value Date/Time   CHOL 127 08/09/2019 1807   TRIG 112 08/09/2019 2249   HDL 31 (L) 08/09/2019 1807   CHOLHDL 4.1 08/09/2019 1807   VLDL 18 08/09/2019 1807   LDLCALC 78 08/09/2019 1807      Wt Readings from Last 3 Encounters:  08/23/19 211 lb 9.6 oz (96 kg)  08/19/19 205 lb 4.8 oz (93.1 kg)      Other studies Reviewed: LHC 08/09/2019   Prox RCA lesion is 80% stenosed.  Ost RCA to  Prox RCA lesion is 50% stenosed.  1st Diag lesion is 95% stenosed.  Prox LAD to Mid LAD lesion is 75% stenosed.  3rd Mrg lesion is 100% stenosed.  Mid Cx to Dist Cx lesion is 80% stenosed.  A stent was successfully placed.  Post intervention, there is a 0% residual stenosis.  A stent was successfully placed.  Post intervention, there is a 0% residual stenosis.  A drug-eluting stent was successfully placed.  Post intervention, there is a 0% residual stenosis.  Post intervention, there is a 0% residual stenosis.  Diagnostic Dominance: Right  Intervention     ASSESSMENT AND PLAN:  1. CAD:S/P OOH arrest after receiving his COVID vaccine. He has DES to rhe RCA, with LAD disease at the bifurcation with the 1st diagonal. He is to be schedule for staged procedure to have arterectomy of the diagonal. By Dr. Ellyn Hack on Monday, August 29, 2019 at 9:15.  He will continue DAPT with ASA and Brilinta, Coreg 25 mg BID.  The patient understands that risks include but are not limited to stroke (1 in 1000), death (1 in 68), kidney failure [usually temporary] (1 in 500), bleeding (1 in 200), allergic reaction [possibly serious] (1 in 200), and agrees to proceed.   2. Paroxysmal Atrial tachycardia: This has been  evaluated by Dr. Rayann Heman during recent hospitalization.  It was felt to be vagally mediated and reviewed by Dr. Rayann Heman. No therapy or need for pacemaker was planned at that time.   3. Hyperlipidemia:  Continue high dose statin 80 mg daily as directed with goal of LDL <70.   4. Aspiration pneumonia: Continues on Keflex, having 4 more days to complete. Due to continued chest congestion and paroxysmal atrial tachycardia, I will check for CHF as well, with CXR.   Current medicines are reviewed at length with the patient today.  I have spent 30 minutes dedicated to the care of this patient on the date of this encounter to include pre-visit review of records, assessment, management and diagnostic testing,with shared decision making.I have also spoken to Dr. Ellyn Hack vis phone to inform him of his HR.   Labs/ tests ordered today include:  Phill Myron. West Pugh, ANP, AACC   08/23/2019 11:57 AM    [facility] [place] [address] Office [phone] Fax [phone]  Notice: This dictation was prepared with Dragon dictation along with smaller phrase technology. Any transcriptional errors that result from this process are unintentional and may not be corrected upon review.

## 2019-08-23 ENCOUNTER — Other Ambulatory Visit: Payer: Self-pay

## 2019-08-23 ENCOUNTER — Encounter: Payer: Self-pay | Admitting: Adult Health

## 2019-08-23 ENCOUNTER — Other Ambulatory Visit: Payer: Self-pay | Admitting: Adult Health

## 2019-08-23 ENCOUNTER — Ambulatory Visit (INDEPENDENT_AMBULATORY_CARE_PROVIDER_SITE_OTHER): Payer: BC Managed Care – PPO | Admitting: Adult Health

## 2019-08-23 VITALS — BP 102/68 | HR 115 | Ht 74.0 in | Wt 211.6 lb

## 2019-08-23 DIAGNOSIS — R05 Cough: Secondary | ICD-10-CM | POA: Diagnosis not present

## 2019-08-23 DIAGNOSIS — R310 Gross hematuria: Secondary | ICD-10-CM | POA: Diagnosis not present

## 2019-08-23 DIAGNOSIS — R8271 Bacteriuria: Secondary | ICD-10-CM | POA: Diagnosis not present

## 2019-08-23 DIAGNOSIS — R059 Cough, unspecified: Secondary | ICD-10-CM

## 2019-08-23 DIAGNOSIS — R0989 Other specified symptoms and signs involving the circulatory and respiratory systems: Secondary | ICD-10-CM

## 2019-08-23 DIAGNOSIS — I471 Supraventricular tachycardia: Secondary | ICD-10-CM

## 2019-08-23 DIAGNOSIS — Z01812 Encounter for preprocedural laboratory examination: Secondary | ICD-10-CM | POA: Diagnosis not present

## 2019-08-23 DIAGNOSIS — I2510 Atherosclerotic heart disease of native coronary artery without angina pectoris: Secondary | ICD-10-CM

## 2019-08-23 DIAGNOSIS — N401 Enlarged prostate with lower urinary tract symptoms: Secondary | ICD-10-CM | POA: Diagnosis not present

## 2019-08-23 DIAGNOSIS — R3914 Feeling of incomplete bladder emptying: Secondary | ICD-10-CM | POA: Diagnosis not present

## 2019-08-23 NOTE — Patient Instructions (Addendum)
Medication Instructions:  Continue current medications  *If you need a refill on your cardiac medications before your next appointment, please call your pharmacy*   Lab Work: None   If you have labs (blood work) drawn today and your tests are completely normal, you will receive your results only by: [name] MyChart Message (if you have MyChart) OR . A paper copy in the mail If you have any lab test that is abnormal or we need to change your treatment, we will call you to review the results.   Testing/Procedures: Your physician has requested that you have a cardiac catheterization. Cardiac catheterization is used to diagnose and/or treat various heart conditions. Doctors may recommend this procedure for a number of different reasons. The most common reason is to evaluate chest pain. Chest pain can be a symptom of coronary artery disease (CAD), and cardiac catheterization can show whether plaque is narrowing or blocking your heart's arteries. This procedure is also used to evaluate the valves, as well as measure the blood flow and oxygen levels in different parts of your heart. For further information please visit [email or web address]. Please follow instruction sheet, as given.  A chest x-ray takes a picture of the organs and structures inside the chest, including the heart, lungs, and blood vessels. This test can show several things, including, whether the heart is enlarges; whether fluid is building up in the lungs; and whether pacemaker / defibrillator leads are still in place.   Follow-Up: At [facility], you and your health needs are our priority.  As part of our continuing mission to provide you with exceptional heart care, we have created designated Provider Care Teams.  These Care Teams include your primary Cardiologist (physician) and Advanced Practice Providers (APPs -  Physician Assistants and Nurse Practitioners) who all work together to provide you with the care you need, when you need  it.  We recommend signing up for the patient portal called "MyChart".  Sign up information is provided on this After Visit Summary.  MyChart is used to connect with patients for Virtual Visits (Telemedicine).  Patients are able to view lab/test results, encounter notes, upcoming appointments, etc.  Non-urgent messages can be sent to your provider as well.   To learn more about what you can do with MyChart, go to [email or web address].    Your next appointment:   2 week(s)  The format for your next appointment:   In Person  Provider:   You may see [name], MD or one of the following Advanced Practice Providers on your designated Care Team:    Rosaria Ferries, PA-C  Jory Sims, DNP, ANP  Cadence Kathlen Mody, NP    Other Instructions    [place] [id number] Dept: [place]: [place]  08/23/2019  You are scheduled for a Cardiac Catheterization on Monday, March 15 with Dr. [name].  1. Please arrive at the [facility] (Main Entrance A) at [facility]: [address] [place], [place] [address] at 7:00 AM (This time is two hours before your procedure to ensure your preparation). Free valet parking service is available.   Special note: Every effort is made to have your procedure done on time. Please understand that emergencies sometimes delay scheduled procedures.  2. Diet: Do not eat solid foods after midnight.  The patient may have clear liquids until 5am upon the day of the procedure.  3. Labs:  [place] This is a Drive Up Visit at the [employer] [address], [place]. Someone will direct you to the appropriate testing line. Stay in your car and someone will be with you shortly.    4. Medication instructions in preparation for your procedure:   Contrast Allergy: No  On the morning of  your procedure, take your Aspirin and Brilinta/Ticagrelor and any morning medicines NOT listed above.  You may use sips of water.  5. Plan for one night stay--bring personal belongings. 6. Bring a current list of your medications and current insurance cards. 7. You MUST have a responsible person to drive you home. 8. Someone MUST be with you the first 24 hours after you arrive home or your discharge will be delayed. 9. Please wear clothes that are easy to get on and off and wear slip-on shoes.  Thank you for allowing [place] to care for you!   -- [facility]

## 2019-08-25 ENCOUNTER — Telehealth: Payer: Self-pay | Admitting: *Deleted

## 2019-08-25 ENCOUNTER — Ambulatory Visit
Admission: RE | Admit: 2019-08-25 | Discharge: 2019-08-25 | Disposition: A | Discharge status: Home / Self Care | Payer: BC Managed Care – PPO | Source: Ambulatory Visit | Attending: Adult Health | Admitting: Adult Health

## 2019-08-25 ENCOUNTER — Other Ambulatory Visit: Payer: Self-pay

## 2019-08-25 DIAGNOSIS — R059 Cough, unspecified: Secondary | ICD-10-CM

## 2019-08-25 DIAGNOSIS — R05 Cough: Secondary | ICD-10-CM

## 2019-08-25 DIAGNOSIS — R0602 Shortness of breath: Secondary | ICD-10-CM | POA: Diagnosis not present

## 2019-08-25 DIAGNOSIS — R0989 Other specified symptoms and signs involving the circulatory and respiratory systems: Secondary | ICD-10-CM

## 2019-08-25 NOTE — Telephone Encounter (Addendum)
Pt contacted pre-coronary stent Intervention scheduled at [facility] for: Monday August 29, 2019 9 AM Verified arrival time and place: [place]) at: 7 AM   No solid food after midnight prior to cath, clear liquids until 5 AM day of procedure. Contrast allergy: no  AM meds can be  taken pre-cath with sip of water including: ASA 81 mg Brilinta 90 mg    Confirmed patient has responsible adult to drive home post procedure and observe 24 hours after arriving home: yes  Currently, due to Covid-19 pandemic, only one person will be allowed with patient. Must be the same person for patient's entire stay and will be required to wear a mask. They will be asked to wait in the waiting room for the duration of the patient's stay.  Patients are required to wear a mask when they enter the hospital.      COVID-19 Pre-Screening Questions:  . In the past 7 to 10 days have you had a cough,  shortness of breath, headache, congestion, fever (100 or greater) body aches, chills, sore throat, or sudden loss of taste or sense of smell? no . Have you been around anyone with known Covid 19 in the past 7-10 days? no . Have you been around anyone who is awaiting Covid 19 test results in the past 7 to 10 days? no . Have you been around anyone who has been exposed to Covid 19, or has mentioned symptoms of Covid 19 within the past 7 to 10 days? no   I reviewed procedure/mask/visitor instructions,COVID-19 screening questions with patient, he verbalized understanding.

## 2019-08-26 ENCOUNTER — Other Ambulatory Visit (HOSPITAL_COMMUNITY)
Admission: RE | Admit: 2019-08-26 | Discharge: 2019-08-26 | Disposition: A | Discharge status: Home / Self Care | Payer: BC Managed Care – PPO | Source: Ambulatory Visit | Attending: Cardiology | Admitting: Cardiology

## 2019-08-26 DIAGNOSIS — Z01812 Encounter for preprocedural laboratory examination: Secondary | ICD-10-CM | POA: Insufficient documentation

## 2019-08-26 DIAGNOSIS — Z20822 Contact with and (suspected) exposure to covid-19: Secondary | ICD-10-CM | POA: Diagnosis not present

## 2019-08-27 LAB — SARS CORONAVIRUS 2 (TAT 6-24 HRS): SARS Coronavirus 2: NEGATIVE

## 2019-08-29 ENCOUNTER — Encounter (HOSPITAL_COMMUNITY)
Admission: RE | Disposition: A | Discharge status: Home / Self Care | Payer: Self-pay | Source: Home / Self Care | Attending: Cardiology

## 2019-08-29 ENCOUNTER — Other Ambulatory Visit: Payer: Self-pay

## 2019-08-29 ENCOUNTER — Ambulatory Visit (HOSPITAL_COMMUNITY)
Admission: RE | Admit: 2019-08-29 | Discharge: 2019-08-30 | Disposition: A | Discharge status: Home / Self Care | Payer: BC Managed Care – PPO | Attending: Cardiology | Admitting: Cardiology

## 2019-08-29 DIAGNOSIS — I25119 Atherosclerotic heart disease of native coronary artery with unspecified angina pectoris: Secondary | ICD-10-CM | POA: Diagnosis not present

## 2019-08-29 DIAGNOSIS — J690 Pneumonitis due to inhalation of food and vomit: Secondary | ICD-10-CM | POA: Insufficient documentation

## 2019-08-29 DIAGNOSIS — Z7982 Long term (current) use of aspirin: Secondary | ICD-10-CM | POA: Insufficient documentation

## 2019-08-29 DIAGNOSIS — I4901 Ventricular fibrillation: Secondary | ICD-10-CM | POA: Diagnosis present

## 2019-08-29 DIAGNOSIS — I471 Supraventricular tachycardia: Secondary | ICD-10-CM | POA: Diagnosis not present

## 2019-08-29 DIAGNOSIS — Z8674 Personal history of sudden cardiac arrest: Secondary | ICD-10-CM | POA: Diagnosis not present

## 2019-08-29 DIAGNOSIS — I25118 Atherosclerotic heart disease of native coronary artery with other forms of angina pectoris: Secondary | ICD-10-CM | POA: Diagnosis present

## 2019-08-29 DIAGNOSIS — Z955 Presence of coronary angioplasty implant and graft: Secondary | ICD-10-CM | POA: Insufficient documentation

## 2019-08-29 DIAGNOSIS — I2510 Atherosclerotic heart disease of native coronary artery without angina pectoris: Secondary | ICD-10-CM

## 2019-08-29 DIAGNOSIS — F17210 Nicotine dependence, cigarettes, uncomplicated: Secondary | ICD-10-CM | POA: Insufficient documentation

## 2019-08-29 DIAGNOSIS — Z8249 Family history of ischemic heart disease and other diseases of the circulatory system: Secondary | ICD-10-CM | POA: Diagnosis not present

## 2019-08-29 DIAGNOSIS — I255 Ischemic cardiomyopathy: Secondary | ICD-10-CM | POA: Diagnosis not present

## 2019-08-29 DIAGNOSIS — I469 Cardiac arrest, cause unspecified: Secondary | ICD-10-CM | POA: Diagnosis present

## 2019-08-29 DIAGNOSIS — Z9861 Coronary angioplasty status: Secondary | ICD-10-CM | POA: Diagnosis not present

## 2019-08-29 DIAGNOSIS — E785 Hyperlipidemia, unspecified: Secondary | ICD-10-CM | POA: Insufficient documentation

## 2019-08-29 DIAGNOSIS — Z79899 Other long term (current) drug therapy: Secondary | ICD-10-CM | POA: Diagnosis not present

## 2019-08-29 HISTORY — PX: CORONARY ATHERECTOMY: CATH118238

## 2019-08-29 HISTORY — PX: CORONARY STENT INTERVENTION: CATH118234

## 2019-08-29 LAB — POCT ACTIVATED CLOTTING TIME
Activated Clotting Time: 263 seconds
Activated Clotting Time: 257 seconds
Activated Clotting Time: 274 seconds
Activated Clotting Time: 307 seconds
Activated Clotting Time: 235 seconds
Activated Clotting Time: 236 seconds

## 2019-08-29 SURGERY — CORONARY STENT INTERVENTION
Anesthesia: LOCAL

## 2019-08-29 MED ORDER — FENTANYL CITRATE (PF) 100 MCG/2ML IJ SOLN
INTRAMUSCULAR | Status: AC
  Filled 2019-08-29: qty 2

## 2019-08-29 MED ORDER — SODIUM CHLORIDE 0.9 % IV SOLN: 250.0000 mL | INTRAVENOUS | Status: DC | PRN

## 2019-08-29 MED ORDER — RAMIPRIL 2.5 MG PO CAPS
2.5000 mg | ORAL_CAPSULE | Freq: Every day | ORAL | Status: DC
  Administered 2019-08-30: 2.5 mg via ORAL
  Filled 2019-08-29: qty 1

## 2019-08-29 MED ORDER — FUROSEMIDE 10 MG/ML IJ SOLN
40.0000 mg | Freq: Once | INTRAMUSCULAR | Status: AC
  Administered 2019-08-29: 40 mg via INTRAVENOUS
  Filled 2019-08-29: qty 4

## 2019-08-29 MED ORDER — IOHEXOL 350 MG/ML SOLN
INTRAVENOUS | Status: AC
  Filled 2019-08-29: qty 1

## 2019-08-29 MED ORDER — ONDANSETRON HCL 4 MG/2ML IJ SOLN: 4.0000 mg | Freq: Four times a day (QID) | INTRAMUSCULAR | Status: DC | PRN

## 2019-08-29 MED ORDER — SODIUM CHLORIDE 0.9% FLUSH
3.0000 mL | Freq: Two times a day (BID) | INTRAVENOUS | Status: DC
  Administered 2019-08-29: 3 mL via INTRAVENOUS

## 2019-08-29 MED ORDER — HEPARIN (PORCINE) IN NACL 1000-0.9 UT/500ML-% IV SOLN
INTRAVENOUS | Status: DC | PRN
  Administered 2019-08-29 (×2): 500 mL

## 2019-08-29 MED ORDER — TAMSULOSIN HCL 0.4 MG PO CAPS
0.4000 mg | ORAL_CAPSULE | Freq: Every day | ORAL | Status: DC
  Administered 2019-08-29: 0.4 mg via ORAL
  Filled 2019-08-29: qty 1

## 2019-08-29 MED ORDER — MIDAZOLAM HCL 2 MG/2ML IJ SOLN
INTRAMUSCULAR | Status: AC
  Filled 2019-08-29: qty 2

## 2019-08-29 MED ORDER — TICAGRELOR 90 MG PO TABS
90.0000 mg | ORAL_TABLET | Freq: Two times a day (BID) | ORAL | Status: DC
  Administered 2019-08-29 – 2019-08-30 (×2): 90 mg via ORAL
  Filled 2019-08-29 (×2): qty 1

## 2019-08-29 MED ORDER — TIROFIBAN (AGGRASTAT) BOLUS VIA INFUSION
INTRAVENOUS | Status: DC | PRN
  Administered 2019-08-29: 2325 ug via INTRAVENOUS

## 2019-08-29 MED ORDER — HEPARIN SODIUM (PORCINE) 1000 UNIT/ML IJ SOLN
INTRAMUSCULAR | Status: AC
  Filled 2019-08-29: qty 1

## 2019-08-29 MED ORDER — VERAPAMIL HCL 2.5 MG/ML IV SOLN
INTRAVENOUS | Status: DC | PRN
  Administered 2019-08-29: 500 ug via INTRACORONARY

## 2019-08-29 MED ORDER — SODIUM CHLORIDE 0.9% FLUSH: 3.0000 mL | INTRAVENOUS | Status: DC | PRN

## 2019-08-29 MED ORDER — ACETAMINOPHEN 500 MG PO TABS: 1000.0000 mg | ORAL_TABLET | Freq: Four times a day (QID) | ORAL | Status: DC | PRN

## 2019-08-29 MED ORDER — ASPIRIN EC 81 MG PO TBEC
81.0000 mg | DELAYED_RELEASE_TABLET | Freq: Every day | ORAL | Status: DC
  Administered 2019-08-30: 81 mg via ORAL
  Filled 2019-08-29: qty 1

## 2019-08-29 MED ORDER — SODIUM CHLORIDE 0.9 % WEIGHT BASED INFUSION
1.0000 mL/kg/h | INTRAVENOUS | Status: AC
  Administered 2019-08-29: 1 mL/kg/h via INTRAVENOUS

## 2019-08-29 MED ORDER — CARVEDILOL 25 MG PO TABS
25.0000 mg | ORAL_TABLET | Freq: Two times a day (BID) | ORAL | Status: DC
  Administered 2019-08-29: 25 mg via ORAL
  Filled 2019-08-29: qty 1

## 2019-08-29 MED ORDER — IOHEXOL 350 MG/ML SOLN
INTRAVENOUS | Status: DC | PRN
  Administered 2019-08-29: 290 mL

## 2019-08-29 MED ORDER — HEPARIN (PORCINE) IN NACL 1000-0.9 UT/500ML-% IV SOLN
INTRAVENOUS | Status: AC
  Filled 2019-08-29: qty 1000

## 2019-08-29 MED ORDER — ATORVASTATIN CALCIUM 80 MG PO TABS
80.0000 mg | ORAL_TABLET | Freq: Every day | ORAL | Status: DC
  Administered 2019-08-29: 80 mg via ORAL
  Filled 2019-08-29: qty 1

## 2019-08-29 MED ORDER — SODIUM CHLORIDE 0.9% FLUSH: 3.0000 mL | Freq: Two times a day (BID) | INTRAVENOUS | Status: DC

## 2019-08-29 MED ORDER — LIDOCAINE HCL (PF) 1 % IJ SOLN
INTRAMUSCULAR | Status: DC | PRN
  Administered 2019-08-29: 2 mL

## 2019-08-29 MED ORDER — SODIUM CHLORIDE 0.9 % WEIGHT BASED INFUSION: 1.0000 mL/kg/h | INTRAVENOUS | Status: DC

## 2019-08-29 MED ORDER — TIROFIBAN HCL IN NACL 5-0.9 MG/100ML-% IV SOLN
INTRAVENOUS | Status: AC
  Filled 2019-08-29: qty 100

## 2019-08-29 MED ORDER — VERAPAMIL HCL 2.5 MG/ML IV SOLN
INTRAVENOUS | Status: DC | PRN
  Administered 2019-08-29: 10 mL via INTRA_ARTERIAL

## 2019-08-29 MED ORDER — ACETAMINOPHEN 325 MG PO TABS
650.0000 mg | ORAL_TABLET | ORAL | Status: DC | PRN
  Administered 2019-08-29 – 2019-08-30 (×3): 650 mg via ORAL
  Filled 2019-08-29 (×3): qty 2

## 2019-08-29 MED ORDER — LABETALOL HCL 5 MG/ML IV SOLN: 10.0000 mg | INTRAVENOUS | Status: AC | PRN

## 2019-08-29 MED ORDER — SODIUM CHLORIDE 0.9 % WEIGHT BASED INFUSION
3.0000 mL/kg/h | INTRAVENOUS | Status: DC
  Administered 2019-08-29: 3 mL/kg/h via INTRAVENOUS

## 2019-08-29 MED ORDER — ADULT MULTIVITAMIN W/MINERALS CH
1.0000 | ORAL_TABLET | Freq: Every day | ORAL | Status: DC
  Administered 2019-08-30: 1 via ORAL
  Filled 2019-08-29: qty 1

## 2019-08-29 MED ORDER — NITROGLYCERIN 1 MG/10 ML FOR IR/CATH LAB
INTRA_ARTERIAL | Status: AC
  Filled 2019-08-29: qty 10

## 2019-08-29 MED ORDER — MIDAZOLAM HCL 2 MG/2ML IJ SOLN
INTRAMUSCULAR | Status: DC | PRN
  Administered 2019-08-29: 2 mg via INTRAVENOUS
  Administered 2019-08-29: 1 mg via INTRAVENOUS

## 2019-08-29 MED ORDER — HEPARIN SODIUM (PORCINE) 1000 UNIT/ML IJ SOLN
INTRAMUSCULAR | Status: DC | PRN
  Administered 2019-08-29 (×2): 2000 [IU] via INTRAVENOUS
  Administered 2019-08-29 (×2): 3000 [IU] via INTRAVENOUS
  Administered 2019-08-29: 4000 [IU] via INTRAVENOUS
  Administered 2019-08-29: 8000 [IU] via INTRAVENOUS

## 2019-08-29 MED ORDER — FENTANYL CITRATE (PF) 100 MCG/2ML IJ SOLN
INTRAMUSCULAR | Status: DC | PRN
  Administered 2019-08-29 (×2): 50 ug via INTRAVENOUS
  Administered 2019-08-29 (×2): 25 ug via INTRAVENOUS

## 2019-08-29 MED ORDER — LIDOCAINE HCL (PF) 1 % IJ SOLN
INTRAMUSCULAR | Status: AC
  Filled 2019-08-29: qty 30

## 2019-08-29 MED ORDER — NITROGLYCERIN 1 MG/10 ML FOR IR/CATH LAB
INTRA_ARTERIAL | Status: DC | PRN
  Administered 2019-08-29 (×3): 200 ug via INTRACORONARY

## 2019-08-29 MED ORDER — VIPERSLIDE LUBRICANT OPTIME
TOPICAL | Status: DC | PRN
  Administered 2019-08-29: 20 mL via SURGICAL_CAVITY

## 2019-08-29 MED ORDER — HYDRALAZINE HCL 20 MG/ML IJ SOLN: 10.0000 mg | INTRAMUSCULAR | Status: AC | PRN

## 2019-08-29 MED ORDER — VERAPAMIL HCL 2.5 MG/ML IV SOLN
INTRAVENOUS | Status: AC
  Filled 2019-08-29: qty 2

## 2019-08-29 SURGICAL SUPPLY — 41 items
BALLN SAPPHIRE 2.0X12 (BALLOONS) ×2
BALLN SAPPHIRE 2.0X15 (BALLOONS) ×2
BALLN SAPPHIRE 2.5X20 (BALLOONS) ×2
BALLN SAPPHIRE ~~LOC~~ 2.75X12 (BALLOONS) ×2 IMPLANT
BALLN SAPPHIRE ~~LOC~~ 3.0X18 (BALLOONS) ×2 IMPLANT
BALLN WOLVERINE 2.50X10 (BALLOONS) ×2
BALLN ~~LOC~~ EMERGE MR 3.5X6 (BALLOONS) ×2
BALLOON SAPPHIRE 2.0X12 (BALLOONS) ×1 IMPLANT
BALLOON SAPPHIRE 2.0X15 (BALLOONS) ×1 IMPLANT
BALLOON SAPPHIRE 2.5X20 (BALLOONS) ×1 IMPLANT
BALLOON WOLVERINE 2.50X10 (BALLOONS) ×1 IMPLANT
BALLOON ~~LOC~~ EMERGE MR 3.5X6 (BALLOONS) ×1 IMPLANT
CATH TELEPORT (CATHETERS) ×2 IMPLANT
CATH TELESCOPE 6F GEC (CATHETERS) ×2 IMPLANT
CATH VISTA GUIDE 7FRXB LAD 3.5 (CATHETERS) ×2 IMPLANT
CROWN DIAMONDBACK CLASSIC 1.25 (BURR) ×2 IMPLANT
DEVICE RAD COMP TR BAND LRG (VASCULAR PRODUCTS) ×2 IMPLANT
ELECT DEFIB PAD ADLT CADENCE (PAD) ×2 IMPLANT
GLIDESHEATH SLENDER 7FR .021G (SHEATH) ×2 IMPLANT
GUIDEWIRE INQWIRE 1.5J.035X260 (WIRE) ×1 IMPLANT
INQWIRE 1.5J .035X260CM (WIRE) ×2
KIT ENCORE 26 ADVANTAGE (KITS) ×4 IMPLANT
KIT HEART LEFT (KITS) ×2 IMPLANT
LUBRICANT VIPERSLIDE CORONARY (MISCELLANEOUS) ×2 IMPLANT
PACK CARDIAC CATHETERIZATION (CUSTOM PROCEDURE TRAY) ×2 IMPLANT
SHEATH PROBE COVER 6X72 (BAG) ×2 IMPLANT
STENT SYNERGY XD 2.50X16 (Permanent Stent) ×1 IMPLANT
STENT SYNERGY XD 2.75X16 (Permanent Stent) ×1 IMPLANT
STENT SYNERGY XD 3.0X16 (Permanent Stent) ×1 IMPLANT
STENT SYNERGY XD 3.0X38 (Permanent Stent) ×1 IMPLANT
SYNERGY XD 2.50X16 (Permanent Stent) ×2 IMPLANT
SYNERGY XD 2.75X16 (Permanent Stent) ×2 IMPLANT
SYNERGY XD 3.0X16 (Permanent Stent) ×2 IMPLANT
SYNERGY XD 3.0X38 (Permanent Stent) ×2 IMPLANT
TRANSDUCER W/STOPCOCK (MISCELLANEOUS) ×2 IMPLANT
TUBING CIL FLEX 10 FLL-RA (TUBING) ×2 IMPLANT
WIRE ASAHI PROWATER 180CM (WIRE) ×2 IMPLANT
WIRE ASAHI PROWATER 300CM (WIRE) ×2 IMPLANT
WIRE PT2 MS 185 (WIRE) ×2 IMPLANT
WIRE RUNTHROUGH .014X180CM (WIRE) ×4 IMPLANT
WIRE VIPERWIRE COR FLEX .012 (WIRE) ×2 IMPLANT

## 2019-08-29 NOTE — Interval H&P Note (Signed)
History and Physical Interval Note:  08/29/2019 8:48 AM  John Love  has presented today for surgery, with the diagnosis of LAD-DIAG stenosis with Ischemic CM post Cardiac Arrest.   The various methods of treatment have been discussed with the patient and family. After consideration of risks, benefits and other options for treatment, the patient has consented to  Procedure(s): CORONARY STENT INTERVENTION (N/A)  CORONARY ATHERECTOMY   as a surgical intervention.  The patient's history has been reviewed, patient examined, no change in status, stable for surgery.  I have reviewed the patient's chart and labs.  Questions were answered to the patient's satisfaction.    Cath Lab Visit (complete for each Cath Lab visit)  Clinical Evaluation Leading to the Procedure:   ACS: Yes.    Non-ACS:    Anginal Classification: CCS II  Anti-ischemic medical therapy: Maximal Therapy (2 or more classes of medications)  Non-Invasive Test Results: No non-invasive testing performed -initial cardiac catheterization revealed severe three-vessel disease involving LCx-OM, RCA and LAD having diagonal.  The LCx-OM and RCA were treated during the original catheterization, however the LAD-diagonal was heavily calcified and not likely to be an acute lesion.  The plan was for staged PCI.  Prior CABG: No previous CABG  John Love

## 2019-08-29 NOTE — Plan of Care (Signed)

## 2019-08-30 ENCOUNTER — Encounter (HOSPITAL_COMMUNITY): Payer: Self-pay | Admitting: Cardiology

## 2019-08-30 DIAGNOSIS — Z955 Presence of coronary angioplasty implant and graft: Secondary | ICD-10-CM | POA: Diagnosis not present

## 2019-08-30 DIAGNOSIS — I471 Supraventricular tachycardia: Secondary | ICD-10-CM | POA: Diagnosis not present

## 2019-08-30 DIAGNOSIS — E785 Hyperlipidemia, unspecified: Secondary | ICD-10-CM | POA: Diagnosis not present

## 2019-08-30 DIAGNOSIS — Z8674 Personal history of sudden cardiac arrest: Secondary | ICD-10-CM | POA: Diagnosis not present

## 2019-08-30 DIAGNOSIS — I10 Essential (primary) hypertension: Secondary | ICD-10-CM

## 2019-08-30 DIAGNOSIS — Z79899 Other long term (current) drug therapy: Secondary | ICD-10-CM | POA: Diagnosis not present

## 2019-08-30 DIAGNOSIS — Z8249 Family history of ischemic heart disease and other diseases of the circulatory system: Secondary | ICD-10-CM | POA: Diagnosis not present

## 2019-08-30 DIAGNOSIS — I255 Ischemic cardiomyopathy: Secondary | ICD-10-CM | POA: Diagnosis not present

## 2019-08-30 DIAGNOSIS — Z9861 Coronary angioplasty status: Secondary | ICD-10-CM | POA: Diagnosis not present

## 2019-08-30 DIAGNOSIS — F17210 Nicotine dependence, cigarettes, uncomplicated: Secondary | ICD-10-CM | POA: Diagnosis not present

## 2019-08-30 DIAGNOSIS — Z7982 Long term (current) use of aspirin: Secondary | ICD-10-CM | POA: Diagnosis not present

## 2019-08-30 DIAGNOSIS — I2510 Atherosclerotic heart disease of native coronary artery without angina pectoris: Secondary | ICD-10-CM | POA: Diagnosis not present

## 2019-08-30 DIAGNOSIS — J690 Pneumonitis due to inhalation of food and vomit: Secondary | ICD-10-CM | POA: Diagnosis not present

## 2019-08-30 DIAGNOSIS — I25119 Atherosclerotic heart disease of native coronary artery with unspecified angina pectoris: Secondary | ICD-10-CM | POA: Diagnosis not present

## 2019-08-30 LAB — BASIC METABOLIC PANEL
Anion gap: 11 (ref 5–15)
BUN: 12 mg/dL (ref 8–23)
CO2: 23 mmol/L (ref 22–32)
Calcium: 8.7 mg/dL — ABNORMAL LOW (ref 8.9–10.3)
Chloride: 105 mmol/L (ref 98–111)
Creatinine, Ser: 0.95 mg/dL (ref 0.61–1.24)
GFR calc Af Amer: >60 mL/min (ref >60)
GFR calc non Af Amer: >60 mL/min (ref >60)
Glucose, Bld: 93 mg/dL (ref 70–99)
Potassium: 3.7 mmol/L (ref 3.5–5.1)
Sodium: 139 mmol/L (ref 135–145)

## 2019-08-30 LAB — CBC
HCT: 29 % — ABNORMAL LOW (ref 39.0–52.0)
Hemoglobin: 9.5 g/dL — ABNORMAL LOW (ref 13.0–17.0)
MCH: 25 pg — ABNORMAL LOW (ref 26.0–34.0)
MCHC: 32.8 g/dL (ref 30.0–36.0)
MCV: 76.3 fL — ABNORMAL LOW (ref 80.0–100.0)
Platelets: 437 10*3/uL — ABNORMAL HIGH (ref 150–400)
RBC: 3.8 MIL/uL — ABNORMAL LOW (ref 4.22–5.81)
RDW: 15.5 % (ref 11.5–15.5)
WBC: 9 10*3/uL (ref 4.0–10.5)
nRBC: 0 % (ref 0.0–0.2)

## 2019-08-30 MED ORDER — ANGIOPLASTY BOOK
Freq: Once | Status: AC
  Filled 2019-08-30: qty 1

## 2019-08-30 MED ORDER — THE SENSUOUS HEART BOOK
Freq: Once | Status: AC
  Filled 2019-08-30: qty 1

## 2019-08-30 MED FILL — Tirofiban HCl in NaCl 0.9% IV Soln 5 MG/100ML (Base Equiv): INTRAVENOUS | Qty: 100 | Status: AC

## 2019-08-30 NOTE — Progress Notes (Signed)
Pt.had 4.6non conducting sinus escape beat in the monitor ;Pt was asymptomatic.V/s stable.Will continue to monitor pt.

## 2019-08-30 NOTE — Discharge Summary (Addendum)
Discharge Summary    Patient ID: John Love,  MRN: [id number], DOB/AGE: 68-Jul-1952 68 y.o.  Admit date: 08/29/2019 Discharge date: 08/30/2019  Primary Care Provider: Patient, No Pcp Per Primary Cardiologist: [name], MD  Discharge Diagnoses    Active Problems:   Cardiac arrest with ventricular fibrillation ([place]) -> 20 minutes CPR with ROSC   Multiple vessel coronary artery disease   CAD S/P percutaneous coronary angioplasty  Allergies No Known Allergies  Diagnostic Studies/Procedures    Cath: 08/29/19   Prox LAD to Mid LAD lesion is 75% stenosed with 95% stenosed side branch in 1st Diag.  Overall atherectomy was performed on the proximal to mid LAD lesions.  Scoring balloon angioplasty was performed on the ostium of the side branch, using a BALLOON WOLVERINE 2.50X10.  A drug-eluting stent was successfully placed from the LAD into the sidebranch using a SYNERGY XD 2.75X16. (Reverse Culotte technique) -> postdilated to 2.9 mm  Post intervention, the side branch was reduced to 20% residual stenosis.  Lat 1st Diag lesion is 95% stenosed -likely related to distal wire related spasm, following intracoronary verapamil: Post intervention, there is a 45% residual stenosis.  A drug-eluting stent was successfully placed from proximal LAD through the sidebranch stent (after kissing balloon and POT preparation) using a SYNERGY XD 3.0X38. -Postdilated proximally using final POT to 3.6 mm and distally to 3.1 mm  Post intervention, there is a 0% residual stenosis.  Mid LAD to Dist LAD distal edge dissection noted after the initial stent.  A second drug-eluting stent was successfully placed overlapping the original stent distally, using a SYNERGY XD 3.0X16. Postdilated to 3.1 mm.  A third distal overlapping drug-eluting stent was successfully placed using a SYNERGY XD 2.75X16. Postdilated distally to 2.8 mm and proximal to 3.1 mm.  Post intervention, there is a 0%  residual stenosis.  After initial stent placement in the LAD, there was likely distalization and distal wire dissection revealing: Dist LAD lesion is 100% stenosed.  Balloon angioplasty was performed using a BALLOON SAPPHIRE 2.0X15.  Post intervention, there is a 20% residual stenosis with restoration of TIMI-3 flow to the apex.   SUMMARY  Successful bifurcation PCI with orbital atherectomy based DES PCI of LAD, and sidebranch scoring balloon angioplasty and DES stenting of 1st Diag in a Reverse Culotte technique.  Coverage of distal edge dissection in LAD with 2 overlapping downstream stents and apical PTCA.  Minor distal branch of D1 very distal wire dissection reperfusing at end of PCI.  Unable to perform final kissing balloons due to angulation of sidebranch and inability to pass a balloon through overlapping stent.  RECOMMENDATIONS  Monitor overnight and continue home medications.  Follow-up as scheduled  Continue to titrate goal-directed medical therapy (GDMT) medications as blood pressure tolerates.  Anticipate reassessing EF in roughly 3 months with medical optimization and complete revascularization now done.  [name], M.D., M.S. Interventional Cardiologist   Diagnostic Dominance: Right  Intervention    _____________   History of Present Illness     69 y.o. male who presented back to the office for ongoing assessment and management of CAD.  He has extensive CAD per recent cardiac cath.  Per cardiac cath he had 80% lesion of the proximal RCA 80% stenosed, ostial to proximal RCA lesion 50% stenosed, 1st diagonal lesion 95% stenosed, proximal LAD to Mid LAD 75% stenosed, mid Cx to Distal Cx lesion 80% stenosed.  A stent was successfully placed to the RCA. He is  to have staged LAD diagonal branch bifurcation intervention using orbital atherectomy, given the calcific nature of the disease.    He initially presented to [facility] ED on 08/09/2019 after receiving  second COVID vaccine. During his post vaccine 15 minute waiting period, he was noted to have a what appeared to be a seizure before becoming unresponsive. She was given CPR with AED shock for V-fib and was given amiodarone IV. He was intubated and transferred emergently to the cath lab. Results are discussed above.   It was suspected that his MI was not related to anaphylactic vaccine reaction and instead a vagal response to the vaccine with ACS and plaque rupture in the setting of 3 vessel disease. He has been having heartburn symptoms and using Tums a week leading up to the cardiac arrest.   He was also noted to have brief runs of atrial tachycardia, with nocturnal bradycardia and heart block on BB. He was seen by Dr. Rayann Heman who felt this was a vagally mediated response.Nocturnal heart block appeared to have PR prolongation (almost always P-P prolongation that suggest a vagally mediated response). It was also suspected that he had a component of OSA.  He was not recommended for anticoagulation.   He was treated for aspiration pneumonia. He was also found to have hematuria and was seen by nephrology with foley catheter insertion. He was treated for UTI.   He was discharged on carvedilol, ASA, atorvastatin, ramipril, ticagrelor.   At this follow up appt, he was scheduled for staged intervention to the LAD.   Hospital Course     Underwent successful bifurcation PCI/DESx2 with orbital atherectomy of the LAD with sidebranch scoring balloon angioplasty and DES x1 to the 1st Diag. Did have distal edge dissection in the LAD which was covered with overlapping stents. He will be continued on DAPT with ASA/Brilinta. No complications noted overnight. Worked well with cardiac rehab the following morning. He was continued on home medications without adjustment. Of note did have episode of nocturnal bradycardia and atrial tachycardia similar to previous admission. Will need to be set up for outpatient sleep  study.   General: Well developed, well nourished, male appearing in no acute distress. Head: Normocephalic, atraumatic.  Neck: Supple without bruits, JVD. Lungs:  Resp regular and unlabored, CTA. Heart: RRR, S1, S2, no S3, S4, or murmur; no rub. Abdomen: Soft, non-tender, non-distended with normoactive bowel sounds. No hepatomegaly. No rebound/guarding. No obvious abdominal masses. Extremities: No clubbing, cyanosis, edema. Distal pedal pulses are 2+ bilaterally. Right cath site stable without bruising or hematoma Neuro: Alert and oriented X 3. Moves all extremities spontaneously. Psych: Normal affect.  John Love was seen by Dr. Irish Lack and determined stable for discharge home. Follow up in the office has been arranged. Medications are listed below.   _____________  Discharge Vitals Blood pressure 138/72, pulse (!) 56, temperature 98.1 F (36.7 C), temperature source Oral, resp. rate 18, height _0  (1.88 m), weight 90.8 kg, SpO2 99 %.  Filed Weights   08/29/19 0752 08/30/19 0513  Weight: 93 kg 90.8 kg    Labs & Radiologic Studies    CBC Recent Labs    08/30/19 0241  WBC 9.0  HGB 9.5*  HCT 29.0*  MCV 76.3*  PLT 734*   Basic Metabolic Panel Recent Labs    08/30/19 0241  NA 139  K 3.7  CL 105  CO2 23  GLUCOSE 93  BUN 12  CREATININE 0.95  CALCIUM 8.7*   Liver Function Tests  No results for input(s): AST, ALT, ALKPHOS, BILITOT, PROT, ALBUMIN in the last 72 hours. No results for input(s): LIPASE, AMYLASE in the last 72 hours. Cardiac Enzymes No results for input(s): CKTOTAL, CKMB, CKMBINDEX, TROPONINI in the last 72 hours. BNP Invalid input(s): POCBNP D-Dimer No results for input(s): DDIMER in the last 72 hours. Hemoglobin A1C No results for input(s): HGBA1C in the last 72 hours. Fasting Lipid Panel No results for input(s): CHOL, HDL, LDLCALC, TRIG, CHOLHDL, LDLDIRECT in the last 72 hours. Thyroid Function Tests No results for input(s): TSH, T4TOTAL,  T3FREE, THYROIDAB in the last 72 hours.  Invalid input(s): FREET3 _____________  CT ABDOMEN PELVIS WO CONTRAST  Result Date: 08/11/2019 CLINICAL DATA:  Hematuria. Patient status post cardiac catheterization 08/09/2019. EXAM: CT ABDOMEN AND PELVIS WITHOUT CONTRAST TECHNIQUE: Multidetector CT imaging of the abdomen and pelvis was performed following the standard protocol without IV contrast. COMPARISON:  . single-view of the chest 08/10/2019 and 08/11/2019. FINDINGS: Lower chest: There is a small right pleural effusion and dense airspace disease in the posterior right lower lobe. Trace left effusion is also identified. Heart size is upper normal. No pericardial effusion. Hepatobiliary: There is contrast in the gallbladder from the patient's cardiac catheterization. A single punctate calcification in the posterior right hepatic lobe is incidentally noted. The liver is otherwise unremarkable. Biliary tree appears normal. Pancreas: Unremarkable. No pancreatic ductal dilatation or surrounding inflammatory changes. Spleen: Normal in size without focal abnormality. Adrenals/Urinary Tract: The adrenal glands appear normal. The kidneys also appear normal without stone or hydronephrosis. No renal mass on infused examination. A Foley catheter is in place in the urinary bladder with some associated air in the bladder. The bladder is decompressed and its walls are severely thickened. The degree of thickening is out of proportion to decompression. Stomach/Bowel: Stomach is within normal limits. Appendix appears normal. No evidence of bowel wall thickening, distention, or inflammatory changes. A few scattered colonic diverticula are noted. Vascular/Lymphatic: Aortic atherosclerosis. No enlarged abdominal or pelvic lymph nodes. Reproductive: Prostatomegaly. Other: None. Musculoskeletal: No acute or focal abnormality. IMPRESSION: Marked thickening of the walls of the urinary bladder could be due to cystitis and/or bladder  outlet obstruction in this patient with prostatomegaly. No other explanation for hematuria is present. Negative for urinary tract stone. Small right pleural effusion with dense airspace disease in the posterior right lower lobe which could be due to atelectasis or pneumonia. Scattered diverticulosis without diverticulitis. Atherosclerosis. Electronically Signed   By: Inge Rise M.D.   On: 08/11/2019 11:24   DG Chest 2 View  Result Date: 08/25/2019 CLINICAL DATA:  Shortness of breath and cough EXAM: CHEST - 2 VIEW COMPARISON:  08/17/2019, 08/09/2019 FINDINGS: Persistent airspace disease at the right lower lobe. The left lung is grossly clear. Probable tiny right pleural effusion. Stable cardiomediastinal silhouette. No pneumothorax. IMPRESSION: No significant interval change in trace right pleural effusion and persistent consolidative process at the right lower lobe Electronically Signed   By: Donavan Foil M.D.   On: 08/25/2019 21:33   DG Chest 2 View  Result Date: 08/17/2019 CLINICAL DATA:  Preoperative evaluation, smoker, coronary artery disease EXAM: CHEST - 2 VIEW COMPARISON:  08/14/2019 FINDINGS: Upper normal size of cardiac silhouette. Mediastinal contours and pulmonary vascularity normal. RIGHT lower lobe atelectasis versus consolidation unchanged. Remaining lungs clear. No pleural effusion or pneumothorax. Mild levoconvex thoracic scoliosis. IMPRESSION: Persistent atelectasis versus consolidation of RIGHT lower lobe. Electronically Signed   By: Lavonia Dana M.D.   On: 08/17/2019 20:47  DG Chest 2 View  Result Date: 08/14/2019 CLINICAL DATA:  Shortness of breath EXAM: CHEST - 2 VIEW COMPARISON:  08/11/2019 FINDINGS: Cardiomegaly. Persistent atelectasis or consolidation of the medial right lung base. The visualized skeletal structures are unremarkable. IMPRESSION: 1. Persistent atelectasis or consolidation of the medial right lung base. No new airspace opacity. 2.  Cardiomegaly. Electronically  Signed   By: Eddie Candle M.D.   On: 08/14/2019 12:51   US PELVIS LIMITED (TRANSABDOMINAL ONLY)  Result Date: 08/17/2019 CLINICAL DATA:  Gross hematuria EXAM: LIMITED ULTRASOUND OF PELVIS TECHNIQUE: Limited transabdominal ultrasound examination of the pelvis was performed. COMPARISON:  Ultrasound dated 08/13/2019 FINDINGS: The Foley catheter bulb appears to be well position within the urinary bladder. There is diffuse bladder wall thickening. There is a small amount of debris adjacent to the distal aspect of the Foley catheter. IMPRESSION: 1. Foley catheter appears to be position within the urinary bladder. 2. Persistent diffuse bladder wall thickening. 3. Complex debris about the distal aspect of the Foley catheter may represent a small amount of blood products. No large intraluminal blood clot identified on this ultrasound. Electronically Signed   By: Constance Holster M.D.   On: 08/17/2019 16:30   US PELVIS LIMITED (TRANSABDOMINAL ONLY)  Result Date: 08/13/2019 CLINICAL DATA:  Gross hematuria. EXAM: LIMITED ULTRASOUND OF PELVIS TECHNIQUE: Limited transabdominal ultrasound examination of the pelvis was performed. COMPARISON:  CT dated 08/13/2019 FINDINGS: The bladder is decompressed with a Foley catheter in place. Foley catheter bulb appears to be well position within the urinary bladder. The bladder wall is diffusely thickened. Prostate gland is mildly enlarged. No clear intraluminal filling defects are noted within the urinary bladder that would be concerning for blood clots. IMPRESSION: 1. The urinary bladder is decompressed by Foley catheter. The Foley catheter bulb appears well position within the urinary bladder. 2. Diffuse bladder wall thickening is again noted. No obvious blood clots are noted within the urinary bladder. Electronically Signed   By: Constance Holster M.D.   On: 08/13/2019 18:12   CARDIAC CATHETERIZATION  Addendum Date: 08/29/2019    Prox LAD to Mid LAD lesion is 75% stenosed  with 95% stenosed side branch in 1st Diag.  Overall atherectomy was performed on the proximal to mid LAD lesions.  Scoring balloon angioplasty was performed on the ostium of the side branch, using a BALLOON WOLVERINE 2.50X10.  A drug-eluting stent was successfully placed from the LAD into the sidebranch using a SYNERGY XD 2.75X16. (Reverse Culotte technique) -> postdilated to 2.9 mm  Post intervention, the side branch was reduced to 20% residual stenosis.  Lat 1st Diag lesion is 95% stenosed -likely related to distal wire related spasm, following intracoronary verapamil: Post intervention, there is a 45% residual stenosis.  A drug-eluting stent was successfully placed from proximal LAD through the sidebranch stent (after kissing balloon and POT preparation) using a SYNERGY XD 3.0X38. -Postdilated proximally using final POT to 3.6 mm and distally to 3.1 mm  Post intervention, there is a 0% residual stenosis.  Mid LAD to Dist LAD distal edge dissection noted after the initial stent.  A second drug-eluting stent was successfully placed overlapping the original stent distally, using a SYNERGY XD 3.0X16. Postdilated to 3.1 mm.  A third distal overlapping drug-eluting stent was successfully placed using a SYNERGY XD 2.75X16. Postdilated distally to 2.8 mm and proximal to 3.1 mm.  Post intervention, there is a 0% residual stenosis.  After initial stent placement in the LAD, there was likely distalization and  distal wire dissection revealing: Dist LAD lesion is 100% stenosed.  Balloon angioplasty was performed using a BALLOON SAPPHIRE 2.0X15.  Post intervention, there is a 20% residual stenosis with restoration of TIMI-3 flow to the apex.  SUMMARY  Successful bifurcation PCI with orbital atherectomy based DES PCI of LAD, and sidebranch scoring balloon angioplasty and DES stenting of 1st Diag in a Reverse Culotte technique.  Coverage of distal edge dissection in LAD with 2 overlapping downstream stents and  apical PTCA.  Minor distal branch of D1 very distal wire dissection reperfusing at end of PCI.  Unable to perform final kissing balloons due to angulation of sidebranch and inability to pass a balloon through overlapping stent. RECOMMENDATIONS  Monitor overnight and continue home medications.  Follow-up as scheduled  Continue to titrate goal-directed medical therapy (GDMT) medications as blood pressure tolerates.  Anticipate reassessing EF in roughly 3 months with medical optimization and complete revascularization now done. [name], M.D., M.S. Interventional Cardiologist Pager # [phone] Phone # [phone] [address] [place], [place] [address]   Result Date: 08/29/2019  Prox LAD to Mid LAD lesion is 75% stenosed with 95% stenosed side branch in 1st Diag.  Overall atherectomy was performed on the proximal to mid LAD lesions.  Scoring balloon angioplasty was performed on the ostium of the side branch, using a BALLOON WOLVERINE 2.50X10.  A drug-eluting stent was successfully placed from the LAD into the sidebranch using a SYNERGY XD 2.75X16. (Reverse Culotte technique) -> postdilated to 2.9 mm  Post intervention, the side branch was reduced to 20% residual stenosis.  Lat 1st Diag lesion is 95% stenosed -likely related to distal wire related spasm, following intracoronary verapamil: Post intervention, there is a 45% residual stenosis.  A drug-eluting stent was successfully placed from proximal LAD through the sidebranch stent (after kissing balloon and POT preparation) using a SYNERGY XD 3.0X38. -Postdilated proximally using final POT to 3.6 mm and distally to 3.1 mm  Post intervention, there is a 0% residual stenosis.  Mid LAD to Dist LAD distal edge dissection noted after the initial stent.  A second drug-eluting stent was successfully placed overlapping the original stent distally, using a SYNERGY XD 3.0X16. Postdilated to 3.1 mm.  A third distal overlapping drug-eluting  stent was successfully placed using a SYNERGY XD 2.75X16. Postdilated distally to 2.8 mm and proximal to 3.1 mm.  Post intervention, there is a 0% residual stenosis.  After initial stent placement in the LAD, there was likely distalization and distal wire dissection revealing: Dist LAD lesion is 100% stenosed.  Balloon angioplasty was performed using a BALLOON SAPPHIRE 2.0X15.  Post intervention, there is a 20% residual stenosis with restoration of TIMI-3 flow to the apex.    CARDIAC CATHETERIZATION  Result Date: 08/09/2019  Prox RCA lesion is 80% stenosed.  Ost RCA to Prox RCA lesion is 50% stenosed.  1st Diag lesion is 95% stenosed.  Prox LAD to Mid LAD lesion is 75% stenosed.  3rd Mrg lesion is 100% stenosed.  Mid Cx to Dist Cx lesion is 80% stenosed.  A stent was successfully placed.  Post intervention, there is a 0% residual stenosis.  A stent was successfully placed.  Post intervention, there is a 0% residual stenosis.  A drug-eluting stent was successfully placed.  Post intervention, there is a 0% residual stenosis.  Post intervention, there is a 0% residual stenosis.  John Love is a 69 y.o. male  [id number] LOCATION:  FACILITY: [facility] PHYSICIAN: Quay Burow, M.D. [birth year]  DATE OF PROCEDURE:  08/09/2019 DATE OF DISCHARGE: CARDIAC CATHETERIZATION / PCI DES LCX/RCA/ RHC History obtained from chart review.  Mr. Sheard is a 69 year old moderately overweight African-American gentleman without prior cardiac history who apparently had his Covid vaccine shot early evening and had witnessed cardiac arrest after that with ventricular fibrillation.  He had CPR defibrillation and ROSC and approxi-20 to 25 minutes with spontaneous movement.  He was combative, was intubated and sedated.  His EKG showed lateral ST segment depression with slight ST segment elevation in lead aVR.  He was evaluated by Dr. Ellyn Hack in the ER who felt that he should be brought up for emergent cardiac catheterization.   Initially his blood pressure was in the systolic 80 range and he was placed on IV Levophed. PROCEDURE DESCRIPTION: The patient was brought to the second floor [facility] cath lab in the postabsorptive state. He was premedicated with IV Versed, fentanyl and propofol.. His right groin was prepped and shaved in usual sterile fashion. Xylocaine 1% was used for local anesthesia. A 6 French sheath was inserted into the right common femoral artery using standard Seldinger technique.  An 8 French sheath was inserted into the right common femoral vein.  5 French right left second Silastic catheters on 5 French pigtail catheter used for selective coronary angiography, and left ventriculography using "a hand shot with obtaining left heart pressures.  Isovue dye is used for the entirety of the case.  Retrograde aorta, ventricular pullback pressures were recorded.  A VIP 7.5 French Swan-Ganz catheter was carefully maneuvered into the pulmonary artery obtaining sequential pressures. The circumflex was most likely the "culprit lesion" and this was initially addressed.  It was decided to leave the LAD diagonal branch bifurcation alone since there was TIMI-3 flow and this was a heavy calcified vessel and to proceed with RCA intervention should the circumflex go without complication.  The patient received Angiomax bolus followed by infusion with a ACT of greater than 300.  Because I was unable to give ticagrelor p.o. or via OG because of uncertainty of placement at bolus the patient with Cangrelor and placed him on a Cangrelor drip.  Isovue dye was used for the entirety of the intervention.  Retroaortic pressures monitored in the case. The circumflex was initially addressed using a 6 French XB 3.5 cm guide catheter.  A 0.14 Prowater guidewire was used to cross the distal circumflex obtuse marginal branch lesion which was predilated with a 2 mm x 12 mm balloon.  There was a 60 to 70% lesion upstream from this.  I then placed a  2.25 x 28 mm long Synergy drug-eluting stent across both lesions and deployed at 16 atm.  I postdilated the proximal segment of the tube with a 3.5 mm x 24 mill meter long Synergy drug-eluting stent deploying at 16 atm.  I postdilated with a 4 mm x 15 mm noncompliant balloon but was unable to originally with a 2.7 0.5 x 12 mm long noncompliant balloon.  There was some spasm downstream which resolved with intracoronary glycerin and proximal postdilatation with excellent result. The patient was moderately hyper hypotensive during the procedure and received a 500 cc fluid bolus and went and had his Levophed infusion rate adjusted appropriately. I then addressed the RCA using a 6 [place] JR4 guide catheter, a 0.14 Prowater guidewire.  I direct stented this with a 3.5 mm x 24 mm long Synergy drug-eluting stent deployed at 16 atm.  I postdilated with a 4 mm x  15 mm balloon but was unable to pass the proximal struts and therefore used a second prowater for "buddy wire technique which allowed the post dilatation balloon to easily track into the stented segment post dilating the entire stent at 14 to 16 atm resulting reduction of an 80% proximal dominant RCA stenosis to 0% residual.  The patient tolerated procedure well.  He was sedated and ventilated throughout the case. The patient's LVEDP was 11 and his wedge was 15 suggesting that he was potentially mildly hypovolemic and did respond to a fluid bolus.   Successful mid to distal circumflex stenting which was the "culprit lesion in the setting of witnessed VF arrest post Covid vaccine administration.  I wonder whether this was an anaphylactic reaction creating hypotension and acute coronary artery thrombosis in the setting of three-vessel disease.  He has successful RCA intervention as well.  He will need staged LAD diagonal branch bifurcation intervention using orbital atherectomy given the calcific nature of the disease assuming that he neurologically recovers.  I  spoken to PCCM and they are aware of the patient and will manage his vent.  We will discuss whether he needs Arctic sun, systemic cooling. Quay Burow. MD, [facility] 08/09/2019 8:49 PM   DG CHEST PORT 1 VIEW  Result Date: 08/11/2019 CLINICAL DATA:  Congestive heart failure. Shortness of breath. EXAM: PORTABLE CHEST 1 VIEW COMPARISON:  08/10/2019 and 04/07/2020 FINDINGS: The endotracheal tube and Swan-Ganz catheter and NG tube have been removed. Heart size and pulmonary vascularity are normal. Bilateral pulmonary edema has resolved. There is persistent atelectasis or consolidation at the right lung base medially. Aortic atherosclerosis. No acute bone abnormality. IMPRESSION: Resolution of pulmonary edema. Persistent atelectasis or consolidation at the right lung base medially. Electronically Signed   By: Lorriane Shire M.D.   On: 08/11/2019 09:33   DG Chest Port 1 View  Result Date: 08/10/2019 CLINICAL DATA:  Respiratory failure. EXAM: PORTABLE CHEST 1 VIEW COMPARISON:  Chest x-ray 08/10/2019 FINDINGS: The endotracheal tube is 5.5 cm above the carina. The NG tube is coursing down the esophagus and into the stomach. The Swan-Ganz catheter tip is in the right pulmonary artery. The cardiac silhouette, mediastinal and hilar contours are stable. Persistent asymmetric interstitial and airspace process in the lungs but improved aeration since the prior study. No definite pleural effusions or pneumothorax. IMPRESSION: 1. Stable support apparatus. 2. Improving lung aeration but persistent asymmetric interstitial and airspace process. Electronically Signed   By: Marijo Sanes M.D.   On: 08/10/2019 09:46   Portable Chest xray  Result Date: 08/10/2019 CLINICAL DATA:  Status post catheterization, intubated EXAM: PORTABLE CHEST 1 VIEW COMPARISON:  08/09/2019 FINDINGS: Two frontal views of the chest demonstrates endotracheal tube overlying tracheal air column tip at level of thoracic inlet. Enteric catheter coils over the  gastric antrum. Central venous catheter from an inferior approach, wedged within the periphery of the right pulmonary artery. There is central vascular congestion. Consolidation at the right lung base with associated right-sided volume loss compatible with atelectasis. There may be a small right pleural effusion. No pneumothorax. IMPRESSION: 1. Right-sided volume loss with right basilar consolidation, favor atelectasis. 2. Central vascular congestion. 3. Support devices as above, with flow directed central venous catheter wedged within the periphery of the right pulmonary artery. Electronically Signed   By: Randa Ngo M.D.   On: 08/10/2019 00:50   DG Chest Port 1 View  Result Date: 08/09/2019 CLINICAL DATA:  69 year old male status post CPR. EXAM: PORTABLE CHEST 1  VIEW COMPARISON:  None. FINDINGS: Endotracheal tube with tip approximately 6 cm above the carina. Enteric tube extends below the diaphragm with tip beyond the inferior margin of the image. There is cardiomegaly with vascular congestion. No consolidative changes. There is no pleural effusion or pneumothorax. No acute osseous pathology. IMPRESSION: 1. Endotracheal tube above the carina. 2. Cardiomegaly with vascular congestion. Electronically Signed   By: Anner Crete M.D.   On: 08/09/2019 18:53   ECHOCARDIOGRAM COMPLETE  Result Date: 08/10/2019    ECHOCARDIOGRAM REPORT   Patient Name:   John Love Date of Exam: 08/10/2019 Medical Rec #:  [id number]       Height:       72.0 in Accession #:    [id number]      Weight:       224.9 lb Date of Birth:  1950/09/22      BSA:          2.239 m Patient Age:    72 years        BP:           142/72 mmHg Patient Gender: M               HR:           90 bpm. Exam Location:  Inpatient Procedure: 2D Echo, Cardiac Doppler and Color Doppler Indications:    I49.9* Cardiac arrhythmia, unspecified. Cardiac arrest.  History:        Patient has no prior history of Echocardiogram examinations.                  Arrythmias:Cardiac Arrest and Ventricular Fibrillation. Cardiac                 shock. Respiratory failure.  Sonographer:    Roseanna Rainbow RDCS Referring Phys: [id number] [facility] P DES[facility]  Sonographer Comments: Technically difficult study due to poor echo windows, no parasternal window, suboptimal parasternal window, suboptimal apical window and echo performed with patient supine and on artificial respirator. IMPRESSIONS  1. Left ventricular ejection fraction, by estimation, is 55 to 60%. The left ventricle has normal function. The left ventricle demonstrates regional wall motion abnormalities (see scoring diagram/findings for description). There is mild concentric left ventricular hypertrophy. Left ventricular diastolic parameters are consistent with Grade I diastolic dysfunction (impaired relaxation).  2. Right ventricular systolic function is normal. The right ventricular size is normal.  3. The mitral valve is grossly normal. No evidence of mitral valve regurgitation. No evidence of mitral stenosis.  4. The aortic valve is grossly normal. Aortic valve regurgitation is not visualized. No aortic stenosis is present. Comparison(s): No prior Echocardiogram. FINDINGS  Left Ventricle: Left ventricular ejection fraction, by estimation, is 55 to 60%. The left ventricle has normal function. The left ventricle demonstrates regional wall motion abnormalities. The left ventricular internal cavity size was normal in size. There is mild concentric left ventricular hypertrophy. Left ventricular diastolic parameters are consistent with Grade I diastolic dysfunction (impaired relaxation). Normal left ventricular filling pressure.  LV Wall Scoring: The posterior wall is hypokinetic. Right Ventricle: The right ventricular size is normal. No increase in right ventricular wall thickness. Right ventricular systolic function is normal. Left Atrium: Left atrial size was normal in size. Right Atrium: Right atrial size was normal in size.  Pericardium: There is no evidence of pericardial effusion. Mitral Valve: The mitral valve is grossly normal. No evidence of mitral valve regurgitation. No evidence of mitral valve stenosis. Tricuspid Valve: The tricuspid valve is  grossly normal. Tricuspid valve regurgitation is trivial. Aortic Valve: The aortic valve is grossly normal. Aortic valve regurgitation is not visualized. No aortic stenosis is present. Pulmonic Valve: The pulmonic valve was grossly normal. Pulmonic valve regurgitation is not visualized. Aorta: The aortic root is normal in size and structure. IAS/Shunts: No atrial level shunt detected by color flow Doppler. Additional Comments: A venous catheter is visualized in the right atrium and right ventricle.  LEFT VENTRICLE PLAX 2D LVIDd:         4.15 cm     Diastology LVIDs:         3.00 cm     LV e' lateral:   6.20 cm/s LV PW:         1.50 cm     LV E/e' lateral: 11.0 LV IVS:        1.45 cm     LV e' medial:    4.57 cm/s LVOT diam:     2.40 cm     LV E/e' medial:  14.9 LV SV:         59 LV SV Index:   26 LVOT Area:     4.52 cm  LV Volumes (MOD) LV vol d, MOD A2C: 47.8 ml LV vol d, MOD A4C: 65.0 ml LV vol s, MOD A2C: 25.2 ml LV vol s, MOD A4C: 28.9 ml LV SV MOD A2C:     22.6 ml LV SV MOD A4C:     65.0 ml LV SV MOD BP:      29.3 ml RIGHT VENTRICLE             IVC RV S prime:     14.00 cm/s  IVC diam: 1.50 cm TAPSE (M-mode): 1.2 cm LEFT ATRIUM           Index LA diam:      2.60 cm 1.16 cm/m LA Vol (A2C): 21.9 ml 9.78 ml/m LA Vol (A4C): 39.3 ml 17.55 ml/m  AORTIC VALVE LVOT Vmax:   93.80 cm/s LVOT Vmean:  59.600 cm/s LVOT VTI:    0.131 m  AORTA Ao Root diam: 2.70 cm MITRAL VALVE MV Area (PHT): 2.66 cm    SHUNTS MV Decel Time: 285 msec    Systemic VTI:  0.13 m MV E velocity: 67.90 cm/s  Systemic Diam: 2.40 cm MV A velocity: 75.20 cm/s MV E/A ratio:  0.90 Eleonore Chiquito MD Electronically signed by Eleonore Chiquito MD Signature Date/Time: 08/10/2019/1:33:50 PM    Final    Disposition   Pt is being  discharged home today in good condition.  Follow-up Plans & Appointments    Follow-up Information    Lendon Colonel, NP Follow up on 09/12/2019.   Specialties: Nurse Practitioner, Radiology, Cardiology Why: at 11:15am for your follow up appt.  Contact information: [address] [place] [address] [phone]          Discharge Instructions    Amb Referral to Cardiac Rehabilitation   Complete by: As directed    Will send Cardiac Phase 2 referral to [place]   Diagnosis: Coronary Stents   After initial evaluation and assessments completed: Virtual Based Care may be provided alone or in conjunction with Phase 2 Cardiac Rehab based on patient barriers.: Yes       Discharge Medications     Medication List    TAKE these medications   acetaminophen 500 MG tablet Commonly known as: TYLENOL Take 1,000 mg by mouth every 6 (six) hours as needed  for moderate pain or headache.   aspirin EC 81 MG tablet Take 81 mg by mouth daily.   atorvastatin 80 MG tablet Commonly known as: LIPITOR Take 1 tablet (80 mg total) by mouth daily at 6 PM.   carvedilol 25 MG tablet Commonly known as: COREG Take 1 tablet (25 mg total) by mouth 2 (two) times daily with a meal.   MULTIVITAMIN ADULT PO Take 2 tablets by mouth daily.   ramipril 2.5 MG capsule Commonly known as: ALTACE Take 1 capsule (2.5 mg total) by mouth daily.   tamsulosin 0.4 MG Caps capsule Commonly known as: FLOMAX Take 1 capsule (0.4 mg total) by mouth daily after supper. What changed: when to take this   ticagrelor 90 MG Tabs tablet Commonly known as: BRILINTA Take 1 tablet (90 mg total) by mouth 2 (two) times daily.        No                               Did the patient have a percutaneous coronary intervention (stent / angioplasty)?:  Yes.     Cath/PCI Registry Performance & Quality Measures: 1. Aspirin prescribed? - Yes 2. ADP Receptor Inhibitor (Plavix/Clopidogrel,  Brilinta/Ticagrelor or Effient/Prasugrel) prescribed (includes medically managed patients)? - Yes 3. High Intensity Statin (Lipitor 40-64m or Crestor 20-486m prescribed? - Yes 4. For EF <40%, was ACEI/ARB prescribed? - Yes 5. For EF <40%, Aldosterone Antagonist (Spironolactone or Eplerenone) prescribed? - Not Applicable (EF >/= 4062%6. Cardiac Rehab Phase II ordered (Included Medically managed Patients)? - Yes  Outstanding Labs/Studies   N/a   Duration of Discharge Encounter   Greater than 30 minutes including physician time.  Signed, LiReino BellisP-C 08/30/2019, 9:39 AM   I have examined the patient and reviewed assessment and plan and discussed with patient.  Agree with above as stated.    GEN: Well nourished, well developed, in no acute distress  HEENT: normal  Neck: no JVD, carotid bruits, or masses Cardiac: RRR; no murmurs, rubs, or gallops,no edema  Respiratory:  clear to auscultation bilaterally, normal work of breathing GI: soft, nontender, nondistended,  MS: no deformity or atrophy , no hematoma; palpable right radial pulse Skin: warm and dry, no rash Neuro:  Strength and sensation are intact Psych: euthymic mood, full affect  Encouraged DAPT with aggressive secondary prevention.  We also discussed the blood pressure range that he should shoot for of [address]-950ystolic.  He will also see that his heart rate stays above 50.    Follow-up as noted above.  JaLarae Grooms

## 2019-08-30 NOTE — Progress Notes (Signed)
MD on call Dr.Turner made aware &ordered to hold Coreg until seen by MD

## 2019-08-30 NOTE — Progress Notes (Signed)
CARDIAC REHAB PHASE I   PRE:  Rate/Rhythm: 74 SR  BP:  Sitting: 125/70      SaO2: 98 RA  MODE:  Ambulation: 350 ft   POST:  Rate/Rhythm: 94 SR  BP:  Sitting: 132/68    SaO2: 98 RA  Pt ambulated 367ft in hallway standby assist with steady gait. Pt denies CP or SOB. When pt returned to room and sat, pt had a brief episode of HR in 150s. Pt asymptomatic, NP made aware. Reinforced importance of ASA and Brilinta. Reviewed site care, restrictions, and exercise guidelines. Will refer to CRP II [place].  [id number] Rufina Falco, RN BSN 08/30/2019 8:36 AM

## 2019-08-31 ENCOUNTER — Encounter: Payer: Self-pay | Admitting: *Deleted

## 2019-09-05 DIAGNOSIS — R972 Elevated prostate specific antigen [PSA]: Secondary | ICD-10-CM | POA: Diagnosis not present

## 2019-09-05 DIAGNOSIS — D509 Iron deficiency anemia, unspecified: Secondary | ICD-10-CM | POA: Diagnosis not present

## 2019-09-05 DIAGNOSIS — I10 Essential (primary) hypertension: Secondary | ICD-10-CM | POA: Diagnosis not present

## 2019-09-05 DIAGNOSIS — E7800 Pure hypercholesterolemia, unspecified: Secondary | ICD-10-CM | POA: Diagnosis not present

## 2019-09-10 NOTE — Progress Notes (Signed)
Cardiology Office Note   Date:  09/12/2019   ID:  John, Love 1951-04-12, MRN [id number]  PCP:  Patient, No Pcp Per  Cardiologist:  Dr.Harding  No chief complaint on file.    History of Present Illness: John Love is a 69 y.o. male who presents for post hospital follow up after discharge with known history of CAD, He has extensive CAD per recent cardiac cath.  Per cardiac cath he had 80% lesion of the proximal RCA 80% stenosed, ostial to proximal RCA lesion 50% stenosed, 1st diagonal lesion 95% stenosed, proximal LAD to Mid LAD 75% stenosed, mid Cx to Distal Cx lesion 80% stenosed.  A stent was successfully placed to the RCA. He is to have staged LAD diagonal branch bifurcation intervention using orbital atherectomy, given the calcific nature of the disease.   He was readmitted for stage procedure on 08/29/2019, to the LAD/diagonal due to 75% stenosis of the proximal to mid LAD and 95% stenosed side branch. He has successful bifurcation PCI with orbital arthrectomy based DES PCI of the LAD and branch scoring balloon angioplasty and DES stenting of the 1st diagonal in a reverse Culotte technique. Coverage of distal edge dissection in LAD with 2 overlapping downstream stents and apical PTCA.Minor distal branch of D1 very distal wire dissection reperfusing at end of PCI Unable to perform final kissing balloons due to angulation of sidebranch and inability to pass a balloon through overlapping stent.  It was also noted that he had nocturnal bradycardia with atrial tachycardia during admission similar to previous admission. It is recommended that he have OP sleep study.    He comes today without any complaints with the exception of some soreness in his chest from after having had CPR.  He sometimes has a little bit of discomfort when he takes a deep breath and coughs.  He states he still coughing up some phlegm.  He denies any bleeding, recurrent chest pain, palpitations, or dyspnea.  He  is medically compliant.   Past Medical History:  Diagnosis Date  . Cardiac arrest with ventricular fibrillation (HCC) -> 20 minutes CPR with ROSC 08/09/2019  . Multiple vessel coronary artery disease 08/10/2019   Heart Cath -PCI 08/09/2019:  CULPRIT LESION: Mid Cx to Dist Cx lesion is 80% stenosed. 3rd Mrg lesion is 100% stenosed at small branch take-off. DES PCI: 2.25 mm x 28 mm Synergy DES (proximal postdilated 2.75 mm.  Post intervention, there is a 0% residual stenosis.  Small side branch was occluded Ost RCA to Prox RCA lesion is 50% stenosed.Prox RCA lesion is 80% stenosed.  DES PCI: 3.5 mm x 24 mm Sy    Past Surgical History:  Procedure Laterality Date  . CORONARY ATHERECTOMY N/A 08/29/2019   Procedure: CORONARY ATHERECTOMY;  Surgeon: Leonie Man, MD;  Location: [place];  Service: Cardiovascular;  Laterality: N/A;  . CORONARY STENT INTERVENTION N/A 08/09/2019   Procedure: CORONARY STENT INTERVENTION;  Surgeon: Lorretta Harp, MD;  Location: [place];  Service: Cardiovascular;  Laterality: N/A;  . CORONARY STENT INTERVENTION N/A 08/29/2019   Procedure: CORONARY STENT INTERVENTION;  Surgeon: Leonie Man, MD;  Location: [facility] [facility];  Service: Cardiovascular;  Laterality: N/A;  . CYSTOSCOPY N/A 08/18/2019   Procedure: CYSTOSCOPY CLOT EVACUATION FULGURATION 0.5-2 CM.;  Surgeon: Festus Aloe, MD;  Location: [facility];  Service: Urology;  Laterality: N/A;  . LEFT HEART CATH AND CORONARY ANGIOGRAPHY N/A 08/09/2019   Procedure: LEFT HEART CATH  AND CORONARY ANGIOGRAPHY;  Surgeon: Lorretta Harp, MD;  Location: [facility];  Service: Cardiovascular;  Laterality: N/A;     Current Outpatient Medications  Medication Sig Dispense Refill  . acetaminophen (TYLENOL) 500 MG tablet Take 1,000 mg by mouth every 6 (six) hours as needed for moderate pain or headache.    [name] aspirin EC 81 MG tablet Take 81 mg by mouth daily.    [name] atorvastatin (LIPITOR) 80 MG  tablet Take 1 tablet (80 mg total) by mouth daily at 6 PM. 90 tablet 3  . carvedilol (COREG) 25 MG tablet Take 1 tablet (25 mg total) by mouth 2 (two) times daily with a meal. 180 tablet 3  . Multiple Vitamin (MULTIVITAMIN ADULT PO) Take 2 tablets by mouth daily.    . ramipril (ALTACE) 2.5 MG capsule Take 1 capsule (2.5 mg total) by mouth daily. 90 capsule 3  . tamsulosin (FLOMAX) 0.4 MG CAPS capsule Take 1 capsule (0.4 mg total) by mouth daily after supper. (Patient taking differently: Take 0.4 mg by mouth daily. ) 30 capsule 6  . ticagrelor (BRILINTA) 90 MG TABS tablet Take 1 tablet (90 mg total) by mouth 2 (two) times daily. 180 tablet 3   No current facility-administered medications for this visit.    Allergies:   Patient has no known allergies.    Social History:  The patient  reports that he quit smoking about 4 weeks ago. His smoking use included cigarettes. He smoked 1.50 packs per day. He has never used smokeless tobacco. He reports current alcohol use of about 4.0 standard drinks of alcohol per week. He reports that he does not use drugs.   Family History:  The patient's family history includes Heart disease in his father.    ROS: All other systems are reviewed and negative. Unless otherwise mentioned in H&P    PHYSICAL EXAM: VS:  BP 116/68   Pulse 68   Ht 6' 1.5" (1.867 m)   Wt 199 lb 12.8 oz (90.6 kg)   BMI 26.00 kg/m  , BMI Body mass index is 26 kg/m. GEN: Well nourished, well developed, in no acute distress HEENT: normal Neck: no JVD, carotid bruits, or masses Cardiac:RRR; no murmurs, rubs, or gallops,no edema  Respiratory: Bilateral crackles cleared with cough. GI: soft, nontender, nondistended, + BS MS: no deformity or atrophy, right wrist catheter insertion site is well-healed without evidence of bleeding or hematoma. Skin: warm and dry, no rash Neuro:  Strength and sensation are intact Psych: euthymic mood, full affect   EKG: Personally reviewed: Normal  sinus rhythm heart rate of 68 bpm with nonspecific T wave abnormality in the lateral leads.  Recent Labs: 08/09/2019: ALT 71 08/10/2019: Magnesium 1.9 08/30/2019: BUN 12; Creatinine, Ser 0.95; Hemoglobin 9.5; Platelets 437; Potassium 3.7; Sodium 139    Lipid Panel    Component Value Date/Time   CHOL 127 08/09/2019 1807   TRIG 112 08/09/2019 2249   HDL 31 (L) 08/09/2019 1807   CHOLHDL 4.1 08/09/2019 1807   VLDL 18 08/09/2019 1807   LDLCALC 78 08/09/2019 1807      Wt Readings from Last 3 Encounters:  09/12/19 199 lb 12.8 oz (90.6 kg)  08/30/19 200 lb 1.6 oz (90.8 kg)  08/23/19 211 lb 9.6 oz (96 kg)      Other studies Reviewed: Cath: 08/29/19   Prox LAD to Mid LAD lesion is 75% stenosed with 95% stenosed side branch in 1st Diag.  Overall atherectomy was performed on the  proximal to mid LAD lesions.  Scoring balloon angioplasty was performed on the ostium of the side branch, using a BALLOON WOLVERINE 2.50X10.  A drug-eluting stent was successfully placed from the LAD into the sidebranch using a SYNERGY XD 2.75X16. (Reverse Culotte technique) -> postdilated to 2.9 mm  Post intervention, the side branch was reduced to 20% residual stenosis.  Lat 1st Diag lesion is 95% stenosed -likely related to distal wire related spasm, following intracoronary verapamil: Post intervention, there is a 45% residual stenosis.  A drug-eluting stent was successfully placed from proximal LAD through the sidebranch stent (after kissing balloon and POT preparation) using a SYNERGY XD 3.0X38. -Postdilated proximally using final POT to 3.6 mm and distally to 3.1 mm  Post intervention, there is a 0% residual stenosis.  Mid LAD to Dist LAD distal edge dissection noted after the initial stent.  A second drug-eluting stent was successfully placed overlapping the original stent distally, using a SYNERGY XD 3.0X16. Postdilated to 3.1 mm.  A third distal overlapping drug-eluting stent was successfully  placed using a SYNERGY XD 2.75X16. Postdilated distally to 2.8 mm and proximal to 3.1 mm.  Post intervention, there is a 0% residual stenosis.  After initial stent placement in the LAD, there was likely distalization and distal wire dissection revealing: Dist LAD lesion is 100% stenosed.  Balloon angioplasty was performed using a BALLOON SAPPHIRE 2.0X15.  Post intervention, there is a 20% residual stenosis with restoration of TIMI-3 flow to the apex.    ASSESSMENT AND PLAN:  1.  Coronary artery disease: Two-vessel.  Status post staged intervention to the proximal LAD and diagonal with PTCA to the distal LAD, and arthrectomy, by Dr. Ellyn Hack dated 08/29/2019.  He has tolerated the procedure well and offers no further complaints.  He has been in touch with cardiac rehab.  He will continue on Brilinta, aspirin, statin therapy,, carvedilol, and ramipril. He will need a CBC and a be met on follow-up.  He is also status post out-of-hospital cardiac arrest with a stent successfully placed to the right coronary artery in the middle of an emergent cath on 08/09/2019.  Above was a staged procedure planned by Dr. Ellyn Hack after recovery from initial procedure.   He is given a return to work note for October 14, 2019. He should begin cardiac rehab as soon as possible.  2. Hyperlipidemia: He remains on statin therapy will need fasting lipids and LFTs along with a be met on follow-up.  3. ED: Is asking if it would be okay for him to take Viagra. I have reviewed his medications and he is not on any nitrates. Nor does he have sublingual nitroglycerin to take as needed. I have advised him that it is okay for him to use Viagra as long as he is not concurrently using nitrates. As they are not on his medication list, and he confirms he is not taking them I think he will be okay.  Current medicines are reviewed at length with the patient today.  I have spent 25 minutes dedicated to the care of this patient on the date  of this encounter to include pre-visit review of records, assessment, management and diagnostic testing,with shared decision making.  Labs/ tests ordered today include: BMET, Lipids and LFTs, CBC.  Phill Myron. West Pugh, ANP, [facility]   09/12/2019 11:31 AM    [facility] [place] [address] Office [phone] Fax [phone]  Notice: This dictation was prepared with Dragon dictation along with  smaller phrase technology. Any transcriptional errors that result from this process are unintentional and may not be corrected upon review.

## 2019-09-12 ENCOUNTER — Other Ambulatory Visit: Payer: Self-pay

## 2019-09-12 ENCOUNTER — Ambulatory Visit: Payer: BC Managed Care – PPO | Admitting: Adult Health

## 2019-09-12 ENCOUNTER — Encounter: Payer: Self-pay | Admitting: Adult Health

## 2019-09-12 VITALS — BP 116/68 | HR 68 | Ht 73.5 in | Wt 199.8 lb

## 2019-09-12 DIAGNOSIS — Z79899 Other long term (current) drug therapy: Secondary | ICD-10-CM | POA: Diagnosis not present

## 2019-09-12 DIAGNOSIS — I10 Essential (primary) hypertension: Secondary | ICD-10-CM | POA: Diagnosis not present

## 2019-09-12 DIAGNOSIS — Z9861 Coronary angioplasty status: Secondary | ICD-10-CM

## 2019-09-12 DIAGNOSIS — N529 Male erectile dysfunction, unspecified: Secondary | ICD-10-CM

## 2019-09-12 DIAGNOSIS — I2510 Atherosclerotic heart disease of native coronary artery without angina pectoris: Secondary | ICD-10-CM

## 2019-09-12 DIAGNOSIS — E7800 Pure hypercholesterolemia, unspecified: Secondary | ICD-10-CM | POA: Diagnosis not present

## 2019-09-12 MED ORDER — TICAGRELOR 90 MG PO TABS: 90.0000 mg | ORAL_TABLET | Freq: Two times a day (BID) | ORAL | 3 refills | Status: DC

## 2019-09-12 MED ORDER — ATORVASTATIN CALCIUM 80 MG PO TABS: 80.0000 mg | ORAL_TABLET | Freq: Every day | ORAL | 3 refills | Status: DC

## 2019-09-12 MED ORDER — CARVEDILOL 25 MG PO TABS: 25.0000 mg | ORAL_TABLET | Freq: Two times a day (BID) | ORAL | 3 refills | Status: DC

## 2019-09-12 MED ORDER — RAMIPRIL 2.5 MG PO CAPS: 2.5000 mg | ORAL_CAPSULE | Freq: Every day | ORAL | 3 refills | Status: DC

## 2019-09-12 NOTE — Patient Instructions (Addendum)
Medication Instructions:  Continue current medications  *If you need a refill on your cardiac medications before your next appointment, please call your pharmacy*   Lab Work: CBC, BMP, Fasting Lipid and Liver in 3 Months  If you have labs (blood work) drawn today and your tests are completely normal, you will receive your results only by: [name] MyChart Message (if you have MyChart) OR . A paper copy in the mail If you have any lab test that is abnormal or we need to change your treatment, we will call you to review the results.   Testing/Procedures: None Ordered   Follow-Up: At [facility], you and your health needs are our priority.  As part of our continuing mission to provide you with exceptional heart care, we have created designated Provider Care Teams.  These Care Teams include your primary Cardiologist (physician) and Advanced Practice Providers (APPs -  Physician Assistants and Nurse Practitioners) who all work together to provide you with the care you need, when you need it.  We recommend signing up for the patient portal called "MyChart".  Sign up information is provided on this After Visit Summary.  MyChart is used to connect with patients for Virtual Visits (Telemedicine).  Patients are able to view lab/test results, encounter notes, upcoming appointments, etc.  Non-urgent messages can be sent to your provider as well.   To learn more about what you can do with MyChart, go to [email or web address].    Your next appointment:   3 month(s)  The format for your next appointment:   In Person  Provider:   You may see [name], MD or one of the following Advanced Practice Providers on your designated Care Team:    Rosaria Ferries, PA-C  Jory Sims, DNP, ANP  Cadence Kathlen Mody, NP

## 2019-09-13 DIAGNOSIS — B9620 Unspecified Escherichia coli [E. coli] as the cause of diseases classified elsewhere: Secondary | ICD-10-CM | POA: Diagnosis not present

## 2019-09-13 DIAGNOSIS — N390 Urinary tract infection, site not specified: Secondary | ICD-10-CM | POA: Diagnosis not present

## 2019-09-13 DIAGNOSIS — N401 Enlarged prostate with lower urinary tract symptoms: Secondary | ICD-10-CM | POA: Diagnosis not present

## 2019-09-13 DIAGNOSIS — R310 Gross hematuria: Secondary | ICD-10-CM | POA: Diagnosis not present

## 2019-09-13 DIAGNOSIS — R3914 Feeling of incomplete bladder emptying: Secondary | ICD-10-CM | POA: Diagnosis not present

## 2019-09-16 DIAGNOSIS — R05 Cough: Secondary | ICD-10-CM | POA: Diagnosis not present

## 2019-09-16 DIAGNOSIS — R042 Hemoptysis: Secondary | ICD-10-CM | POA: Diagnosis not present

## 2019-09-19 ENCOUNTER — Ambulatory Visit: Payer: BC Managed Care – PPO | Admitting: Cardiology

## 2019-10-07 DIAGNOSIS — R972 Elevated prostate specific antigen [PSA]: Secondary | ICD-10-CM | POA: Diagnosis not present

## 2019-10-07 DIAGNOSIS — I10 Essential (primary) hypertension: Secondary | ICD-10-CM | POA: Diagnosis not present

## 2019-10-07 DIAGNOSIS — D509 Iron deficiency anemia, unspecified: Secondary | ICD-10-CM | POA: Diagnosis not present

## 2019-10-07 DIAGNOSIS — R042 Hemoptysis: Secondary | ICD-10-CM | POA: Diagnosis not present

## 2019-10-13 ENCOUNTER — Telehealth: Payer: Self-pay | Admitting: Adult Health

## 2019-10-13 ENCOUNTER — Telehealth: Payer: Self-pay | Admitting: *Deleted

## 2019-10-13 NOTE — Telephone Encounter (Signed)
-----   Message from Lendon Colonel, NP sent at 10/13/2019  9:46 AM EDT ----- Regarding: Letter [name] needs an extension to allow him to go back to work on May 3 instead of April 29th. He has an echo scheduled on Friday and needs to be off for that. He can  have the extension. He will come by and pick it up at the front desk.   Thanks!

## 2019-10-13 NOTE — Telephone Encounter (Signed)
    STAT if patient feels like he/she is going to faint   1) Are you dizzy now? Only when he make sudden moves  2) Do you feel faint or have you passed out? No  3) Do you have any other symptoms? None  4) Have you checked your HR and BP (record if available)?he said both are normal  Pt said he is still having slight dizziness when he stand up and make a sudden move. He supposed to go back to work tomorrow or Monday, he wanted to know if it's safe for him to go back to work or need another week  Please call

## 2019-10-13 NOTE — Telephone Encounter (Signed)
Patient spoke to Jory Sims, NP today- not in epic.

## 2019-10-13 NOTE — Telephone Encounter (Signed)
Letter written placed at front desk for pt to pick up.

## 2019-10-13 NOTE — Telephone Encounter (Signed)
Mr. John Love called requesting an extension on his return to work date from October 13, 2019, to Oct 17, 2019. He has some follow up testing the next two days and would prefer to start on May 3. A new letter is being prepared. He will pick it up at the  Desk  Also reported am dizziness, but no chest pain or dyspnea. Goes away when he sits on the side of the bed for about 30 seconds.  I suggested that he increase fluids and possibly reduce the Flomax to 0.2 mg from 0.4 mg at HS.  If he continues to have issues, we will need to see him in the office for orthostatics.

## 2019-10-14 DIAGNOSIS — I2510 Atherosclerotic heart disease of native coronary artery without angina pectoris: Secondary | ICD-10-CM | POA: Diagnosis not present

## 2019-10-14 DIAGNOSIS — R0602 Shortness of breath: Secondary | ICD-10-CM | POA: Diagnosis not present

## 2019-10-14 DIAGNOSIS — I10 Essential (primary) hypertension: Secondary | ICD-10-CM | POA: Diagnosis not present

## 2019-10-21 DIAGNOSIS — N401 Enlarged prostate with lower urinary tract symptoms: Secondary | ICD-10-CM | POA: Diagnosis not present

## 2019-10-21 DIAGNOSIS — I2510 Atherosclerotic heart disease of native coronary artery without angina pectoris: Secondary | ICD-10-CM | POA: Diagnosis not present

## 2019-10-21 DIAGNOSIS — I10 Essential (primary) hypertension: Secondary | ICD-10-CM | POA: Diagnosis not present

## 2019-10-21 DIAGNOSIS — E7800 Pure hypercholesterolemia, unspecified: Secondary | ICD-10-CM | POA: Diagnosis not present

## 2019-11-07 DIAGNOSIS — I10 Essential (primary) hypertension: Secondary | ICD-10-CM | POA: Diagnosis not present

## 2019-11-07 DIAGNOSIS — N401 Enlarged prostate with lower urinary tract symptoms: Secondary | ICD-10-CM | POA: Diagnosis not present

## 2019-11-07 DIAGNOSIS — I2510 Atherosclerotic heart disease of native coronary artery without angina pectoris: Secondary | ICD-10-CM | POA: Diagnosis not present

## 2019-11-07 DIAGNOSIS — E7800 Pure hypercholesterolemia, unspecified: Secondary | ICD-10-CM | POA: Diagnosis not present

## 2019-11-21 ENCOUNTER — Ambulatory Visit (INDEPENDENT_AMBULATORY_CARE_PROVIDER_SITE_OTHER): Payer: BC Managed Care – PPO

## 2019-11-21 ENCOUNTER — Other Ambulatory Visit: Payer: Self-pay | Admitting: Internal Medicine

## 2019-11-21 ENCOUNTER — Ambulatory Visit: Payer: BC Managed Care – PPO | Admitting: Cardiology

## 2019-11-21 DIAGNOSIS — N453 Epididymo-orchitis: Secondary | ICD-10-CM | POA: Diagnosis not present

## 2019-11-21 DIAGNOSIS — I10 Essential (primary) hypertension: Secondary | ICD-10-CM | POA: Diagnosis not present

## 2019-11-21 DIAGNOSIS — I2510 Atherosclerotic heart disease of native coronary artery without angina pectoris: Secondary | ICD-10-CM | POA: Diagnosis not present

## 2019-11-21 DIAGNOSIS — N50811 Right testicular pain: Secondary | ICD-10-CM | POA: Diagnosis not present

## 2019-11-21 DIAGNOSIS — N433 Hydrocele, unspecified: Secondary | ICD-10-CM | POA: Diagnosis not present

## 2019-11-21 DIAGNOSIS — E7800 Pure hypercholesterolemia, unspecified: Secondary | ICD-10-CM | POA: Diagnosis not present

## 2019-11-28 DIAGNOSIS — I10 Essential (primary) hypertension: Secondary | ICD-10-CM | POA: Diagnosis not present

## 2019-11-28 DIAGNOSIS — N453 Epididymo-orchitis: Secondary | ICD-10-CM | POA: Diagnosis not present

## 2019-11-28 DIAGNOSIS — I2510 Atherosclerotic heart disease of native coronary artery without angina pectoris: Secondary | ICD-10-CM | POA: Diagnosis not present

## 2019-11-28 DIAGNOSIS — N433 Hydrocele, unspecified: Secondary | ICD-10-CM | POA: Diagnosis not present

## 2019-12-08 DIAGNOSIS — Z79899 Other long term (current) drug therapy: Secondary | ICD-10-CM | POA: Diagnosis not present

## 2019-12-08 DIAGNOSIS — Z9861 Coronary angioplasty status: Secondary | ICD-10-CM | POA: Diagnosis not present

## 2019-12-08 DIAGNOSIS — I2510 Atherosclerotic heart disease of native coronary artery without angina pectoris: Secondary | ICD-10-CM | POA: Diagnosis not present

## 2019-12-08 LAB — BASIC METABOLIC PANEL
BUN/Creatinine Ratio: 15 (ref 10–24)
BUN: 15 mg/dL (ref 8–27)
CO2: 24 mmol/L (ref 20–29)
Calcium: 9.7 mg/dL (ref 8.6–10.2)
Chloride: 103 mmol/L (ref 96–106)
Creatinine, Ser: 0.98 mg/dL (ref 0.76–1.27)
GFR calc Af Amer: 92 mL/min/{1.73_m2} (ref >59)
GFR calc non Af Amer: 79 mL/min/{1.73_m2} (ref >59)
Glucose: 91 mg/dL (ref 65–99)
Potassium: 5.2 mmol/L (ref 3.5–5.2)
Sodium: 142 mmol/L (ref 134–144)

## 2019-12-08 LAB — HEPATIC FUNCTION PANEL
ALT: 23 IU/L (ref 0–44)
AST: 20 IU/L (ref 0–40)
Albumin: 4.1 g/dL (ref 3.8–4.8)
Alkaline Phosphatase: 105 IU/L (ref 48–121)
Bilirubin Total: 0.4 mg/dL (ref 0.0–1.2)
Bilirubin, Direct: 0.12 mg/dL (ref 0.00–0.40)
Total Protein: 7.2 g/dL (ref 6.0–8.5)

## 2019-12-08 LAB — CBC
Hematocrit: 39.5 % (ref 37.5–51.0)
Hemoglobin: 12.7 g/dL — ABNORMAL LOW (ref 13.0–17.7)
MCH: 24.4 pg — ABNORMAL LOW (ref 26.6–33.0)
MCHC: 32.2 g/dL (ref 31.5–35.7)
MCV: 76 fL — ABNORMAL LOW (ref 79–97)
Platelets: 457 10*3/uL — ABNORMAL HIGH (ref 150–450)
RBC: 5.2 x10E6/uL (ref 4.14–5.80)
RDW: 15.5 % — ABNORMAL HIGH (ref 11.6–15.4)
WBC: 10 10*3/uL (ref 3.4–10.8)

## 2019-12-08 LAB — LIPID PANEL
Chol/HDL Ratio: 2.4 ratio (ref 0.0–5.0)
Cholesterol, Total: 80 mg/dL — ABNORMAL LOW (ref 100–199)
HDL: 34 mg/dL — ABNORMAL LOW (ref >39)
LDL Chol Calc (NIH): 33 mg/dL (ref 0–99)
Triglycerides: 49 mg/dL (ref 0–149)
VLDL Cholesterol Cal: 13 mg/dL (ref 5–40)

## 2019-12-12 ENCOUNTER — Ambulatory Visit: Payer: BC Managed Care – PPO | Admitting: Cardiology

## 2019-12-12 ENCOUNTER — Encounter: Payer: Self-pay | Admitting: Cardiology

## 2019-12-12 ENCOUNTER — Other Ambulatory Visit: Payer: Self-pay

## 2019-12-12 VITALS — BP 130/78 | HR 73 | Temp 97.1°F | Ht 74.0 in | Wt 199.2 lb

## 2019-12-12 DIAGNOSIS — I469 Cardiac arrest, cause unspecified: Secondary | ICD-10-CM

## 2019-12-12 DIAGNOSIS — Z9861 Coronary angioplasty status: Secondary | ICD-10-CM

## 2019-12-12 DIAGNOSIS — I10 Essential (primary) hypertension: Secondary | ICD-10-CM | POA: Diagnosis not present

## 2019-12-12 DIAGNOSIS — I2510 Atherosclerotic heart disease of native coronary artery without angina pectoris: Secondary | ICD-10-CM | POA: Diagnosis not present

## 2019-12-12 DIAGNOSIS — E785 Hyperlipidemia, unspecified: Secondary | ICD-10-CM | POA: Diagnosis not present

## 2019-12-12 DIAGNOSIS — N179 Acute kidney failure, unspecified: Secondary | ICD-10-CM

## 2019-12-12 DIAGNOSIS — I4901 Ventricular fibrillation: Secondary | ICD-10-CM

## 2019-12-12 MED ORDER — ATORVASTATIN CALCIUM 40 MG PO TABS: 40.0000 mg | ORAL_TABLET | Freq: Every day | ORAL | 3 refills | Status: DC

## 2019-12-12 NOTE — Patient Instructions (Addendum)
Medication Instructions:   Change   Atorvastatin 40 mg  Daily    If you need a refill on your cardiac medications before your next appointment, please call your pharmacy*   Lab Work: Lipids -- in Oct 2021 If you have labs (blood work) drawn today and your tests are completely normal, you will receive your results only by: [name] MyChart Message (if you have MyChart) OR . A paper copy in the mail If you have any lab test that is abnormal or we need to change your treatment, we will call you to review the results.   Testing/Procedures:  not needed   Follow-Up: At [facility], you and your health needs are our priority.  As part of our continuing mission to provide you with exceptional heart care, we have created designated Provider Care Teams.  These Care Teams include your primary Cardiologist (physician) and Advanced Practice Providers (APPs -  Physician Assistants and Nurse Practitioners) who all work together to provide you with the care you need, when you need it.  We recommend signing up for the patient portal called "MyChart".  Sign up information is provided on this After Visit Summary.  MyChart is used to connect with patients for Virtual Visits (Telemedicine).  Patients are able to view lab/test results, encounter notes, upcoming appointments, etc.  Non-urgent messages can be sent to your provider as well.   To learn more about what you can do with MyChart, go to [email or web address].    Your next appointment:   4 month(s)  The format for your next appointment:   In Person  Provider:   [name], MD   Other Instructions

## 2019-12-12 NOTE — Progress Notes (Signed)
Primary Care Provider: Patient, No Pcp Per Cardiologist: [name], MD Electrophysiologist: None  Clinic Note: Chief Complaint  John Love presents with  . Follow-up    No major complaints  . Coronary Artery Disease    Status post four-vessel PCI-no angina    HPI:    John Love is a 69 y.o. male with a PMH below who presents today for 103-month follow-up for multivessel CAD-four-vessel PCI.John Love  Recent Hospitalizations:   August 09, 2019: Admitted as s/p CARDIAC ARREST/INFERIOR LATERAL STEMI -> John Love was in the 15-minute observation period after his second COVID-19 vaccine and collapsed while sitting in the chair.  The observing paramedic initiated CPR.  He was found to be in V. fib upon AED placement.  Unsuccessful defibrillation with AED, total of 6 shocks as well as amiodarone infusion prior to ROSC.  However upon ROSC the John Love did have some purposeful movements.  He was brought into the emergency room for additional stabilization with [facility] airway placement for ETT --> stable findings after ROSC showed extensive ST depressions in V4 through 6 as well as elevations in aVR.  With this presentation, he was taken to the cardiac catheterization lab for urgent cath and found to have severe three-vessel disease-had PCI of culprit lesion in the LCx followed by PCI of proximal RCA lesion based on wall motion normality on LV gram.  He had existing severe bifurcation LAD-diagonal disease with plans to perform staged atherectomy PCI if he were to recover. -->  Based on the fact that he had follow commands upon arrival to the ICU, he was not placed on Arctic sun.  He was temporarily on pressors but he continued off.  Echo showed preserved EF. -->  Believed to have been a vagal response to the vaccine with subsequent hypotension and potential plaque rupture leading to global cardiac ischemia with existing multivessel disease and subsequent plaque rupture.  Had intermittent bursts of PAT as well  as nocturnal bradycardia (and vagally mediated nocturnal AV node block) started on beta-blocker (and the possibility of atrial flutter, but not placed on John Love).  He was treated for aspiration pneumonia.   Initially seen for hospital follow-up March 9 by John Sims, NP: Feeling of the chest soreness from CPR compressions.  But no anginal chest pain.  Just some congestion.  Has had productive cough from his pneumonia.  Some mild residual exertional dyspnea -> scheduled for staged atherectomy bifurcation PCI of LAD-diagonal  August 29, 2018-staged bifurcation CSI atherectomy PCI of LAD-D1 (scoring balloon angioplasty of D1 ostium) stenting in REVERSE CULOTTE TECHNIQUE-> overnight monitoring and discharged home  John Love was last seen for second follow-up by John Love on September 12, 2019 still noting some soreness after CPR but no angina.  Worse with deep breath and coughing but the pneumonia cough is clearing up.  No palpitations or significant dyspnea.  Referred for cardiac rehab.,  Discussed issues with using Viagra (okay as long as not using nitroglycerin    Reviewed  CV studies:    The following studies were reviewed today: (if available, images/films reviewed: From Epic Chart or Care Everywhere) --> cath diagrams below. . 08/09/2019-Inf STEMI R&L CATH - 2V PCI: Culprit lesion ~100% OM3 (with 80% main LCx) --> DES PCI covering both lesions-Synergy DES 2.25 mm x 28 mm, postdilated to 2.75 mm; LESION #2 proximal RCA 50% followed by 80% --> DES PCI covering both-Synergy DES 3.5 mm x 24 mm, postdilated to 4.1 mm; LESION #3 p-mLAD calcified 75% lesion  involving ostial D1 95% eccentric calcified lesion -> plan staged atherectomy PCI. ;  LVEDP 11 mmHg, PCWP 15 mmHg.  08/10/2019, TTE: EF 55 to 60%.  No R WMA.  GR 1 DD.  Normal RV.  Normal valves.  08/29/2019, LAD CSI ATHERECTOMY/D1 SCORING BALLOON ANGIOPLASTY  & BIFURCATION STENTING    Scoring balloon PTCA of ostial D1 performed  followed by extensive CSI atherectomy of the LAD --> Bifurcation PCI using Reverse Culotte Technique with kissing balloon (4 total stents, initial stent from LAD into D1 with 3 overlapping stents in LAD)-> DES PCI LAD- D1 (Synergy DES 2.75 x 16 mm--2.9 mm); p-mLAD (through D1 proximal struts) Synergy DES 3.0 x 38 (POT dilated tapered fashion from 3.6 mm to 3.1 mm, unable to rewire D1 branch for final kissing balloon, due to acute angle) -> distal overlapping Synergy DES 3.0 x 16 placed for possible edge dissection followed by a second overlapping Synergy DES 2.75 mm x 16 mm for more distal lesion/extended dissection.  PTA of apical LAD using 2.0 mm balloon because of either distal embolization or wire dissection.      Interval History:   John Love is here today overall doing pretty well.  He is changing his PCP to [place] practice [place].  He has been seen at PCP recently for bronchitis/pneumonia.  He started to clear up now from a recent bout of pneumonia and is starting to get back into his activity.  He is exercising routinely, but is not doing cardiac rehab.  He does some strength conditioning exercises along with walking about 2-1/2 miles a day most days.  Now that his pneumonia is clearly static getting back into doing that.  Thankfully, he has not had any episodes whatsoever of chest tightness pressure with rest or exertion.  No heart failure symptoms of PND, orthopnea or edema.  He has not had any further arrhythmias either with rapid heart rates or palpitations.  No syncope or near syncope.  He did not have any bleeding issues on his antiplatelet agents.  No myalgias on statin.  Overall he is very thankful for however then turned out.  Just a little bit blown away by the fact that all this started with his COVID-19 vaccine.  CV Review of Symptoms (Summary) Cardiovascular ROS: no chest pain or dyspnea on exertion positive for - He does still have a little bit of exercise  intolerance, sometimes has to stop to catch his breath, but he has been dealing with a recent pneumonia.  Overall he does not really have any complaints negative for - edema, irregular heartbeat, orthopnea, palpitations, paroxysmal nocturnal dyspnea, rapid heart rate, shortness of breath or Syncope/near syncope or TIA/amaurosis fugax, claudication; lightheadedness, dizziness or wooziness.;  Melena, hematochezia or hematuria.  The John Love does not have symptoms concerning for COVID-19 infection (fever, chills, cough, or new shortness of breath).  The John Love is practicing social distancing & Masking.   He completed his COVID-19 vaccine injections on February 23    REVIEWED OF SYSTEMS   Review of Systems  Constitutional: Negative for malaise/fatigue (Gradually getting his energy level back).  HENT: Negative for nosebleeds.   Respiratory: Positive for sputum production (Less productive now than in the beginning of having his pneumonia.), shortness of breath (With pneumonia, but now better) and wheezing (Clearing up as his pneumonia clears up).   Cardiovascular: Negative for claudication and leg swelling.  Gastrointestinal: Negative for abdominal pain, constipation and heartburn.  Musculoskeletal: Negative for myalgias.  Neurological: Negative for dizziness, focal weakness and headaches.  Psychiatric/Behavioral: Negative for depression. The John Love is not nervous/anxious.      I have reviewed and (if needed) personally updated the John Love's problem list, medications, allergies, past medical and surgical history, social and family history.   PAST MEDICAL HISTORY   Past Medical History:  Diagnosis Date  . CAD S/P 4V PCI 08/10/2019   08/09/19: CULPRIT: m-dLCx 80%--OM2 100% @ small branch take-off. --> DES PCI: 2.25 mm x 28 mm Synergy DES (2.75 mm);  pRCA 50%-80% tandem lesions --> DES PCI: 3.5 mm x 24 mm Synergy DES (4.1 mm) 08/29/19: Staged Bifurcation PCI of p-dLAD@D1  with CSI atherectomy and  scoring low PTA of ostial D1-  (4 total stents, initial stent from LAD into D1 (Synergy 2.75 x 16-2.9 mm, with 3 overlapping stents in L  . Cardiac arrest with ventricular fibrillation (HCC) -> 20 minutes CPR with ROSC 08/09/2019   15 minutes following second COVID-19 vaccination -> vasovagal episode during the hypotension followed by VF arrest; found to have 4 vessel CAD on cath with culprit 100% OM 3--now status post four-vessel PCI  . Multiple vessel coronary artery disease 08/10/2019   Heart Cath -PCI 08/09/2019:  CULPRIT LESION: Mid Cx to Dist Cx lesion is 80% stenosed. 3rd Mrg lesion is 100% stenosed at small branch take-off. DES PCI: 2.25 mm x 28 mm Synergy DES (proximal postdilated 2.75 mm.  Post intervention, there is a 0% residual stenosis.  Small side branch was occluded Ost RCA to Prox RCA lesion is 50% stenosed.Prox RCA lesion is 80% stenosed.  DES PCI: 3.5 mm x 24 mm Sy  . Respiratory failure ([place])   . Sustained ventricular fibrillation ([place]) 08/09/2019   In setting of acute MI    PAST SURGICAL HISTORY   Past Surgical History:  Procedure Laterality Date  . CORONARY ATHERECTOMY N/A 08/29/2019   Procedure: CORONARY ATHERECTOMY;  Surgeon: Leonie Man, MD;  Location: [place] [facility];  Service: Cardiovascular; SCORING BALLOON ANGIOPLASTY- ostial D1, CSI ATHERECTOMY p-mLAD -> followed by bifurcation stenting  . CORONARY STENT INTERVENTION N/A 08/09/2019   Procedure: CORONARY STENT INTERVENTION;  Surgeon: Lorretta Harp, MD::  2V PCI: Culprit lesion ~100% OM3 (with 80% main LCx) --> DES PCI covering both lesions-Synergy DES 2.25 mm x 28 mm, postdilated to 2.75 mm; LESION #2 proximal RCA 50% followed by 80% --> DES PCI covering both-Synergy DES 3.5 mm x 24 mm  . CORONARY STENT INTERVENTION N/A 08/29/2019   Procedure: CORONARY STENT INTERVENTION;  Surgeon: Leonie Man, MD:: Bifurcation DES PCI, Reverse Culotte Technique W/ KB (4 total SYNERGY DES stents, 1st (2.75X16 - 2.9 MM) -  pLAD-ostD1 & 3 overlapping in LAD (3.0 x 38, 3.0 x 16, 2.75 x16 - tapered POT postdilation 3.6-3.1 & apical LAD PTA only.  . CYSTOSCOPY N/A 08/18/2019   Procedure: CYSTOSCOPY CLOT EVACUATION FULGURATION 0.5-2 CM.;  Surgeon: Festus Aloe, MD;  Location: [place];  Service: Urology;  Laterality: N/A;  . LEFT HEART CATH AND CORONARY ANGIOGRAPHY N/A 08/09/2019   Procedure: RIGHT & LEFT HEART CATH AND CORONARY ANGIOGRAPHY;  Surgeon: Lorretta Harp, MD;  Location: [place] [facility]:: post-arrest : 80% LCx-100% OM3 (DES PCI), pRCA tandem 50 & 80% (DES PCI), Bifurcation LAD (75% calcified)-ostial D1 95%.  LVEDP 11 mmHg, PCWP 15 mmHg.   John Love TRANSTHORACIC ECHOCARDIOGRAM  08/10/2019    Post cardiac arrest: EF 55 to 60%.  No R WMA.  GR 1 DD.  Normal RV.  Normal valves.   08/09/2019 (post arrest - Imf STEMI)     Staged LAD PCI 08/29/2019       MEDICATIONS/ALLERGIES   Current Meds  Medication Sig  . acetaminophen (TYLENOL) 500 MG tablet Take 1,000 mg by mouth every 6 (six) hours as needed for moderate pain or headache.  John Love amLODipine-olmesartan (AZOR) 5-20 MG tablet Take 1 tablet by mouth daily.  John Love aspirin EC 81 MG tablet Take 81 mg by mouth daily.  . carvedilol (COREG) 25 MG tablet Take 1 tablet (25 mg total) by mouth 2 (two) times daily with a meal.  . Multiple Vitamin (MULTIVITAMIN ADULT PO) Take 2 tablets by mouth daily.  . tamsulosin (FLOMAX) 0.4 MG CAPS capsule Take 1 capsule (0.4 mg total) by mouth daily after supper. (John Love taking differently: Take 0.4 mg by mouth daily. )  . ticagrelor (BRILINTA) 90 MG TABS tablet Take 1 tablet (90 mg total) by mouth 2 (two) times daily.  . [DISCONTINUED] atorvastatin (LIPITOR) 80 MG tablet Take 1 tablet (80 mg total) by mouth daily at 6 PM.  . [DISCONTINUED] ramipril (ALTACE) 2.5 MG capsule Take 1 capsule (2.5 mg total) by mouth daily.    No Known Allergies  SOCIAL HISTORY/FAMILY HISTORY   Reviewed in Epic:  Pertinent findings: no  changes  OBJCTIVE -PE, EKG, labs   Wt Readings from Last 3 Encounters:  12/12/19 199 lb 3.2 oz (90.4 kg)  09/12/19 199 lb 12.8 oz (90.6 kg)  08/30/19 200 lb 1.6 oz (90.8 kg)    Physical Exam: BP 130/78   Pulse 73   Temp (!) 97.1 F (36.2 C)   Ht 6\' 2"  (1.88 m)   Wt 199 lb 3.2 oz (90.4 kg)   SpO2 99%   BMI 25.58 kg/m  Physical Exam Vitals reviewed.  Constitutional:      General: He is not in acute distress.    Appearance: Normal appearance. He is normal weight.  HENT:     Head: Normocephalic and atraumatic.  Neck:     Vascular: No carotid bruit, hepatojugular reflux or JVD.  Cardiovascular:     Rate and Rhythm: Normal rate and regular rhythm.  No extrasystoles are present.    Chest Wall: PMI is not displaced.     Pulses: Normal pulses and intact distal pulses.     Heart sounds: Normal heart sounds. No murmur heard.  No friction rub. No gallop.   Pulmonary:     Effort: Pulmonary effort is normal. No respiratory distress.     Breath sounds: Normal breath sounds.  Abdominal:     General: Abdomen is flat. Bowel sounds are normal. There is no distension.     Palpations: Abdomen is soft.     Comments: No HSM  Musculoskeletal:        General: No swelling. Normal range of motion.     Cervical back: Normal range of motion and neck supple.  Neurological:     General: No focal deficit present.     Mental Status: He is alert and oriented to person, place, and time.  Psychiatric:        Mood and Affect: Mood normal.        Behavior: Behavior normal.        Thought Content: Thought content normal.        Judgment: Judgment normal.      Adult ECG Report Not checked  Recent Labs:    Lab Results  Component Value Date   CHOL 80 (L)  12/08/2019   HDL 34 (L) 12/08/2019   LDLCALC 33 12/08/2019   TRIG 49 12/08/2019   CHOLHDL 2.4 12/08/2019    Lab Results  Component Value Date   CREATININE 0.98 12/08/2019   BUN 15 12/08/2019   NA 142 12/08/2019   K 5.2 12/08/2019   CL  103 12/08/2019   CO2 24 12/08/2019   No results found for: TSH October ASSESSMENT/PLAN    Problem List Items Addressed This Visit    Cardiac arrest with ventricular fibrillation (HCC) -> 20 minutes CPR with ROSC (Chronic)    Quite amazingly, he must had very effective CPR because he has no neurologic deficits and his EF has remained preserved.  He had short-lived cardiogenic shock that required brief course of vasopressors, but resolved relatively quickly.  Did not even require Arctic Sun Cooling Protocol.  No requirement for ICD now that he is post intervention and has preserved EF..  Is on high-dose carvedilol.      Relevant Medications   amLODipine-olmesartan (AZOR) 5-20 MG tablet   atorvastatin (LIPITOR) 40 MG tablet   Multiple vessel coronary artery disease - Primary (Chronic)    Essentially completely revascularized with PCI.  He is now on DAPT as noted along with statin, high-dose beta-blocker as well as amlodipine-olmesartan.  Preserved EF on echo with no anginal or heart failure symptoms.  My understanding that was that he was getting go to cardiac rehab, but it appears that he has not done so.  He is doing fairly well with his own diet and exercise and progressing quite nicely.      Relevant Medications   amLODipine-olmesartan (AZOR) 5-20 MG tablet   atorvastatin (LIPITOR) 40 MG tablet   Other Relevant Orders   Lipid panel   Comprehensive metabolic panel   CAD S/P 4 V PCI (Chronic)    Extensive four-vessel PCI, is on DAPT with aspirin and Brilinta.  Uninterrupted Brilinta for 1 year unless there is very urgent procedure as needed.  Would be okay to stop/hold aspirin after August.  Monitor for signs of bleeding.      Relevant Medications   amLODipine-olmesartan (AZOR) 5-20 MG tablet   atorvastatin (LIPITOR) 40 MG tablet   Essential hypertension (Chronic)    Blood pressure is borderline but well controlled on current dose of carvedilol and amlodipine-olmesartan -> his  pre-PCI ramipril was discontinued.  Medication list updated.   Continue to monitor, next line of therapy would be a diuretic.      Relevant Medications   amLODipine-olmesartan (AZOR) 5-20 MG tablet   atorvastatin (LIPITOR) 40 MG tablet   Hyperlipidemia with target LDL less than 70 (Chronic)    Excellent response to initiation of high-dose high intensity atorvastatin.  I think we can reduce to 40 mg daily now.  Should be due for labs to be followed up in roughly October timeframe prior to follow-up.      Relevant Medications   amLODipine-olmesartan (AZOR) 5-20 MG tablet   atorvastatin (LIPITOR) 40 MG tablet   Other Relevant Orders   Lipid panel   Comprehensive metabolic panel   AKI (acute kidney injury) ([place])    In the setting of cardiac arrest, he had acute renal insufficiency, now creatinine is back to 0.98 and doing well.  Potassium was borderline elevated, but stable.          COVID-19 Education: The signs and symptoms of COVID-19 were discussed with the John Love and how to seek care for testing (follow up with PCP or arrange  E-visit).   The importance of social distancing and COVID-19 vaccination was discussed today.  I spent a total of 24 minutes with the John Love. >  50% of the time was spent in direct John Love consultation.  Additional time spent with chart review  / charting (studies, outside notes, etc): 18-> cath films and report reviewed along with echocardiogram.  Previous clinic notes reviewed. Total Time: 42 min   Current medicines are reviewed at length with the John Love today.  (+/- concerns) none  Notice: This dictation was prepared with Dragon dictation along with smaller phrase technology. Any transcriptional errors that result from this process are unintentional and may not be corrected upon review.  John Love Instructions / Medication Changes & Studies & Tests Ordered   John Love Instructions  Medication Instructions:   Change   Atorvastatin 40 mg  Daily     If you need a refill on your cardiac medications before your next appointment, please call your pharmacy*   Lab Work: Lipids -- in Oct 2021 If you have labs (blood work) drawn today and your tests are completely normal, you will receive your results only by: John Love MyChart Message (if you have MyChart) OR . A paper copy in the mail If you have any lab test that is abnormal or we need to change your treatment, we will call you to review the results.   Testing/Procedures:  not needed   Follow-Up: At [facility], you and your health needs are our priority.  As part of our continuing mission to provide you with exceptional heart care, we have created designated Provider Care Teams.  These Care Teams include your primary Cardiologist (physician) and Advanced Practice Providers (APPs -  Physician Assistants and Nurse Practitioners) who all work together to provide you with the care you need, when you need it.  We recommend signing up for the John Love portal called "MyChart".  Sign up information is provided on this After Visit Summary.  MyChart is used to connect with patients for Virtual Visits (Telemedicine).  Patients are able to view lab/test results, encounter notes, upcoming appointments, etc.  Non-urgent messages can be sent to your provider as well.   To learn more about what you can do with MyChart, go to [email or web address].    Your next appointment:   4 month(s)  The format for your next appointment:   In Person  Provider:   [name], MD   Other Instructions   Studies Ordered:   Orders Placed This Encounter  Procedures  . Lipid panel  . Comprehensive metabolic panel     [name], M.D., M.S. Interventional Cardiologist   Pager # [phone] Phone # [phone] [address] [place], [place] [address]   Thank you for choosing Heartcare at [facility]!!

## 2019-12-13 ENCOUNTER — Telehealth: Payer: Self-pay

## 2019-12-13 NOTE — Telephone Encounter (Addendum)
Tried calling patient to give results, could not leave a voice message for it is not set up. Mailed letter out with comments from provider.   ----- Message from Lendon Colonel, NP sent at 12/09/2019  3:18 PM EDT ----- I have reviewed the labs. He is no longer anemic, chemistries and liver enzymes are normal. No concerns here.

## 2019-12-16 ENCOUNTER — Encounter: Payer: Self-pay | Admitting: Cardiology

## 2019-12-16 NOTE — Assessment & Plan Note (Signed)
Blood pressure is borderline but well controlled on current dose of carvedilol and amlodipine-olmesartan -> his pre-PCI ramipril was discontinued.  Medication list updated.   Continue to monitor, next line of therapy would be a diuretic.

## 2019-12-16 NOTE — Assessment & Plan Note (Signed)
Excellent response to initiation of high-dose high intensity atorvastatin.  I think we can reduce to 40 mg daily now.  Should be due for labs to be followed up in roughly October timeframe prior to follow-up.

## 2019-12-16 NOTE — Assessment & Plan Note (Signed)
Extensive four-vessel PCI, is on DAPT with aspirin and Brilinta.  Uninterrupted Brilinta for 1 year unless there is very urgent procedure as needed.  Would be okay to stop/hold aspirin after August.  Monitor for signs of bleeding.

## 2019-12-16 NOTE — Assessment & Plan Note (Signed)
In the setting of cardiac arrest, he had acute renal insufficiency, now creatinine is back to 0.98 and doing well.  Potassium was borderline elevated, but stable.

## 2019-12-16 NOTE — Assessment & Plan Note (Signed)
Essentially completely revascularized with PCI.  He is now on DAPT as noted along with statin, high-dose beta-blocker as well as amlodipine-olmesartan.  Preserved EF on echo with no anginal or heart failure symptoms.  My understanding that was that he was getting go to cardiac rehab, but it appears that he has not done so.  He is doing fairly well with his own diet and exercise and progressing quite nicely.

## 2019-12-16 NOTE — Assessment & Plan Note (Addendum)
Quite amazingly, he must had very effective CPR because he has no neurologic deficits and his EF has remained preserved.  He had short-lived cardiogenic shock that required brief course of vasopressors, but resolved relatively quickly.  Did not even require Arctic Sun Cooling Protocol.  No requirement for ICD now that he is post intervention and has preserved EF..  Is on high-dose carvedilol.

## 2019-12-20 DIAGNOSIS — R3914 Feeling of incomplete bladder emptying: Secondary | ICD-10-CM | POA: Diagnosis not present

## 2019-12-20 DIAGNOSIS — N401 Enlarged prostate with lower urinary tract symptoms: Secondary | ICD-10-CM | POA: Diagnosis not present

## 2019-12-22 DIAGNOSIS — R3914 Feeling of incomplete bladder emptying: Secondary | ICD-10-CM | POA: Diagnosis not present

## 2019-12-22 DIAGNOSIS — N453 Epididymo-orchitis: Secondary | ICD-10-CM | POA: Diagnosis not present

## 2019-12-22 DIAGNOSIS — N401 Enlarged prostate with lower urinary tract symptoms: Secondary | ICD-10-CM | POA: Diagnosis not present

## 2019-12-22 DIAGNOSIS — R972 Elevated prostate specific antigen [PSA]: Secondary | ICD-10-CM | POA: Diagnosis not present

## 2020-01-15 DIAGNOSIS — R918 Other nonspecific abnormal finding of lung field: Secondary | ICD-10-CM

## 2020-01-15 DIAGNOSIS — C3431 Malignant neoplasm of lower lobe, right bronchus or lung: Secondary | ICD-10-CM

## 2020-01-15 HISTORY — DX: Malignant neoplasm of lower lobe, right bronchus or lung: C34.31

## 2020-01-15 HISTORY — DX: Other nonspecific abnormal finding of lung field: R91.8

## 2020-01-28 DIAGNOSIS — I700 Atherosclerosis of aorta: Secondary | ICD-10-CM | POA: Diagnosis not present

## 2020-01-28 DIAGNOSIS — S2222XA Fracture of body of sternum, initial encounter for closed fracture: Secondary | ICD-10-CM | POA: Diagnosis not present

## 2020-01-28 DIAGNOSIS — S2241XA Multiple fractures of ribs, right side, initial encounter for closed fracture: Secondary | ICD-10-CM | POA: Diagnosis not present

## 2020-01-28 DIAGNOSIS — R05 Cough: Secondary | ICD-10-CM | POA: Diagnosis not present

## 2020-01-28 DIAGNOSIS — I2510 Atherosclerotic heart disease of native coronary artery without angina pectoris: Secondary | ICD-10-CM | POA: Diagnosis not present

## 2020-01-28 DIAGNOSIS — R042 Hemoptysis: Secondary | ICD-10-CM | POA: Diagnosis not present

## 2020-02-02 ENCOUNTER — Telehealth: Payer: Self-pay | Admitting: *Deleted

## 2020-02-02 NOTE — Telephone Encounter (Signed)
Late entry  Call @ 112:27 am  Dr Hilton Cork called to speak to Dr Ellyn Hack in regards to patient   per Dr Ellyn Hack, patient has lung mass needs biopsy-  Okay  To hold Brilinta for 7 days prior to biopsy and 2 days post biopsy .  information given to Dr Hilton Cork.

## 2020-02-09 HISTORY — PX: VIDEO BRONCHOSCOPY WITH RADIAL ENDOBRONCHIAL ULTRASOUND: SHX6849

## 2020-02-15 ENCOUNTER — Encounter: Payer: Self-pay | Admitting: Cardiology

## 2020-02-15 ENCOUNTER — Ambulatory Visit (INDEPENDENT_AMBULATORY_CARE_PROVIDER_SITE_OTHER): Payer: Self-pay | Admitting: Cardiology

## 2020-02-15 ENCOUNTER — Other Ambulatory Visit: Payer: Self-pay

## 2020-02-15 VITALS — BP 132/76 | HR 65 | Ht 74.0 in | Wt 202.0 lb

## 2020-02-15 DIAGNOSIS — I469 Cardiac arrest, cause unspecified: Secondary | ICD-10-CM

## 2020-02-15 DIAGNOSIS — I2510 Atherosclerotic heart disease of native coronary artery without angina pectoris: Secondary | ICD-10-CM

## 2020-02-15 DIAGNOSIS — I4901 Ventricular fibrillation: Secondary | ICD-10-CM

## 2020-02-15 DIAGNOSIS — Z01810 Encounter for preprocedural cardiovascular examination: Secondary | ICD-10-CM

## 2020-02-15 DIAGNOSIS — I10 Essential (primary) hypertension: Secondary | ICD-10-CM

## 2020-02-15 DIAGNOSIS — E785 Hyperlipidemia, unspecified: Secondary | ICD-10-CM

## 2020-02-15 DIAGNOSIS — Z79899 Other long term (current) drug therapy: Secondary | ICD-10-CM

## 2020-02-15 DIAGNOSIS — Z9861 Coronary angioplasty status: Secondary | ICD-10-CM

## 2020-02-15 NOTE — Patient Instructions (Addendum)
Medication Instructions:  Continue current medications  *If you need a refill on your cardiac medications before your next appointment, please call your pharmacy*   Lab Work: CMP and  Lipids in October  If you have labs (blood work) drawn today and your tests are completely normal, you will receive your results only by: [name] MyChart Message (if you have MyChart) OR . A paper copy in the mail If you have any lab test that is abnormal or we need to change your treatment, we will call you to review the results.   Testing/Procedures: None Ordered   Follow-Up: At [facility], you and your health needs are our priority.  As part of our continuing mission to provide you with exceptional heart care, we have created designated Provider Care Teams.  These Care Teams include your primary Cardiologist (physician) and Advanced Practice Providers (APPs -  Physician Assistants and Nurse Practitioners) who all work together to provide you with the care you need, when you need it.  We recommend signing up for the patient portal called "MyChart".  Sign up information is provided on this After Visit Summary.  MyChart is used to connect with patients for Virtual Visits (Telemedicine).  Patients are able to view lab/test results, encounter notes, upcoming appointments, etc.  Non-urgent messages can be sent to your provider as well.   To learn more about what you can do with MyChart, go to [email or web address].    Your next appointment:   January 2022  The format for your next appointment:   In Person  Provider:   [name], MD

## 2020-02-15 NOTE — Progress Notes (Signed)
Primary Care Provider: Patient, No Pcp Per Cardiologist: [name], MD Electrophysiologist: None  Pulmonologist ([facility]): Darcus Austin, DO   Clinic Note: Chief Complaint  Patient presents with  . Follow-up    28-month  . Coronary Artery Disease    No angina  . Lung Mass    Has had some hemoptysis, recent biopsy    HPI:    John Love is a 69 y.o. male current smoker (previously 1.5 PPD since age 41, currently 0.5 PPD) with a PMH notable for multivessel CAD revealed during emergent cardiac catheterization in response to [place] with INFEROLATERAL ST ELEVATIONS while in the recovery area following his 2nd  COVID-19 vaccine.  He is now status post 4-vessel PCI (initial PCI of culprit lesion in the LCx-OM followed by proximal RCA then staged atherectomy based bifurcation PCI of proximal to the full mid LAD-ostial D1.  (See note from December 12, 2019 for full details of his initial hospitalization) He now presents for 41-month follow-up  John Love was seen on December 12, 2019-doing very well.  He was changing his PCP.  He was dealing with a bout of pneumonia and had to start to get back and activity level.  Had not yet started cardiac rehab but was doing strength conditioning exercises at home and walking about 2-1/2 miles a day.  No further chest pain or pressure.  No palpitations.  Reduced to 40 mg atorvastatin.  No other changes.  Recent Hospitalizations:   Went to the ER on January 28, 2020 ([facility]) --> indicated that he had a recent "lung infection and started feeling the same just prior to presentation.  Noted cough with sputum and drops of blood.  Chest CT: Moderate severity calcification of the aortic arch and marked severity coronary artery calcification.  Mild pretracheal and right hilar lymphadenopathy with mild amount of heterogeneous material within the right mainstem bronchus with subsequent occlusion.  Moderate severity scarring and/or atelectasis  within bilateral apices.  6.6 cm x 6.4 cm heterogeneous low-attenuation mass seen within posterior medial aspect of right lower lobe extending to the right hilum.  ->  Concerning for lung cancer  Noninflamed diverticula in the large bowel.  Chronic fracture seen involving the body of the sternum along with chronic anterior second through fifth rib fractures.   Seen in consultation by Dr. Hilton Cork February 02, 2020: She noted that he has been having productive cough since hospitalization in March.  Had completed 2 courses of antibiotics.  There was concern during his cardiac arrest episode that he had aspirated vomitus with exchange of the [facility] airway to ETT.  (Several chest x-rays and CT abdomen pelvis did not mention right lower lobe mass, but did comment on right lower lobe volume loss/collapse.  CT scan from [facility] revealed the right lower lobe mass concerning for malignancy.  Was currently on Augmentin; mass has suspicion of potentially invading the bronchus intermedius (differential was lung cancer versus pulmonary abscess although cancer favored.  I was actually contacted on that day to give approval to hold Brilinta for 7 days preop bronchoscopy with EBUS plus GETA (02/09/2020)  Was presented to tumor board on 02/09/2020  Plan for PET on 02/22/2020, brain MRI 02/20/2020  Referral to Dr. Corene Cornea health with medical oncology.   Reviewed  CV studies:    The following studies were reviewed today: (if available, images/films reviewed: From Epic Chart or Care Everywhere) --> cath diagrams below.  None.  Interval History:   John Love is here really with no cardiac complaints.  He did not even mention the biopsy procedure at [facility].  He says he walks early in the morning when his school about a mile or so.  He also spends a good part of the day hitting golf balls.  He says that he is "retired "and has the time to play golf.  He really denies any cardiac symptoms.  He still has a little  of cough but no hemoptysis.  A little bit of exertional dyspnea/exercise intolerance, but much less than it had previously.  No chest pain or pressure.  He initially thought that he was just try to get over his pneumonia, but not clearly visit on the reason.  CV Review of Symptoms (Summary) Cardiovascular ROS: no chest pain or dyspnea on exertion positive for - Mild exercise intolerance and some dyspnea; cough negative for - edema, irregular heartbeat, orthopnea, palpitations, paroxysmal nocturnal dyspnea, rapid heart rate or Lightheadedness, dizziness or syncope/near syncope, TIA/amaurosis fugax, claudication  The patient DOES NOT have symptoms concerning for COVID-19 infection (fever, chills, cough, or new shortness of breath).  The patient is practicing social distancing & Masking.   He completed his COVID-19 vaccine injections on February 23   REVIEWED OF SYSTEMS   Review of Systems  Constitutional: Negative for malaise/fatigue (Gradually getting his energy level back) and weight loss.  HENT: Negative for nosebleeds.   Respiratory: Positive for cough, shortness of breath (With pneumonia, but now better) and wheezing (Clearing up as his pneumonia clears up). Negative for sputum production (He does have some sputum, but not the "nasty "sputum that he used to have.).   Cardiovascular: Negative for claudication and leg swelling.  Gastrointestinal: Negative for blood in stool, constipation and melena.  Genitourinary: Negative for hematuria.  Musculoskeletal: Negative for myalgias.  Neurological: Negative for dizziness, focal weakness and headaches.  Psychiatric/Behavioral: Negative for depression. The patient is not nervous/anxious.    I have reviewed and (if needed) personally updated the patient's problem list, medications, allergies, past medical and surgical history, social and family history.   PAST MEDICAL HISTORY   Past Medical History:  Diagnosis Date  . CAD S/P 4V PCI  08/10/2019   08/09/19: CULPRIT: m-dLCx 80%--OM2 100% @ small branch take-off. --> DES PCI: 2.25 mm x 28 mm Synergy DES (2.75 mm);  pRCA 50%-80% tandem lesions --> DES PCI: 3.5 mm x 24 mm Synergy DES (4.1 mm) 08/29/19: Staged Bifurcation PCI of p-dLAD@D1  with CSI atherectomy and scoring low PTA of ostial D1-  (4 total stents, initial stent from LAD into D1 (Synergy 2.75 x 16-2.9 mm, with 3 overlapping stents in L  . Cardiac arrest with ventricular fibrillation (HCC) -> 20 minutes CPR with ROSC 08/09/2019   15 minutes following second COVID-19 vaccination -> vasovagal episode during the hypotension followed by VF arrest; found to have 4 vessel CAD on cath with culprit 100% OM 3--now status post four-vessel PCI  . Multiple vessel coronary artery disease 08/10/2019   Heart Cath -PCI 08/09/2019:  CULPRIT LESION: Mid Cx to Dist Cx lesion is 80% stenosed. 3rd Mrg lesion is 100% stenosed at small branch take-off. DES PCI: 2.25 mm x 28 mm Synergy DES (proximal postdilated 2.75 mm.  Post intervention, there is a 0% residual stenosis.  Small side branch was occluded Ost RCA to Prox RCA lesion is 50% stenosed.Prox RCA lesion is 80% stenosed.  DES PCI: 3.5 mm x 24 mm Sy  . Respiratory  failure ([place])   . Right lower lobe lung mass 01/2020   Seen on CT scan 01/28/2020-status post bronchoscopy with biopsy -> pathology pending.  Pending tumor board discussion.  . Sustained ventricular fibrillation ([place]) 08/09/2019   In setting of acute MI   Immunization History  Administered Date(s) Administered  . PFIZER SARS-COV-2 Vaccination 07/19/2019, 08/09/2019    PAST SURGICAL HISTORY   Past Surgical History:  Procedure Laterality Date  . CORONARY ATHERECTOMY N/A 08/29/2019   Procedure: CORONARY ATHERECTOMY;  Surgeon: Leonie Man, MD;  Location: [place] [facility];  Service: Cardiovascular; SCORING BALLOON ANGIOPLASTY- ostial D1, CSI ATHERECTOMY p-mLAD -> followed by bifurcation stenting  . CORONARY STENT INTERVENTION N/A  08/09/2019   Procedure: CORONARY STENT INTERVENTION;  Surgeon: Lorretta Harp, MD::  2V PCI: Culprit lesion ~100% OM3 (with 80% main LCx) --> DES PCI covering both lesions-Synergy DES 2.25 mm x 28 mm, postdilated to 2.75 mm; LESION #2 proximal RCA 50% followed by 80% --> DES PCI covering both-Synergy DES 3.5 mm x 24 mm  . CORONARY STENT INTERVENTION N/A 08/29/2019   Procedure: CORONARY STENT INTERVENTION;  Surgeon: Leonie Man, MD:: Bifurcation DES PCI, Reverse Culotte Technique W/ KB (4 total SYNERGY DES stents, 1st (2.75X16 - 2.9 MM) - pLAD-ostD1 & 3 overlapping in LAD (3.0 x 38, 3.0 x 16, 2.75 x16 - tapered POT postdilation 3.6-3.1 & apical LAD PTA only.  . CYSTOSCOPY N/A 08/18/2019   Procedure: CYSTOSCOPY CLOT EVACUATION FULGURATION 0.5-2 CM.;  Surgeon: Festus Aloe, MD;  Location: [facility];  Service: Urology;  Laterality: N/A;  . LEFT HEART CATH AND CORONARY ANGIOGRAPHY N/A 08/09/2019   Procedure: RIGHT & LEFT HEART CATH AND CORONARY ANGIOGRAPHY;  Surgeon: Lorretta Harp, MD;  Location: [place] [facility]:: post-arrest : 80% LCx-100% OM3 (DES PCI), pRCA tandem 50 & 80% (DES PCI), Bifurcation LAD (75% calcified)-ostial D1 95%.  LVEDP 11 mmHg, PCWP 15 mmHg.   [name] TRANSTHORACIC ECHOCARDIOGRAM  08/10/2019    Post cardiac arrest: EF 55 to 60%.  No R WMA.  GR 1 DD.  Normal RV.  Normal valves.   08/09/2019 (post arrest - Imf STEMI)     Staged LAD PCI 08/29/2019       MEDICATIONS/ALLERGIES   Current Meds  Medication Sig  . acetaminophen (TYLENOL) 500 MG tablet Take 1,000 mg by mouth every 6 (six) hours as needed for moderate pain or headache.  [name] amLODipine-olmesartan (AZOR) 5-20 MG tablet Take 1 tablet by mouth daily.  [name] aspirin EC 81 MG tablet Take 81 mg by mouth daily.  [name] atorvastatin (LIPITOR) 40 MG tablet Take 1 tablet (40 mg total) by mouth daily.  . carvedilol (COREG) 25 MG tablet Take 1 tablet (25 mg total) by mouth 2 (two) times daily with a meal.  . Multiple Vitamin  (MULTIVITAMIN ADULT PO) Take 2 tablets by mouth daily.  . tamsulosin (FLOMAX) 0.4 MG CAPS capsule Take 1 capsule (0.4 mg total) by mouth daily after supper. (Patient taking differently: Take 0.4 mg by mouth daily. )  . ticagrelor (BRILINTA) 90 MG TABS tablet Take 1 tablet (90 mg total) by mouth 2 (two) times daily.    No Known Allergies  SOCIAL HISTORY/FAMILY HISTORY   Reviewed in Epic:  Pertinent findings: no changes  OBJCTIVE -PE, EKG, labs   Wt Readings from Last 3 Encounters:  02/15/20 202 lb (91.6 kg)  12/12/19 199 lb 3.2 oz (90.4 kg)  09/12/19 199 lb 12.8 oz (90.6 kg)    Physical  Exam: BP 132/76   Pulse 65   Ht 6\' 2"  (1.88 m)   Wt 202 lb (91.6 kg)   SpO2 100%   BMI 25.94 kg/m  Physical Exam Vitals reviewed.  Constitutional:      General: He is not in acute distress.    Appearance: Normal appearance. He is normal weight.     Comments: Healthy-appearing.  Well-groomed.  HENT:     Head: Normocephalic and atraumatic.  Neck:     Vascular: No carotid bruit, hepatojugular reflux or JVD.  Cardiovascular:     Rate and Rhythm: Normal rate and regular rhythm.  No extrasystoles are present.    Chest Wall: PMI is not displaced.     Pulses: Normal pulses and intact distal pulses.     Heart sounds: Normal heart sounds. No murmur heard.  No friction rub. No gallop.   Pulmonary:     Effort: Pulmonary effort is normal. No respiratory distress.     Breath sounds: Normal breath sounds.  Chest:     Chest wall: No tenderness.  Abdominal:     Comments: No HSM  Musculoskeletal:        General: No swelling. Normal range of motion.     Cervical back: Normal range of motion and neck supple.  Neurological:     General: No focal deficit present.     Mental Status: He is alert and oriented to person, place, and time.  Psychiatric:        Mood and Affect: Mood normal.        Behavior: Behavior normal.        Thought Content: Thought content normal.        Judgment: Judgment  normal.      Adult ECG Report Not checked  Recent Labs:    Lab Results  Component Value Date   CHOL 80 (L) 12/08/2019   HDL 34 (L) 12/08/2019   LDLCALC 33 12/08/2019   TRIG 49 12/08/2019   CHOLHDL 2.4 12/08/2019    Lab Results  Component Value Date   CREATININE 0.98 12/08/2019   BUN 15 12/08/2019   NA 142 12/08/2019   K 5.2 12/08/2019   CL 103 12/08/2019   CO2 24 12/08/2019   No results found for: TSH October ASSESSMENT/PLAN    Problem List Items Addressed This Visit    Cardiac arrest with ventricular fibrillation ([place]) -> 20 minutes CPR with ROSC (Chronic)    Had very effective CPR as he has had no neurologic deficits post PCI.  Preserved EF despite have multivessel CAD.  Very short-term cardiogenic shock.  No Arctic sun. With preserved EF, no ICD requirement since he was clearly ischemic.  Continue high-dose beta-blocker.      Multiple vessel coronary artery disease - Primary (Chronic)    He had been very active and had not necessarily noted any symptoms besides maybe some progressive dyspnea over the last year.  Now has extensive PCI.  Plan:   Continue current dose of amlodipine-olmesartan and carvedilol as blood pressure seems well controlled.  Continue atorvastatin  Is on aspirin plus Brilinta ->  This is a difficult situation because he is >6 months out now from his multivessel PCI but has significant amount of stents placed.  Would prefer for him to stay on it uninterrupted agents for 1 year (if there is concern for excessive bleeding on Brilinta, can convert to Plavix) -> I suspect that he may very well require major thoracic surgery in which case he will  have to hold antiplatelet agent for 7 days preop.  The urgency of his lung cancer treatment supersedes desire for uninterrupted antiplatelet agent for a year.  He has already held for his biopsy and he did well.       CAD S/P 4 V PCI (Chronic)    Extensive four-vessel PCI currently on aspirin and Brilinta  for DAPT. Would hope to continue uninterrupted Brilinta, but likely will need to be interrupted for procedures involving his new diagnosis of likely lung cancer.   If there is concern about bleeding, can stop aspirin now and go with single agent therapy.  For procedures involving his likely lung cancer diagnosis, would be okay to hold Brilinta 5-7 days preop  If there is concern for excessive bleeding, could convert to Plavix 75 mg alone      Essential hypertension (Chronic)    Blood pressures well controlled on current meds.  No change      Hyperlipidemia with target LDL less than 70 (Chronic)    Lipids look pretty good in June.  Should be due for follow-up labs in October.  We will then see him back in February.      Relevant Orders   Lipid panel   Preop cardiovascular exam    Based on the CT scan imaging and concern for cancer, he is likely going to require surgery.  He now is relatively asymptomatic from a cardiac standpoint.  He has preserved EF.  Is status post four-vessel PCI.  He would not require any further cardiac evaluation prior to any surgery.  He has not had heart failure, is nondiabetic and has normal renal function. Intrathoracic surgery would be considered high risk, and his presence of CAD would also be counted.  He would be class III risk with a 10.1% 30-day risk of death, MI or cardiac arrest for high risk surgery in setting of recent CAD.  However, with no active symptoms and recent revascularization, would not warrant further ischemic evaluation.   If necessary for thoracic surgery, would be okay to hold antiplatelet agents 5-7 days preop.  Would not continue aspirin postop and strongly consider converting from Brilinta to Plavix on restart (first day would be 300 mg p.o. followed by 75 mg p.o. daily) to avoid excessive bleeding.       Other Visit Diagnoses    Medication management       Relevant Orders   Comprehensive Metabolic Panel (CMET)   Lipid  panel       COVID-19 Education: The signs and symptoms of COVID-19 were discussed with the patient and how to seek care for testing (follow up with PCP or arrange E-visit).   The importance of social distancing and COVID-19 vaccination was discussed today.  I spent a total of 24 minutes with the patient. >  50% of the time was spent in direct patient consultation.  Additional time spent with chart review  / charting (studies, outside notes, etc): 18-> cath films and report reviewed along with echocardiogram.  Previous clinic notes reviewed. Total Time: 42 min   Current medicines are reviewed at length with the patient today.  (+/- concerns) none  Notice: This dictation was prepared with Dragon dictation along with smaller phrase technology. Any transcriptional errors that result from this process are unintentional and may not be corrected upon review.  Patient Instructions / Medication Changes & Studies & Tests Ordered   Patient Instructions  Medication Instructions:  Continue current medications  *If you need a refill  on your cardiac medications before your next appointment, please call your pharmacy*   Lab Work: CMP and  Lipids in October  If you have labs (blood work) drawn today and your tests are completely normal, you will receive your results only by: [name] MyChart Message (if you have MyChart) OR . A paper copy in the mail If you have any lab test that is abnormal or we need to change your treatment, we will call you to review the results.   Testing/Procedures: None Ordered   Follow-Up: At [facility], you and your health needs are our priority.  As part of our continuing mission to provide you with exceptional heart care, we have created designated Provider Care Teams.  These Care Teams include your primary Cardiologist (physician) and Advanced Practice Providers (APPs -  Physician Assistants and Nurse Practitioners) who all work together to provide you with the care  you need, when you need it.  We recommend signing up for the patient portal called "MyChart".  Sign up information is provided on this After Visit Summary.  MyChart is used to connect with patients for Virtual Visits (Telemedicine).  Patients are able to view lab/test results, encounter notes, upcoming appointments, etc.  Non-urgent messages can be sent to your provider as well.   To learn more about what you can do with MyChart, go to [email or web address].    Your next appointment:   January 2022  The format for your next appointment:   In Person  Provider:   [name], MD   Studies Ordered:   Orders Placed This Encounter  Procedures  . Comprehensive Metabolic Panel (CMET)  . Lipid panel     [name], M.D., M.S. Interventional Cardiologist   Pager # [phone] Phone # [phone] [address] [place], [place] [address]   Thank you for choosing Heart[facility] at [facility]!!

## 2020-02-20 DIAGNOSIS — I619 Nontraumatic intracerebral hemorrhage, unspecified: Secondary | ICD-10-CM | POA: Diagnosis not present

## 2020-02-20 DIAGNOSIS — I6381 Other cerebral infarction due to occlusion or stenosis of small artery: Secondary | ICD-10-CM | POA: Diagnosis not present

## 2020-02-20 DIAGNOSIS — I6782 Cerebral ischemia: Secondary | ICD-10-CM | POA: Diagnosis not present

## 2020-02-20 DIAGNOSIS — J3489 Other specified disorders of nose and nasal sinuses: Secondary | ICD-10-CM | POA: Diagnosis not present

## 2020-02-22 ENCOUNTER — Encounter: Payer: Self-pay | Admitting: Cardiology

## 2020-02-22 ENCOUNTER — Telehealth: Payer: Self-pay | Admitting: Cardiology

## 2020-02-22 DIAGNOSIS — Z01810 Encounter for preprocedural cardiovascular examination: Secondary | ICD-10-CM | POA: Insufficient documentation

## 2020-02-22 DIAGNOSIS — C3431 Malignant neoplasm of lower lobe, right bronchus or lung: Secondary | ICD-10-CM | POA: Diagnosis not present

## 2020-02-22 NOTE — Assessment & Plan Note (Signed)
Had very effective CPR as he has had no neurologic deficits post PCI.  Preserved EF despite have multivessel CAD.  Very short-term cardiogenic shock.  No Arctic sun. With preserved EF, no ICD requirement since he was clearly ischemic.  Continue high-dose beta-blocker.

## 2020-02-22 NOTE — Assessment & Plan Note (Signed)
Based on the CT scan imaging and concern for cancer, he is likely going to require surgery.  He now is relatively asymptomatic from a cardiac standpoint.  He has preserved EF.  Is status post four-vessel PCI.  He would not require any further cardiac evaluation prior to any surgery.  He has not had heart failure, is nondiabetic and has normal renal function. Intrathoracic surgery would be considered high risk, and his presence of CAD would also be counted.  He would be class III risk with a 10.1% 30-day risk of death, MI or cardiac arrest for high risk surgery in setting of recent CAD.  However, with no active symptoms and recent revascularization, would not warrant further ischemic evaluation.   If necessary for thoracic surgery, would be okay to hold antiplatelet agents 5-7 days preop.  Would not continue aspirin postop and strongly consider converting from Brilinta to Plavix on restart (first day would be 300 mg p.o. followed by 75 mg p.o. daily) to avoid excessive bleeding.

## 2020-02-22 NOTE — Telephone Encounter (Signed)
[place] at [facility] called because the found thrombsis in the pt's left atrium and needs an appt asap. Please call back.

## 2020-02-22 NOTE — Assessment & Plan Note (Signed)
Extensive four-vessel PCI currently on aspirin and Brilinta for DAPT. Would hope to continue uninterrupted Brilinta, but likely will need to be interrupted for procedures involving his new diagnosis of likely lung cancer.   If there is concern about bleeding, can stop aspirin now and go with single agent therapy.  For procedures involving his likely lung cancer diagnosis, would be okay to hold Brilinta 5-7 days preop  If there is concern for excessive bleeding, could convert to Plavix 75 mg alone

## 2020-02-22 NOTE — Assessment & Plan Note (Signed)
Lipids look pretty good in June.  Should be due for follow-up labs in October.  We will then see him back in February.

## 2020-02-22 NOTE — Assessment & Plan Note (Signed)
Blood pressures well controlled on current meds.  No change

## 2020-02-22 NOTE — Assessment & Plan Note (Addendum)
He had been very active and had not necessarily noted any symptoms besides maybe some progressive dyspnea over the last year.  Now has extensive PCI.  Plan:   Continue current dose of amlodipine-olmesartan and carvedilol as blood pressure seems well controlled.  Continue atorvastatin  Is on aspirin plus Brilinta ->  This is a difficult situation because he is >6 months out now from his multivessel PCI but has significant amount of stents placed.  Would prefer for him to stay on it uninterrupted agents for 1 year (if there is concern for excessive bleeding on Brilinta, can convert to Plavix) -> I suspect that he may very well require major thoracic surgery in which case he will have to hold antiplatelet agent for 7 days preop.  The urgency of his lung cancer treatment supersedes desire for uninterrupted antiplatelet agent for a year.  He has already held for his biopsy and he did well.

## 2020-02-22 NOTE — Telephone Encounter (Signed)
Spoke with [facility] and pt had Pet Scan done today and test is showing in the L Atrium thrombus or possible ca size is 0.8 cm x 0.6 cm Requesting an earlier appt Will forward to Dr Ellyn Hack for review and recommendations ./cy

## 2020-02-23 HISTORY — PX: TRANSTHORACIC ECHOCARDIOGRAM: SHX275

## 2020-02-23 NOTE — Telephone Encounter (Signed)
Needs Echo - & most likely TEE. -> If he can get in to see me in person or via Telemedicine, great, but OK to see APP to schedule TEE.  If + Thrombus - need to convert from ASA/ Brilinta to Plavix & Eliquis (Eliquis per same protocol as DVT-PE).  [name], MD

## 2020-02-27 NOTE — Telephone Encounter (Signed)
Brain normal study.  Did not show any evidence of thrombus.  I think we are okay to stay with January follow-up  [name], MD

## 2020-02-27 NOTE — Telephone Encounter (Signed)
Spoke with patient to schedule appointment . Patient states he had an echo on 02/23/20 to verify  PET SCAN result from 02/22/20.patient states the "woman"  Stated the test looked good RN informed patient will need to obtain  Follow up echo  For Dr Ellyn Hack.     RN informed patient the result echo is available and  It looks like  Result did not show thrombus. Patient aware will discuss with Dr Ellyn Hack. If appointment is needed sooner than 06/18/2020  Patient verbalized understanding.

## 2020-02-28 NOTE — Telephone Encounter (Signed)
Patient aware to keep appt for Jan 2022

## 2020-04-06 DIAGNOSIS — I480 Paroxysmal atrial fibrillation: Secondary | ICD-10-CM

## 2020-04-06 HISTORY — DX: Paroxysmal atrial fibrillation: I48.0

## 2020-04-25 ENCOUNTER — Telehealth: Payer: Self-pay | Admitting: Cardiology

## 2020-04-25 NOTE — Telephone Encounter (Signed)
° ° °  Pt said last weekend he went to [facility]. He said he was given new meds and would like to know if Dr. Ellyn Hack approves those meds before taking it. He said he will give meds to nurse

## 2020-04-25 NOTE — Telephone Encounter (Signed)
Spoke with patient. [facility] was [facility] recently. He reports he was started on Eliquis 5mg  BID, told to discontinue his aspirin and to decrease his carvedilol to 1/2 tab BID. Blood pressure 115/75, HR 109 this morning. He would like Dr. Allison Quarry input as he is already on Brilinta. Will route to MD for review.

## 2020-04-25 NOTE — Telephone Encounter (Signed)
So I looked up his hospitalization on October 22.  He came in with A. fib and pneumonia. I do not understand the IV backing down on carvedilol since he was in A. fib RVR. ->  My preference would be to cut his Azor dose in half and continue current dose of carvedilol.  They stopped his aspirin and continued Brilinta when starting Eliquis. ->  My recommendation would be to convert him to Plavix 75 mg daily and stop Brilinta.  First day of Plavix will be 4 tablets, then 1 tablet daily.  Will discuss with Ivin Booty if ?s.   [name], MD

## 2020-04-26 NOTE — Telephone Encounter (Signed)
Patient is following up. 

## 2020-04-27 MED ORDER — CLOPIDOGREL BISULFATE 75 MG PO TABS
ORAL_TABLET | ORAL | 3 refills | Status: DC
Start: 2020-04-27 — End: 2021-04-29

## 2020-04-27 MED ORDER — AMLODIPINE-OLMESARTAN 5-20 MG PO TABS: 0.5000 | ORAL_TABLET | Freq: Every day | ORAL | 3 refills | Status: DC

## 2020-04-27 NOTE — Telephone Encounter (Signed)
spoke to patient and wife . Patient states he was have a sleep last night when RN gave him instruction las evening.   RN wnt over instruction again with patient and wife.  per Dr Ellyn Hack ,  1) continue with Eliquis 5 mg twice a day   2)Stop Brilinta 90 mg twice a day and Aspirin 81 mg   3) start taking Plavix ( clopidogrel ) 75 mg one tablet daily - the first dose take 300 mg ( which is        4 tablets)   4) decrease dose-  ( amlodipine-olmesartan)Azor 5/20 mg -- Take 1/2 tablet by mouth daily .   Patient's wife repeated instruction and verbalized understanding. Patient stated  ER told him to stop taking Aspirin. Follow appointment in Jun 18, 2020.

## 2020-04-27 NOTE — Telephone Encounter (Signed)
Patient calling back. States he needs someone to go over all of the instructions for the medications so that his wife can write it down.

## 2020-05-01 DIAGNOSIS — Z8719 Personal history of other diseases of the digestive system: Secondary | ICD-10-CM

## 2020-05-01 HISTORY — DX: Personal history of other diseases of the digestive system: Z87.19

## 2020-05-02 DIAGNOSIS — K635 Polyp of colon: Secondary | ICD-10-CM

## 2020-05-02 HISTORY — PX: COLONOSCOPY WITH ESOPHAGOGASTRODUODENOSCOPY (EGD): SHX5779

## 2020-05-02 HISTORY — DX: Polyp of colon: K63.5

## 2020-05-07 HISTORY — PX: OTHER SURGICAL HISTORY: SHX169

## 2020-05-08 HISTORY — PX: ESOPHAGOGASTRODUODENOSCOPY W/ BANDING: SHX1530

## 2020-06-12 ENCOUNTER — Telehealth: Payer: Self-pay | Admitting: Cardiology

## 2020-06-12 MED ORDER — METOPROLOL TARTRATE 25 MG PO TABS
25.0000 mg | ORAL_TABLET | Freq: Two times a day (BID) | ORAL | 0 refills | Status: DC
Start: 2020-06-12 — End: 2020-06-18

## 2020-06-12 NOTE — Telephone Encounter (Signed)
Returned call to pt he states that he was recently in [facility] and they changed his Carvedilol to metoprolol tart. 25mg  BID. Refilled Metoprolol #10 to last until he comes in for follow up appt. Rx sent to requested pharmacy.

## 2020-06-12 NOTE — Telephone Encounter (Signed)
Patient states he was in the hospital about a month ago in [facility]). When he was in the hospital he was taken off some medications and put on others. He is out of the medications but does not come in to see Dr. Ellyn Hack until 06/18/20. He is not sure if he should just go back on his other medications or get refills of the old medications.  Please send refills to pharmacy noted below:   *STAT* If patient is at the pharmacy, call can be transferred to refill team.   1. Which medications need to be refilled? (please list name of each medication and dose if known)   2. Which pharmacy/location (including street and city if local pharmacy) is medication to be sent to? [id number] - [place], [place] - [address] AT [address]  3. Do they need a 30 day or 90 day supply? 30 with refills

## 2020-06-18 ENCOUNTER — Ambulatory Visit (INDEPENDENT_AMBULATORY_CARE_PROVIDER_SITE_OTHER): Payer: Medicare Other | Admitting: Cardiology

## 2020-06-18 ENCOUNTER — Other Ambulatory Visit: Payer: Self-pay

## 2020-06-18 VITALS — BP 130/86 | HR 94 | Ht 73.5 in | Wt 210.8 lb

## 2020-06-18 DIAGNOSIS — I25118 Atherosclerotic heart disease of native coronary artery with other forms of angina pectoris: Secondary | ICD-10-CM | POA: Diagnosis not present

## 2020-06-18 DIAGNOSIS — I10 Essential (primary) hypertension: Secondary | ICD-10-CM

## 2020-06-18 DIAGNOSIS — I4901 Ventricular fibrillation: Secondary | ICD-10-CM

## 2020-06-18 DIAGNOSIS — Z9861 Coronary angioplasty status: Secondary | ICD-10-CM

## 2020-06-18 DIAGNOSIS — I480 Paroxysmal atrial fibrillation: Secondary | ICD-10-CM | POA: Diagnosis not present

## 2020-06-18 DIAGNOSIS — I469 Cardiac arrest, cause unspecified: Secondary | ICD-10-CM | POA: Diagnosis not present

## 2020-06-18 DIAGNOSIS — I2510 Atherosclerotic heart disease of native coronary artery without angina pectoris: Secondary | ICD-10-CM | POA: Diagnosis not present

## 2020-06-18 DIAGNOSIS — Z8719 Personal history of other diseases of the digestive system: Secondary | ICD-10-CM

## 2020-06-18 DIAGNOSIS — N521 Erectile dysfunction due to diseases classified elsewhere: Secondary | ICD-10-CM

## 2020-06-18 DIAGNOSIS — I779 Disorder of arteries and arterioles, unspecified: Secondary | ICD-10-CM

## 2020-06-18 DIAGNOSIS — C3431 Malignant neoplasm of lower lobe, right bronchus or lung: Secondary | ICD-10-CM

## 2020-06-18 DIAGNOSIS — E785 Hyperlipidemia, unspecified: Secondary | ICD-10-CM

## 2020-06-18 MED ORDER — METOPROLOL TARTRATE 50 MG PO TABS: 50.0000 mg | ORAL_TABLET | Freq: Two times a day (BID) | ORAL | 3 refills | Status: DC

## 2020-06-18 MED ORDER — ATORVASTATIN CALCIUM 40 MG PO TABS: 40.0000 mg | ORAL_TABLET | Freq: Every day | ORAL | 3 refills | Status: DC

## 2020-06-18 MED ORDER — APIXABAN 5 MG PO TABS: 5.0000 mg | ORAL_TABLET | Freq: Two times a day (BID) | ORAL | 3 refills | Status: DC

## 2020-06-18 MED ORDER — SILDENAFIL CITRATE 50 MG PO TABS: ORAL_TABLET | ORAL | 11 refills | Status: DC

## 2020-06-18 MED ORDER — AMLODIPINE-OLMESARTAN 5-20 MG PO TABS: 0.5000 | ORAL_TABLET | Freq: Every day | ORAL | 3 refills | Status: DC

## 2020-06-18 NOTE — Patient Instructions (Signed)
Medication Instructions:  Continue taking Eliquis  5 mg  Twice a day  Azor Atorvastatin  plavix allother medications Increase  Metoprolol  Tartrate ( lopressor) 50 mg twice a day   *If you need a refill on your cardiac medications before your next appointment, please call your pharmacy*   Lab Work: Lipid Liver function panel-fasting If you have labs (blood work) drawn today and your tests are completely normal, you will receive your results only by: [name] MyChart Message (if you have MyChart) OR . A paper copy in the mail If you have any lab test that is abnormal or we need to change your treatment, we will call you to review the results.   Testing/Procedures: Not needed   Follow-Up: At [facility], you and your health needs are our priority.  As part of our continuing mission to provide you with exceptional heart care, we have created designated Provider Care Teams.  These Care Teams include your primary Cardiologist (physician) and Advanced Practice Providers (APPs -  Physician Assistants and Nurse Practitioners) who all work together to provide you with the care you need, when you need it.  We recommend signing up for the patient portal called "MyChart".  Sign up information is provided on this After Visit Summary.  MyChart is used to connect with patients for Virtual Visits (Telemedicine).  Patients are able to view lab/test results, encounter notes, upcoming appointments, etc.  Non-urgent messages can be sent to your provider as well.   To learn more about what you can do with MyChart, go to [email or web address].    Your next appointment:   3 month(s)  The format for your next appointment:   In Person  Provider:   [name], MD   Other Instructions

## 2020-06-18 NOTE — Progress Notes (Signed)
Primary Care Provider: Patient, No Pcp Per Cardiologist: [name], MD Electrophysiologist: None  Clinic Note: Chief Complaint  Patient presents with  . Hospitalization Follow-up    Follow-up from several hospitalizations-listed below; most recent GI bleed  . Coronary Artery Disease    No angina, despite significant anemia  . Atrial Fibrillation    Diagnosed April 06, 2020  . Follow-up    Routine 8-monthfollow-up, but with a very extensive interim   Problem List Items Addressed This Visit    Cardiac arrest with ventricular fibrillation (HCC) -> 20 minutes CPR with ROSC (Chronic)   Multiple vessel coronary artery disease - Primary (Chronic)CAD S/P 4 V PCI (Chronic)   Essential hypertension (Chronic)   Hyperlipidemia with target LDL less than 70 (Chronic)     HPI:    John ALLSBROOKis a 70y.o. male with a PMH notable for multivessel CAD (multivessel PCI following STEMI with cardiac arrest after COVID-19 shock), , Recent Diagnosis of Squamous Cell Lung Cancer followed by new Dx of PAF, who presents today [facility]John Lingerwas last seen on 02/15/2020, shortly after his diagnosis of lung cancer.  He says that he walks back and forth to the local school which is about a mile away, and then spends quite a time eating all falls.  At that time he had cough but no recurrent hemoptysis.  Maybe little exertional dyspnea with exercise tolerance, doing better.  No chest pain or pressure.  No medication changes made   Recent Hospitalizations:   [facility]  ==> CT Chest w/ RLL mass concerning for Primary Lung Malignancy:   February 09, 2020: (Bronchoscopy) ->Dx SCCa,  pulmonary (poorly differentiated)-right lower lobe T3, N2, M0  February 20, 2020 staging MRI  - NO intracranial metastasis.  Minimal chronic small vessel ischemic disease in the  brain.  April 06, 2020-[facility] -> found to be in A. fib.  Started on Eliquis along with Plavix.  November 16-25, 2021-[facility]: ADMIT - GI BLEED . Acure BL Anemia -->   Found to have small bowel AVMs.  >  Transfuse 4 units PRBC  EGD/C-Scope 05/02/2020 - no evidence of acute bleeding.   (IR Mesenteric Arteriogram (11/22)  EGD 11/23: AVM Cauterization  Plan was for OP C-Scope to evaluate Polyps. .  Cleared by GI to resume Antiplatelet/AC meds (restarted on ASA/Plavix x 1 month, then d/c ASA & restart Eliquis).   Carvedilol replaced w/ Lopressor due to variable rates.  Amlodipine d/c b/c Hypotension. .  D/C Meds: Pantoprazole 40 mg daily, metoprolol 25 mg twice daily, FeSO4 325 mg daily,  Plavix 75 mg and Eliquis 5 mg twice daily, the Lipitor 40 mg daily, multivitamin, senna, Flomax,  Compazine as needed  Reviewed  CV studies:    The following studies were reviewed today: (if available, images/films reviewed: From Epic Chart or Care Everywhere)  ECHO ([facility]) dated 02/23/2020 :normal LV systolic function with an EF of 55-60%. The left atrium was mildly dilated.   Trivial pericardial effusion.  Interval History:   John HILLMERreturns today overall doing from a cardiac standpoint.  He tolerated extreme anemia without having any anginal or CHF symptoms.  He is still quite tired and worn out, but is gradually building back to his energy level.  He is not quite yet back to doing his full exercising, but he  is walking some.  He notes that his cough is improving some-still producing little bit of clear phlegm but no hemoptysis.  He denies any fevers or chills.  He does get a little dizzy and woozy, but this is improved the father of the hospital he gets --> getting less and less unstable and dizzy.[name]  He does get little dizzy when he has coughing or straining.  His appetite is slowly improving and he denies any melena, hematochezia or hematuria.  He says that 1 episode of A. fib, he  really has not noticed any significant increases in heart rates or abnormal basis.  CV Review of Symptoms (Summary): positive for - He still has exertional dyspnea, little bit of unsteady gait and dizziness, but no syncope or near syncope. negative for - chest pain, edema, irregular heartbeat, orthopnea, palpitations, paroxysmal nocturnal dyspnea, rapid heart rate or TIA/amaurosis fugax, claudication  The patient does not have symptoms concerning for COVID-19 infection (fever, chills, cough, or new shortness of breath).   REVIEWED OF SYSTEMS   Review of Systems  Constitutional: Positive for malaise/fatigue (Energy slowly improving ). Negative for weight loss (wgt gain since June -> not as active).  HENT: Negative for congestion and nosebleeds.   Respiratory: Positive for cough and shortness of breath (a bit - with exertion). Negative for sputum production.   Gastrointestinal: Negative for blood in stool (None in the last ~3 weeks), melena, nausea and vomiting.  Musculoskeletal: Positive for joint pain. Negative for falls.  Neurological: Positive for dizziness and weakness (generalized). Negative for tingling, tremors and focal weakness.  Endo/Heme/Allergies: Negative for environmental allergies.  Psychiatric/Behavioral: Negative for depression (In very good spirits given the circumstances) and memory loss. The patient is not nervous/anxious and does not have insomnia.    I have reviewed and (if needed) personally updated the patient's problem list, medications, allergies, past medical and surgical history, social and family history.   PAST MEDICAL HISTORY   Past Medical History:  Diagnosis Date  . CAD S/P 4V PCI 08/10/2019   08/09/19: CULPRIT: m-dLCx 80%--OM2 100% @ small branch take-off. --> DES PCI: 2.25 mm x 28 mm Synergy DES (2.75 mm);  pRCA 50%-80% tandem lesions --> DES PCI: 3.5 mm x 24 mm Synergy DES (4.1 mm) 08/29/19: Staged Bifurcation PCI of p-dLAD_0  with CSI atherectomy and  scoring low PTA of ostial D1-  (4 total stents, initial stent from LAD into D1 (Synergy 2.75 x 16-2.9 mm, with 3 overlapping stents in L  . Cardiac arrest with ventricular fibrillation (HCC) -> 20 minutes CPR with ROSC 08/09/2019   15 minutes following second COVID-19 vaccination -> vasovagal episode during the hypotension followed by VF arrest; found to have 4 vessel CAD on cath with culprit 100% OM 3--now status post four-vessel PCI  . Colon polyps 05/02/2020   [facility]) -colonoscopy performed to evaluate for acute GI bleed, polyps found.  Plan for outpatient colonoscopy.  . H/O: upper GI bleed 05/01/2020   Admitted with acute blood loss anemia-4+ units PRBC.  Provocative bleeding scan-small bowel AVMs s/p Cauterization 05/08/2020  . Multiple vessel coronary artery disease 08/10/2019   Heart Cath -PCI 08/09/2019:  CULPRIT LESION: Mid Cx to Dist Cx lesion is 80% stenosed. 3rd Mrg lesion is 100% stenosed at small branch take-off. DES PCI: 2.25 mm x 28 mm Synergy DES (proximal postdilated 2.75 mm.  Post intervention, there is a 0% residual stenosis.  Small side branch was occluded Ost RCA to Prox RCA lesion is 50% stenosed.Prox RCA lesion  is 80% stenosed.  DES PCI: 3.5 mm x 24 mm Sy  . Paroxysmal atrial fibrillation ([place]) 04/06/2020   Presented to [facility] -in A. fib RVR   . Squamous cell carcinoma of bronchus in right lower lobe ([place]) 01/2020   Seen on CT scan 01/28/2020-status post bronchoscopy with biopsy -> pathology T3, N2, M0 poorly differentiated; MRI Brain - NO intracranial Mets.  . Sustained ventricular fibrillation ([place]) 08/09/2019   In setting of acute MI    February 09, 2020: Bronchoscopy with EBUS:   Pathology revealed poorly differentiated keratinizing squamous cell carcinoma   Staging MRI of the brain 02/20/2020 revealed no evidence of intracranial metastases. There was minimal chronic small vessel ischemic disease.  PET/CT scan dated 02/22/2020 revealed a right infrahilar mass with  epicenter in the right lower lobe associated with obstruction of the bronchus intermedius probably partially from tumor and partially from mucous plugging. The mass was hypermetabolic with maximum SUV 19.0 and was associated with postobstructive atelectasis of the right lower lobe. The hypermetabolic activity of the mass measured approximately 3.9 x 3.4 x 5.8 cm. The mass abutted the pleural pericardial margin of the medial right lower lobe and may be mildly invading the mediastinal adipose tissue in this vicinity. The mass abutted left atrium and on prior CT chest of 01/28/2020 there was potentially a 0.8 x 0.6 cm adjacent filling defect in the left atrium which could be from thrombus or early invasion. Right upper paratracheal lymph node measured 1 cm in short axis, maximum SUV 2.3, and subcarinal node 1.3 cm in short axis with maximum SUV 3.1.   PAST SURGICAL HISTORY   Past Surgical History:  Procedure Laterality Date  . COLONOSCOPY WITH ESOPHAGOGASTRODUODENOSCOPY (EGD)  05/02/2020   [facility]: No evidence of acute bleeding, colon polyps noted.  . CORONARY ATHERECTOMY N/A 08/29/2019   Procedure: CORONARY ATHERECTOMY;  Surgeon: Leonie Man, MD;  Location: [place] [facility];  Service: Cardiovascular; SCORING BALLOON ANGIOPLASTY- ostial D1, CSI ATHERECTOMY p-mLAD -> followed by bifurcation stenting  . CORONARY STENT INTERVENTION N/A 08/09/2019   Procedure: CORONARY STENT INTERVENTION;  Surgeon: Lorretta Harp, MD::  2V PCI: Culprit lesion ~100% OM3 (with 80% main LCx) --> DES PCI covering both lesions-Synergy DES 2.25 mm x 28 mm, postdilated to 2.75 mm; LESION #2 proximal RCA 50% followed by 80% --> DES PCI covering both-Synergy DES 3.5 mm x 24 mm  . CORONARY STENT INTERVENTION N/A 08/29/2019   Procedure: CORONARY STENT INTERVENTION;  Surgeon: Leonie Man, MD:: Bifurcation DES PCI, Reverse Culotte Technique W/ KB (4 total SYNERGY DES stents, 1st (2.75X16 - 2.9 MM) - pLAD-ostD1 & 3 overlapping  in LAD (3.0 x 38, 3.0 x 16, 2.75 x16 - tapered POT postdilation 3.6-3.1 & apical LAD PTA only.  . CYSTOSCOPY N/A 08/18/2019   Procedure: CYSTOSCOPY CLOT EVACUATION FULGURATION 0.5-2 CM.;  Surgeon: Festus Aloe, MD;  Location: [place];  Service: Urology;  Laterality: N/A;  . ESOPHAGOGASTRODUODENOSCOPY W/ BANDING  05/08/2020   [facility]: identified Small Bowel AVMs - > Rx with Cauterization  . LEFT HEART CATH AND CORONARY ANGIOGRAPHY N/A 08/09/2019   Procedure: RIGHT & LEFT HEART CATH AND CORONARY ANGIOGRAPHY;  Surgeon: Lorretta Harp, MD;  Location: [place] [facility]:: post-arrest : 80% LCx-100% OM3 (DES PCI), pRCA tandem 50 & 80% (DES PCI), Bifurcation LAD (75% calcified)-ostial D1 95%.  LVEDP 11 mmHg, PCWP 15 mmHg.   [name] MESENTERIC ARTERIOGRAM  05/07/2020   [facility]: No evidence of bleed or target ASM for  treatment (Presented with ABLA - UGIB)   . TRANSTHORACIC ECHOCARDIOGRAM  08/10/2019    Post cardiac arrest: EF 55 to 60%.  No R WMA.  GR 1 DD.  Normal RV.  Normal valves.  . TRANSTHORACIC ECHOCARDIOGRAM  02/23/2020   normal LV systolic function with an EF of 55-60%. The left atrium was mildly dilated.   Trivial pericardial effusion.  [name] VIDEO BRONCHOSCOPY WITH RADIAL ENDOBRONCHIAL ULTRASOUND  02/09/2020   [facility]: Dr. Hilton Cork => for RLL Lung mass: Prominent adenopathy in the paratracheal (2R-4R) station, subcarinal station(7), and 11 L.  Complex adenopathy/Mass in R [place] (unable to determine nodes versus direct tumor invasion). EBUS-transBronch Needle Asp @ 11L, 7 & 2R LNs-> No evidence of Malignancy; TO of Bronchus Intermedius w/ both endobronchial tumor & extrinsic compression. Bx taken.   08/09/2019 (post arrest - Imf STEMI)     Staged LAD PCI 08/29/2019     Immunization History  Administered Date(s) Administered  . PFIZER SARS-COV-2 Vaccination 07/19/2019, 08/09/2019    MEDICATIONS/ALLERGIES   Current Meds  Medication Sig  . ascorbic acid (VITAMIN C) 500 MG tablet 1  tab(s)  . clopidogrel (PLAVIX) 75 MG tablet ( only the first day take 4 tablets) then start 75 mg tablet by mouth  daily  . Multiple Vitamin (MULTIVITAMIN ADULT PO) Take 2 tablets by mouth daily.  . prochlorperazine (COMPAZINE) 10 MG tablet TAKE 1 TABLET(10 MG) BY MOUTH EVERY 6 HOURS AS NEEDED FOR NAUSEA  . senna-docusate (SENOKOT-S) 8.6-50 MG tablet Take by mouth.  . tamsulosin (FLOMAX) 0.4 MG CAPS capsule Take 1 capsule (0.4 mg total) by mouth daily after supper. (Patient taking differently: Take 0.4 mg by mouth daily.)  . triamcinolone (KENALOG) 0.1 % Apply topically 2 (two) times daily.  . [DISCONTINUED] acetaminophen (TYLENOL) 500 MG tablet Take 1,000 mg by mouth every 6 (six) hours as needed for moderate pain or headache.  . [DISCONTINUED] apixaban (ELIQUIS) 5 MG TABS tablet Take 5 mg by mouth 2 (two) times daily.  . [DISCONTINUED] atorvastatin (LIPITOR) 40 MG tablet Take 1 tablet (40 mg total) by mouth daily.  . [DISCONTINUED] metoprolol tartrate (LOPRESSOR) 25 MG tablet Take 1 tablet (25 mg total) by mouth 2 (two) times daily.  . [DISCONTINUED] metoprolol tartrate (LOPRESSOR) 50 MG tablet Take 1 tablet (50 mg total) by mouth 2 (two) times daily.  . [DISCONTINUED] pantoprazole (PROTONIX) 40 MG tablet 1 tab(s)  . [DISCONTINUED] sildenafil (VIAGRA) 50 MG tablet Take 1 to 2 tablet by mouth daily as needed for erectile dysfunction.    No Known Allergies  SOCIAL HISTORY/FAMILY HISTORY   Reviewed in Epic:  Pertinent findings:  Social History   Tobacco Use  . Smoking status: Former Smoker    Packs/day: 1.50    Types: Cigarettes    Quit date: 08/09/2019    Years since quitting: 0.8  . Smokeless tobacco: Never Used  Vaping Use  . Vaping Use: Never used  Substance Use Topics  . Alcohol use: Yes    Alcohol/week: 4.0 standard drinks    Types: 4 Cans of beer per week    Comment: [place]  . Drug use: Never   Social History   Social History Narrative  . Not on file    OBJCTIVE -PE,  EKG, labs   Wt Readings from Last 3 Encounters:  06/18/20 210 lb 12.8 oz (95.6 kg)  02/15/20 202 lb (91.6 kg)  12/12/19 199 lb 3.2 oz (90.4 kg)    Physical Exam: BP 130/86   Pulse 94  Ht 6' 1.5" (1.867 m)   Wt 210 lb 12.8 oz (95.6 kg)   SpO2 100%   BMI 27.43 kg/m  Physical Exam Vitals reviewed.  Constitutional:      General: He is not in acute distress.    Appearance: Normal appearance. He is normal weight. He is not ill-appearing or toxic-appearing.     Comments: Remarkably healthy-appearing.  Well-groomed.  Well-nourished.  HENT:     Head: Normocephalic and atraumatic.  Neck:     Vascular: No carotid bruit, hepatojugular reflux or JVD.  Cardiovascular:     Rate and Rhythm: Normal rate and regular rhythm.  No extrasystoles are present.    Chest Wall: PMI is not displaced.     Pulses: Normal pulses.     Heart sounds: S1 normal and S2 normal. Heart sounds are distant. No murmur heard. No friction rub. No gallop.   Pulmonary:     Effort: Pulmonary effort is normal. No respiratory distress.     Comments: Right lower lobe rhonchi with expiratory wheeze Chest:     Chest wall: No tenderness.  Musculoskeletal:        General: No swelling. Normal range of motion.     Cervical back: Normal range of motion and neck supple.  Skin:    General: Skin is warm and dry.  Neurological:     General: No focal deficit present.     Mental Status: He is alert and oriented to person, place, and time. Mental status is at baseline.  Psychiatric:        Mood and Affect: Mood normal.        Behavior: Behavior normal.        Thought Content: Thought content normal.        Judgment: Judgment normal.     Comments: Despite everything going on, he still has a jovial mood.      Adult ECG Report  Rate: 94 ;  Rhythm: normal sinus rhythm; normal axis, intervals & durations  Narrative Interpretation: normal EKG   Recent Labs:  reviewed  Lab Results  Component Value Date   CHOL 80 (L)  12/08/2019   HDL 34 (L) 12/08/2019   LDLCALC 33 12/08/2019   TRIG 49 12/08/2019   CHOLHDL 2.4 12/08/2019   CBC and Differential Component 06/11/20 05/29/20 05/28/20 05/10/20 05/09/20  WBC 3.4 2.6Low 3.1Low 2.1Low 2.0Low  RBC 3.44 2.63Low 2.76Low 2.62Low 2.81Low  Hemoglobin 9.3 7.0Low 7.4Low 7.3Low 7.8Low  Hematocrit 29.6 22.6Low 24.1Low 22.4Low 23.8Low  MCV 86.1 85.9 87.3 85.5 84.8  MCH 27.0 26.7Low 26.8Low 27.9 28.0  MCHC 31.4 31.1Low 30.7Low 32.7Low 33.0  RDW 20.8 18.9 19.2 17.5High 17.0  Platelets 296 237 249 183 179   Comprehensive metabolic panel Component 15/17/61 05/29/20 05/28/20  Sodium 139 138 139  Potassium 4.0 3.9 4.2  Chloride 106 106 106  CO2 _0 BUN _1 Glucose 113High  162  101High   Creatinine 0.76 0.78 0.71  Calcium 9.3 9.0 9.2  Total Protein 6.7  6.6  6.4   Albumin  3.8 3.7 3.6  Total Bilirubin 0.4  0.4  0.3   Alkaline Phosphatase 93 81 76  AST (SGOT) _2 ALT (SGPT) _3 TSH, 3rd Generation Component 06/11/20 04/07/20  TSH 0.351 0.690   ASSESSMENT/PLAN    Problem List Items Addressed This Visit    Cardiac arrest with ventricular fibrillation (HCC) -> 20 minutes CPR with ROSC (Chronic)    We  are now 10 months of his cardiac arrest with no residual negative effects.  EF seems to still be preserved despite her multiple CAD.  No neurologic deficits post PCI no signs or symptoms of CHF.  He had a very short-term cardiogenic shock with [place] sun.  With normal EF, no need for ICD.  Follow-up echocardiogram shows EF 55 to 60%.  He is now on metoprolol as well as carvedilol, converted because of PAF.  Will increase dose.      Relevant Medications   atorvastatin (LIPITOR) 40 MG tablet   Other Relevant Orders   EKG 12-Lead (Completed)   Hepatic function panel   Lipid panel   Multiple vessel coronary artery disease - Primary (Chronic)    Multivessel CAD found in the  setting of cardiac arrest post CPR with inferior STEMI noted. Now status post multivessel PCI.  Plan:  Continue metoprolol, will increase dose to 50 mg twice daily in light of resting heart rate 94 bpm and recent diagnosis of A. Fib.  For now,  continue current dose of amlodipine-olmesartan 5-20 mg -> would prefer to use higher dose beta-blocker then increase this dose if additional BP control needed  Continue atorvastatin 40 mg  With extensive PCI, I do feel it is important to continue on Plavix, but no longer requires aspirin, especially since he is now on Eliquis      Relevant Medications   atorvastatin (LIPITOR) 40 MG tablet   CAD S/P 4 V PCI (Chronic)    Extensive PCI of all 3 major arteries and branch diagonal vessel.  Complicated LAD-diagonal PCI.  Would prefer to keep him on lifelong antiplatelet agent.  We have de-escalated to Plavix 75 mg monotherapy due to the addition of Eliquis for A. Fib.  Aspirin discontinued.   Would like to continue uninterrupted Plavix through February 2022, at that point, would be okay to hold Plavix 5 days preop for procedures or surgeries.  Would also be okay to temporarily hold Plavix if there is recurrent GI bleed      Relevant Medications   atorvastatin (LIPITOR) 40 MG tablet   Other Relevant Orders   EKG 12-Lead (Completed)   Hepatic function panel   Lipid panel   PAF (paroxysmal atrial fibrillation) (HCC) (Chronic)    Newly diagnosed in October.  I suspect that this may be partly related to his lung cancer along with CAD.  He tolerated A. fib with RVR (as well as significant anemia) with no angina, so do not feel additional ischemic evaluation required.  EF preserved on echo. Thankfully, he has not had any breakthrough episodes.  No need to consider antiarrhythmics at this time.  Plan:   Continue with beta-blocker-switch from carvedilol to metoprolol for better rate control, we will increase to 50 mg twice daily.  He has completed  his 1 month of aspirin and Plavix, aspirin now stopped and he is back on Plavix plus Eliquis--refill Eliquis.      Relevant Medications   atorvastatin (LIPITOR) 40 MG tablet   H/O: upper GI bleed: Small Bowell AVMs - Cauterized (Chronic)    GI bleed requiring blood transfusion.  Last hemoglobin was getting back up to somewhat normal range, still anemic.  Recently had iron infusions.  Will defer to hematology/oncology and PCP.  Now back on Plavix monotherapy along with Eliquis.  Aspirin discontinued.      Essential hypertension (Chronic)    Borderline blood pressure today.  No longer on carvedilol which has been more antihypertensive effect.  Plan: Increase metoprolol to 50 mg twice daily along with Azor 5-20 mg daily      Relevant Medications   atorvastatin (LIPITOR) 40 MG tablet   Erectile dysfunction due to arterial disease (HCC) (Chronic)    Refill sildenafil.      Hyperlipidemia with target LDL less than 70 (Chronic)    Lipids were last checked in June.  Due for follow-up labs to be checked.  Continue atorvastatin 40 daily.      Relevant Medications   atorvastatin (LIPITOR) 40 MG tablet   Other Relevant Orders   Hepatic function panel   Lipid panel   Squamous cell carcinoma of bronchus in right lower lobe (HCC) (Chronic)    No further hemoptysis.  Currently undergoing chemotherapy treatments.  Not likely surgical candidate due to location of the mass.  Again, there is concern about hemoptysis in the future, would hold both Eliquis and Plavix until bleeding stops.      Relevant Medications   prochlorperazine (COMPAZINE) 10 MG tablet       COVID-19 Education: The signs and symptoms of COVID-19 were discussed with the patient and how to seek care for testing (follow up with PCP or arrange E-visit).   The importance of social distancing and COVID-19 vaccination was discussed today. The patient is practicing social distancing & Masking.   I spent a total of 45  minutes with the patient spent in direct patient consultation.  Additional time spent with chart review  / charting (studies, outside notes, etc): 30 min Total Time: 75 min  Current medicines are reviewed at length with the patient today.  (+/- concerns) n/a  This visit occurred during the SARS-CoV-2 public health emergency.  Safety protocols were in place, including screening questions prior to the visit, additional usage of staff PPE, and extensive cleaning of exam room while observing appropriate contact time as indicated for disinfecting solutions.  Notice: This dictation was prepared with Dragon dictation along with smaller phrase technology. Any transcriptional errors that result from this process are unintentional and may not be corrected upon review.  Patient Instructions / Medication Changes & Studies & Tests Ordered   Patient Instructions  Medication Instructions:  Continue taking Eliquis  5 mg  Twice a day  Azor Atorvastatin  plavix allother medications Increase  Metoprolol  Tartrate ( lopressor) 50 mg twice a day   *If you need a refill on your cardiac medications before your next appointment, please call your pharmacy*   Lab Work: Lipid Liver function panel-fasting If you have labs (blood work) drawn today and your tests are completely normal, you will receive your results only by: [name] MyChart Message (if you have MyChart) OR . A paper copy in the mail If you have any lab test that is abnormal or we need to change your treatment, we will call you to review the results.   Testing/Procedures: Not needed   Follow-Up: At [facility], you and your health needs are our priority.  As part of our continuing mission to provide you with exceptional heart care, we have created designated Provider Care Teams.  These Care Teams include your primary Cardiologist (physician) and Advanced Practice Providers (APPs -  Physician Assistants and Nurse Practitioners) who all work  together to provide you with the care you need, when you need it.  We recommend signing up for the patient portal called "MyChart".  Sign up information is provided on this After Visit Summary.  MyChart is used to connect with patients for  Virtual Visits (Telemedicine).  Patients are able to view lab/test results, encounter notes, upcoming appointments, etc.  Non-urgent messages can be sent to your provider as well.   To learn more about what you can do with MyChart, go to [email or web address].    Your next appointment:   3 month(s)  The format for your next appointment:   In Person  Provider:   [name], MD   Other Instructions    Studies Ordered:   Orders Placed This Encounter  Procedures  . Hepatic function panel  . Lipid panel  . EKG 12-Lead     [name], M.D., M.S. Interventional Cardiologist   Pager # [phone] Phone # [phone] [address] [place], [place] [address]   Thank you for choosing [facility] at [facility] [place]!!

## 2020-06-19 ENCOUNTER — Other Ambulatory Visit: Payer: Self-pay | Admitting: Cardiology

## 2020-06-19 ENCOUNTER — Telehealth: Payer: Self-pay | Admitting: Cardiology

## 2020-06-19 MED ORDER — METOPROLOL TARTRATE 50 MG PO TABS: 50.0000 mg | ORAL_TABLET | Freq: Two times a day (BID) | ORAL | 3 refills | Status: DC

## 2020-06-19 MED ORDER — APIXABAN 5 MG PO TABS: 5.0000 mg | ORAL_TABLET | Freq: Two times a day (BID) | ORAL | 3 refills | Status: DC

## 2020-06-19 MED ORDER — SILDENAFIL CITRATE 50 MG PO TABS: ORAL_TABLET | ORAL | 11 refills | Status: DC

## 2020-06-19 MED ORDER — AMLODIPINE-OLMESARTAN 5-20 MG PO TABS: 0.5000 | ORAL_TABLET | Freq: Every day | ORAL | 3 refills | Status: DC

## 2020-06-19 NOTE — Telephone Encounter (Signed)
 *  STAT* If patient is at the pharmacy, call can be transferred to refill team.   1. Which medications need to be refilled? (please list name of each medication and dose if known) apixaban (ELIQUIS) 5 MG TABS tablet metoprolol tartrate (LOPRESSOR) 50 MG tablet metoprolol tartrate (LOPRESSOR) 50 MG tablet amLODipine-olmesartan (AZOR) 5-20 MG tablet  2. Which pharmacy/location (including street and city if local pharmacy) is medication to be sent to? [place], [place], [place] [id number]  3. Do they need a 30 day or 90 day supply? 90 day supply  Patient states on 06/18/20 he went to his local pharmacy ([facility], [place]) to pick up prescriptions sent in after his appointment with Dr. Ellyn Hack. However, per patient, the pharmacy has been closed due to the weather. He states he is completely out of the Eliquis and Metoprolol and he would like to have the prescription transferred to the pharmacy listed above if possible. Please assist.

## 2020-06-19 NOTE — Telephone Encounter (Signed)
Prescriptions sent to the [facility] . Pt called to let him know.

## 2020-06-19 NOTE — Addendum Note (Signed)
Addended by: Beatrix Fetters on: 06/19/2020 11:44 AM   Modules accepted: Orders

## 2020-06-20 ENCOUNTER — Telehealth: Payer: Self-pay | Admitting: Cardiology

## 2020-06-20 NOTE — Telephone Encounter (Signed)
Pt c/o medication issue:  1. Name of Medication:  amLODipine-olmesartan (AZOR) 5-20 MG tablet    2. How are you currently taking this medication (dosage and times per day)? Patient states he took a whole pill last night although the directions state that he should only take 0.5 mg.  3. Are you having a reaction (difficulty breathing--STAT)? No.  4. What is your medication issue? Patient states that since he took this medication last night that his blood pressure was low, with a reading of 97/57 HR 117.

## 2020-06-20 NOTE — Telephone Encounter (Signed)
Returned call to patient of Dr. Ellyn Hack who reports taking whole tab of almodipine-olmesartan instead of a half tablet and his BP is low today. He states she was off schedule taking his meds and took this pill last night. He also takes metoprolol tartrate 50mg  BID (dose was increased at 06/18/20 visit).   Advised that he hydrate, rest today, change positions slowly. Check BP/pulse before his PM dose of metoprolol is due and if SBP under 100, take previous dose of 25mg  tonight with plans to resume all meds at normal doses tomorrow - amlodipine-olmesartan in AM, eliquis BID, metoprolol tart 50mg  BID, plavix QD, atorvastatin QD

## 2020-06-21 NOTE — Telephone Encounter (Signed)
Spoke with patient who reports his SBP was 101-102 this morning. He has not taken medications. He plans to eat and will check BP again after. Advised to hydrate, eat, and rest before checking BP. If SBP hovering around 100, he may need to call back for parameters, especially given that HR was around 100 and meto tartrate was just increased at last visit.

## 2020-06-21 NOTE — Telephone Encounter (Signed)
Lets make sure that he only takes 1/2 tab of amlodipine-olmesartan --  Agree with plan.   [name], MD

## 2020-06-24 ENCOUNTER — Encounter: Payer: Self-pay | Admitting: Cardiology

## 2020-06-24 DIAGNOSIS — I779 Disorder of arteries and arterioles, unspecified: Secondary | ICD-10-CM | POA: Insufficient documentation

## 2020-06-24 DIAGNOSIS — N521 Erectile dysfunction due to diseases classified elsewhere: Secondary | ICD-10-CM | POA: Insufficient documentation

## 2020-06-24 NOTE — Assessment & Plan Note (Signed)
No further hemoptysis.  Currently undergoing chemotherapy treatments.  Not likely surgical candidate due to location of the mass.  Again, there is concern about hemoptysis in the future, would hold both Eliquis and Plavix until bleeding stops.

## 2020-06-24 NOTE — Assessment & Plan Note (Signed)
Multivessel CAD found in the setting of cardiac arrest post CPR with inferior STEMI noted. Now status post multivessel PCI.  Plan:  Continue metoprolol, will increase dose to 50 mg twice daily in light of resting heart rate 94 bpm and recent diagnosis of A. Fib.  For now,  continue current dose of amlodipine-olmesartan 5-20 mg -> would prefer to use higher dose beta-blocker then increase this dose if additional BP control needed  Continue atorvastatin 40 mg  With extensive PCI, I do feel it is important to continue on Plavix, but no longer requires aspirin, especially since he is now on Eliquis

## 2020-06-24 NOTE — Assessment & Plan Note (Signed)
Newly diagnosed in October.  I suspect that this may be partly related to his lung cancer along with CAD.  He tolerated A. fib with RVR (as well as significant anemia) with no angina, so do not feel additional ischemic evaluation required.  EF preserved on echo. Thankfully, he has not had any breakthrough episodes.  No need to consider antiarrhythmics at this time.  Plan:   Continue with beta-blocker-switch from carvedilol to metoprolol for better rate control, we will increase to 50 mg twice daily.  He has completed his 1 month of aspirin and Plavix, aspirin now stopped and he is back on Plavix plus Eliquis--refill Eliquis.

## 2020-06-24 NOTE — Assessment & Plan Note (Signed)
Extensive PCI of all 3 major arteries and branch diagonal vessel.  Complicated LAD-diagonal PCI.  Would prefer to keep him on lifelong antiplatelet agent.  We have de-escalated to Plavix 75 mg monotherapy due to the addition of Eliquis for A. Fib.  Aspirin discontinued.   Would like to continue uninterrupted Plavix through February 2022, at that point, would be okay to hold Plavix 5 days preop for procedures or surgeries.  Would also be okay to temporarily hold Plavix if there is recurrent GI bleed

## 2020-06-24 NOTE — Assessment & Plan Note (Signed)
GI bleed requiring blood transfusion.  Last hemoglobin was getting back up to somewhat normal range, still anemic.  Recently had iron infusions.  Will defer to hematology/oncology and PCP.  Now back on Plavix monotherapy along with Eliquis.  Aspirin discontinued.

## 2020-06-24 NOTE — Assessment & Plan Note (Signed)
Lipids were last checked in June.  Due for follow-up labs to be checked.  Continue atorvastatin 40 daily.

## 2020-06-24 NOTE — Assessment & Plan Note (Signed)
We are now 10 months of his cardiac arrest with no residual negative effects.  EF seems to still be preserved despite her multiple CAD.  No neurologic deficits post PCI no signs or symptoms of CHF.  He had a very short-term cardiogenic shock with [place].  With normal EF, no need for ICD.  Follow-up echocardiogram shows EF 55 to 60%.  He is now on metoprolol as well as carvedilol, converted because of PAF.  Will increase dose.

## 2020-06-24 NOTE — Assessment & Plan Note (Signed)
Refill sildenafil.

## 2020-06-24 NOTE — Assessment & Plan Note (Signed)
Borderline blood pressure today.  No longer on carvedilol which has been more antihypertensive effect.  Plan: Increase metoprolol to 50 mg twice daily along with Azor 5-20 mg daily

## 2020-09-12 LAB — HEPATIC FUNCTION PANEL
ALT: 13 IU/L (ref 0–44)
AST: 15 IU/L (ref 0–40)
Albumin: 4.2 g/dL (ref 3.8–4.8)
Alkaline Phosphatase: 109 IU/L (ref 44–121)
Bilirubin Total: 0.4 mg/dL (ref 0.0–1.2)
Bilirubin, Direct: 0.14 mg/dL (ref 0.00–0.40)
Total Protein: 7.1 g/dL (ref 6.0–8.5)

## 2020-09-12 LAB — LIPID PANEL
Chol/HDL Ratio: 3.1 ratio (ref 0.0–5.0)
Cholesterol, Total: 110 mg/dL (ref 100–199)
HDL: 35 mg/dL — ABNORMAL LOW (ref >39)
LDL Chol Calc (NIH): 59 mg/dL (ref 0–99)
Triglycerides: 76 mg/dL (ref 0–149)
VLDL Cholesterol Cal: 16 mg/dL (ref 5–40)

## 2020-09-19 ENCOUNTER — Encounter: Payer: Self-pay | Admitting: Cardiology

## 2020-09-19 ENCOUNTER — Ambulatory Visit: Payer: Medicare Other | Admitting: Cardiology

## 2020-09-19 ENCOUNTER — Other Ambulatory Visit: Payer: Self-pay

## 2020-09-19 VITALS — BP 130/82 | HR 89 | Ht 73.0 in | Wt 206.8 lb

## 2020-09-19 DIAGNOSIS — I469 Cardiac arrest, cause unspecified: Secondary | ICD-10-CM

## 2020-09-19 DIAGNOSIS — I25118 Atherosclerotic heart disease of native coronary artery with other forms of angina pectoris: Secondary | ICD-10-CM

## 2020-09-19 DIAGNOSIS — I4901 Ventricular fibrillation: Secondary | ICD-10-CM

## 2020-09-19 DIAGNOSIS — Z7901 Long term (current) use of anticoagulants: Secondary | ICD-10-CM | POA: Diagnosis not present

## 2020-09-19 DIAGNOSIS — I480 Paroxysmal atrial fibrillation: Secondary | ICD-10-CM

## 2020-09-19 DIAGNOSIS — I10 Essential (primary) hypertension: Secondary | ICD-10-CM

## 2020-09-19 DIAGNOSIS — Z9861 Coronary angioplasty status: Secondary | ICD-10-CM | POA: Diagnosis not present

## 2020-09-19 DIAGNOSIS — E785 Hyperlipidemia, unspecified: Secondary | ICD-10-CM

## 2020-09-19 DIAGNOSIS — I2510 Atherosclerotic heart disease of native coronary artery without angina pectoris: Secondary | ICD-10-CM | POA: Diagnosis not present

## 2020-09-19 MED ORDER — METOPROLOL TARTRATE 50 MG PO TABS: 50.0000 mg | ORAL_TABLET | Freq: Two times a day (BID) | ORAL | 3 refills | Status: DC

## 2020-09-19 NOTE — Progress Notes (Signed)
Primary Care Provider: Patient, No Pcp Per (Inactive) Cardiologist: John Hew, MD Electrophysiologist: None  Clinic Note: Chief Complaint  Patient presents with  . Follow-up    4 months.  Stable if not improved.  . Coronary Artery Disease    Multivessel PCI.  Stable.  No angina.  . Atrial Fibrillation    No recurrent symptoms.  Stable on apixaban.   ===================================  ASSESSMENT/PLAN   Problem List Items Addressed This Visit    Cardiac arrest with ventricular fibrillation (HCC) -> 20 minutes CPR with ROSC (Chronic)    Now more than a year out from his cardiac arrest with no residual negative effects.  EF is preserved with no significant wall motion abnormalities.  Tolerating metoprolol tartrate better than carvedilol.  No angina or heart failure symptoms.      Relevant Medications   metoprolol tartrate (LOPRESSOR) 50 MG tablet   Coronary artery disease of native artery of native heart with stable angina pectoris (HCC) - Primary (Chronic)    Found to have multivessel CAD in the setting of cardiac arrest -> initially presented as inferior STEMI after arrest.  Had PCI of likely culprit lesion being the LCx-OM.  RCA also stented at that time.  Return for staged atherectomy at the bifurcation PCI of the LAD-diagonal after 1 month.  Has not had any further anginal symptoms since.  Plan:  Plavix along with Eliquis.  He is now a year out and can hold Plavix for bleeding, bruising or for any procedures.  On beta-blocker alone.  ARB/amlodipine were being held, will discontinue to allow for pressures to stabilize.  If pressures increase, would probably restart ARB only  On moderate dose atorvastatin with well-controlled lipids.      Relevant Medications   metoprolol tartrate (LOPRESSOR) 50 MG tablet   Other Relevant Orders   EKG 12-Lead (Completed)   CAD S/P 4 V PCI (Chronic)    With extensive PCI of all 3 major arteries and the major diagonal branch, I  think he needs to be on long-term Thienopyridine coverage.  We did de-escalate to Plavix 75 mg monotherapy in addition to Eliquis.  As long as he is not having any issues with bleeding or bruising, I would like to continue Plavix, however this can be held for procedures or surgeries approved with significant bleeding/bruising.Faythe Ghee to hold Plavix 5 days preop for surgeries or procedures.  This to be in conjunction with holding Eliquis 2 to 3 days preop.      Relevant Medications   metoprolol tartrate (LOPRESSOR) 50 MG tablet   Other Relevant Orders   EKG 12-Lead (Completed)   PAF (paroxysmal atrial fibrillation) ([place]); CHA2DS2VASC 3; Eliquis (Chronic)    Symptomatically, only 1 episode noted.  Probably related to his lung cancer more so than CAD.  Thankfully, he has not had any breakthrough spells.  He did have A. fib RVR in setting of anemia, none since then.  Remains on modest dose metoprolol titrate for CAD and A. fib.  Rate is well controlled.  He is on apixaban/Eliquis along with Plavix. Goal will be to continue with dual therapy as long as he has no further bleeding episodes.  Okay to hold both Eliquis (2 to 3 days preop) and Plavix (5 days) for procedures or surgeries.      Relevant Medications   metoprolol tartrate (LOPRESSOR) 50 MG tablet   Other Relevant Orders   EKG 12-Lead (Completed)   Essential hypertension (Chronic)    Blood  pressure is better today.  No longer on Azor.  I think we will probably be fine keeping him off of the AZOR &  simply using metoprolol tartrate.      Relevant Medications   metoprolol tartrate (LOPRESSOR) 50 MG tablet   Hyperlipidemia with target LDL less than 70 (Chronic)    Lipids recently checked.  Stable LDL 59. Continue on current dose of atorvastatin.      Relevant Medications   metoprolol tartrate (LOPRESSOR) 50 MG tablet   Current use of long term anticoagulation (Chronic)    Remains on combination of Eliquis and Plavix for A. fib  and CAD after multivessel PCI.  Okay to temporarily hold Plavix for bleeding (with hold of many as 5 days)   Okay to hold both Plavix and Eliquis for procedures or surgeries -> Plavix 5 days, Eliquis 2 to 3 days preop         ===================================  HPI:    John Love is a 70 y.o. male with a PMH Notable for Multivessel CAD (Out-Of-Hospital Arrest Following COVID 19 Vaccine #2-Inferior STEMI-PCI to the subtotal LCx-OM, and RCA followed by staged atherectomy PCI of LAD-diet bifurcation), PAF, Squamous Cell Lung Cancer (Right Lower Lung) who presents today for 27-monthfollow-up.  John SHELPwas last seen on June 18, 2020 for hospital follow-up (November 2021-GI bleed on combination of aspirin, Eliquis and Brilinta).  Tolerated extremely low hemoglobin levels without any anginal or heart failure symptoms.  She is noted feeling tired and worn out.  Hemoglobin levels were more stabilized.  Back to doing some exertional activity and walking some.  Less prominent cough.  Appetite is slowly improving.  No notable tachycardia or palpitation symptoms.  Recent Hospitalizations: None  Reviewed  CV studies:    The following studies were reviewed today: (if available, images/films reviewed: From Epic Chart or Care Everywhere) . None:   Interval History:   John ELLNERpresents here today essentially 1 year out from his staged bifurcation LAD having diagonal PCI, and 13 months out from his initial presentation with cardiac arrest-inferior STEMI.  He has had quite a year with his diagnosis of lung cancer, by diagnosis of atrial fibrillation and complicated by GI bleed on DAPT plus DOAC. He continues to be on treatment for his lung cancer.  He tells me he is getting chemotherapy/immunotherapy.  He does have an itchy sensation sound of the timing of his dosing, but otherwise seems to be tolerating relatively well.  He says that his baseline dyspnea has improved with notable  improved exertional dyspnea.  He just feels like he has lost energy to get up and go to do things.  He says that he starts off slowly, he is able to get going and then continue, but if he tries too hard too fast, he cannot keep up.  He denies any sensation of rapid or irregular heartbeats or palpitation to suggest recurrence of A. fib.  No CHF symptoms..  CV Review of Symptoms (Summary): positive for - dyspnea on exertion, shortness of breath and Fatigue.  All somewhat improved. negative for - chest pain, edema, irregular heartbeat, orthopnea, palpitations, paroxysmal nocturnal dyspnea, rapid heart rate or Lightheadedness, dizziness (except for treatment days), syncope/near syncope or TIA/amaurosis fugax or claudication.  He had had low blood pressures down in the 90s over 60s.  Azor dose was cut in half and then finally discontinued.  The patient does not have symptoms concerning for COVID-19 infection (fever, chills, cough, or  new shortness of breath).   REVIEWED OF SYSTEMS   Review of Systems  Constitutional: Positive for malaise/fatigue (Decreased get up and go, but okay once he gets going.) and weight loss (Up-and-down weights.).  HENT: Negative for congestion and nosebleeds.   Respiratory: Positive for cough (Stable, improved.) and shortness of breath (Getting better).   Gastrointestinal: Negative for blood in stool and melena.       He has some nausea without vomiting associated with his treatments.  Genitourinary: Negative for hematuria.  Musculoskeletal: Negative for falls, joint pain and myalgias.  Skin: Positive for itching (Associated with cancer treatment).  Neurological: Negative for dizziness, tingling and headaches.  Psychiatric/Behavioral: Negative.    I have reviewed and (if needed) personally updated the patient's problem list, medications, allergies, past medical and surgical history, social and family history.   PAST MEDICAL HISTORY   Past Medical History:   Diagnosis Date  . CAD S/P 4V PCI 08/10/2019   08/09/19: CULPRIT: m-dLCx 80%--OM2 100% @ small branch take-off. --> DES PCI: 2.25 mm x 28 mm Synergy DES (2.75 mm);  pRCA 50%-80% tandem lesions --> DES PCI: 3.5 mm x 24 mm Synergy DES (4.1 mm) 08/29/19: Staged Bifurcation PCI of p-dLAD_0  with CSI atherectomy and scoring low PTA of ostial D1-  (4 total stents, initial stent from LAD into D1 (Synergy 2.75 x 16-2.9 mm, with 3 overlapping stents in L  . Cardiac arrest with ventricular fibrillation (HCC) -> 20 minutes CPR with ROSC 08/09/2019   15 minutes following second COVID-19 vaccination -> vasovagal episode during the hypotension followed by VF arrest; found to have 4 vessel CAD on cath with culprit 100% OM 3--now status post four-vessel PCI  . Colon polyps 05/02/2020   [facility]) -colonoscopy performed to evaluate for acute GI bleed, polyps found.  Plan for outpatient colonoscopy.  . H/O: upper GI bleed 05/01/2020   Admitted with acute blood loss anemia-4+ units PRBC.  Provocative bleeding scan-small bowel AVMs s/p Cauterization 05/08/2020  . Multiple vessel coronary artery disease 08/10/2019   Heart Cath -PCI 08/09/2019:  CULPRIT LESION: Mid Cx to Dist Cx lesion is 80% stenosed. 3rd Mrg lesion is 100% stenosed at small branch take-off. DES PCI: 2.25 mm x 28 mm Synergy DES (proximal postdilated 2.75 mm.  Post intervention, there is a 0% residual stenosis.  Small side branch was occluded Ost RCA to Prox RCA lesion is 50% stenosed.Prox RCA lesion is 80% stenosed.  DES PCI: 3.5 mm x 24 mm Sy  . Paroxysmal atrial fibrillation ([place]) 04/06/2020   [facility] -in A. fib RVR   . Squamous cell carcinoma of bronchus in right lower lobe ([place]) 01/2020   Seen on CT scan 01/28/2020-status post bronchoscopy with biopsy -> pathology T3, N2, M0 poorly differentiated; MRI Brain - NO intracranial Mets.  . Sustained ventricular fibrillation ([place]) 08/09/2019   In setting of acute MI    PAST SURGICAL HISTORY    Past Surgical History:  Procedure Laterality Date  . COLONOSCOPY WITH ESOPHAGOGASTRODUODENOSCOPY (EGD)  05/02/2020   [facility]: No evidence of acute bleeding, colon polyps noted.  . CORONARY ATHERECTOMY N/A 08/29/2019   Procedure: CORONARY ATHERECTOMY;  Surgeon: Leonie Man, MD;  Location: [facility];  Service: Cardiovascular; SCORING BALLOON ANGIOPLASTY- ostial D1, CSI ATHERECTOMY p-mLAD -> followed by bifurcation stenting  . CORONARY STENT INTERVENTION N/A 08/09/2019   Procedure: CORONARY STENT INTERVENTION;  Surgeon: Lorretta Harp, MD::  2V PCI: Culprit lesion ~100% OM3 (with 80% main LCx) --> DES  PCI covering both lesions-Synergy DES 2.25 mm x 28 mm, postdilated to 2.75 mm; LESION #2 proximal RCA 50% followed by 80% --> DES PCI covering both-Synergy DES 3.5 mm x 24 mm  . CORONARY STENT INTERVENTION N/A 08/29/2019   Procedure: CORONARY STENT INTERVENTION;  Surgeon: Leonie Man, MD:: Bifurcation DES PCI, Reverse Culotte Technique W/ KB (4 total SYNERGY DES stents, 1st (2.75X16 - 2.9 MM) - pLAD-ostD1 & 3 overlapping in LAD (3.0 x 38, 3.0 x 16, 2.75 x16 - tapered POT postdilation 3.6-3.1 & apical LAD PTA only.  . CYSTOSCOPY N/A 08/18/2019   Procedure: CYSTOSCOPY CLOT EVACUATION FULGURATION 0.5-2 CM.;  Surgeon: Festus Aloe, MD;  Location: [facility];  Service: Urology;  Laterality: N/A;  . ESOPHAGOGASTRODUODENOSCOPY W/ BANDING  05/08/2020   [facility]: identified Small Bowel AVMs - > Rx with Cauterization  . LEFT HEART CATH AND CORONARY ANGIOGRAPHY N/A 08/09/2019   Procedure: RIGHT & LEFT HEART CATH AND CORONARY ANGIOGRAPHY;  Surgeon: Lorretta Harp, MD;  Location: [facility]:: post-arrest : 80% LCx-100% OM3 (DES PCI), pRCA tandem 50 & 80% (DES PCI), Bifurcation LAD (75% calcified)-ostial D1 95%.  LVEDP 11 mmHg, PCWP 15 mmHg.   [name] MESENTERIC ARTERIOGRAM  05/07/2020   [facility]: No evidence of bleed or target ASM for treatment (Presented with ABLA - UGIB)   . TRANSTHORACIC  ECHOCARDIOGRAM  08/10/2019    Post cardiac arrest: EF 55 to 60%.  No R WMA.  GR 1 DD.  Normal RV.  Normal valves.  . TRANSTHORACIC ECHOCARDIOGRAM  02/23/2020   normal LV systolic function with an EF of 55-60%. The left atrium was mildly dilated.   Trivial pericardial effusion.  [name] VIDEO BRONCHOSCOPY WITH RADIAL ENDOBRONCHIAL ULTRASOUND  02/09/2020   [facility]: Dr. Hilton Cork => for RLL Lung mass: Prominent adenopathy in the paratracheal (2R-4R) station, subcarinal station(7), and 11 L.  Complex adenopathy/Mass in R [place] (unable to determine nodes versus direct tumor invasion). EBUS-transBronch Needle Asp @ 11L, 7 & 2R LNs-> No evidence of Malignancy; TO of Bronchus Intermedius w/ both endobronchial tumor & extrinsic compression. Bx taken.   08/09/2019 (post arrest - Imf STEMI)     Staged LAD PCI 08/29/2019    Immunization History  Administered Date(s) Administered  . PFIZER(Purple Top)SARS-COV-2 Vaccination 07/19/2019, 08/09/2019    MEDICATIONS/ALLERGIES   Current Meds  Medication Sig  . apixaban (ELIQUIS) 5 MG TABS tablet Take 1 tablet (5 mg total) by mouth 2 (two) times daily.  [name] ascorbic acid (VITAMIN C) 500 MG tablet 1 tab(s)  . clopidogrel (PLAVIX) 75 MG tablet ( only the first day take 4 tablets) then start 75 mg tablet by mouth  daily  . Multiple Vitamin (MULTIVITAMIN ADULT PO) Take 2 tablets by mouth daily.  . prochlorperazine (COMPAZINE) 10 MG tablet TAKE 1 TABLET(10 MG) BY MOUTH EVERY 6 HOURS AS NEEDED FOR NAUSEA  . senna-docusate (SENOKOT-S) 8.6-50 MG tablet Take by mouth.  . sildenafil (VIAGRA) 50 MG tablet Take 1 to 2 tablet by mouth daily as needed for erectile dysfunction.  . tamsulosin (FLOMAX) 0.4 MG CAPS capsule Take 1 capsule (0.4 mg total) by mouth daily after supper. (Patient taking differently: Take 0.4 mg by mouth daily.)  . triamcinolone (KENALOG) 0.1 % Apply topically 2 (two) times daily.    No Known Allergies  SOCIAL HISTORY/FAMILY HISTORY   Reviewed  in Epic:  Pertinent findings:  Social History   Tobacco Use  . Smoking status: Former Smoker    Packs/day: 1.50  Types: Cigarettes    Quit date: 08/09/2019    Years since quitting: 1.1  . Smokeless tobacco: Never Used  Vaping Use  . Vaping Use: Never used  Substance Use Topics  . Alcohol use: Yes    Alcohol/week: 4.0 standard drinks    Types: 4 Cans of beer per week    Comment: [place]  . Drug use: Never   Social History   Social History Narrative  . Not on file    OBJCTIVE -PE, EKG, labs   Wt Readings from Last 3 Encounters:  09/19/20 206 lb 12.8 oz (93.8 kg)  06/18/20 210 lb 12.8 oz (95.6 kg)  02/15/20 202 lb (91.6 kg)    Physical Exam: BP 130/82   Pulse 89   Ht _0  (1.854 m)   Wt 206 lb 12.8 oz (93.8 kg)   SpO2 97%   BMI 27.28 kg/m  Physical Exam Vitals reviewed.  Constitutional:      Appearance: Normal appearance. He is normal weight. He is not ill-appearing or toxic-appearing.     Comments: Well-appearing.  Well-groomed.  Well-nourished.  HENT:     Head: Normocephalic and atraumatic.  Neck:     Vascular: No carotid bruit, hepatojugular reflux or JVD.  Cardiovascular:     Rate and Rhythm: Normal rate and regular rhythm.  No extrasystoles are present.    Chest Wall: PMI is not displaced.     Pulses: Intact distal pulses.     Heart sounds: S1 normal and S2 normal. Heart sounds are distant. No murmur heard. No gallop. No S4 sounds.   Pulmonary:     Effort: Pulmonary effort is normal. No respiratory distress.     Breath sounds: Rhonchi (Right lower lobe) present. No wheezing.  Chest:     Chest wall: No tenderness.  Musculoskeletal:        General: No swelling. Normal range of motion.     Cervical back: Normal range of motion and neck supple.  Skin:    General: Skin is warm and dry.  Neurological:     General: No focal deficit present.     Mental Status: He is alert and oriented to person, place, and time. Mental status is at baseline.   Psychiatric:        Mood and Affect: Mood normal.        Behavior: Behavior normal.        Thought Content: Thought content normal.        Judgment: Judgment normal.     Comments: In good spirits     Adult ECG Report  Rate: 89;  Rhythm: normal sinus rhythm and Normal axis, intervals and durations.;   Narrative Interpretation: Stable2 Recent Labs: Reviewed Lab Results  Component Value Date   CHOL 110 09/11/2020   HDL 35 (L) 09/11/2020   LDLCALC 59 09/11/2020   TRIG 76 09/11/2020   CHOLHDL 3.1 09/11/2020   Lab Results  Component Value Date   CREATININE 0.98 12/08/2019   BUN 15 12/08/2019   NA 142 12/08/2019   K 5.2 12/08/2019   CL 103 12/08/2019   CO2 24 12/08/2019   CBC Latest Ref Rng & Units 12/08/2019 08/30/2019 08/18/2019  WBC 3.4 - 10.8 x10E3/uL 10.0 9.0 12.5(H)  Hemoglobin 13.0 - 17.7 g/dL 12.7(L) 9.5(L) 11.0(L)  Hematocrit 37.5 - 51.0 % 39.5 29.0(L) 33.4(L)  Platelets 150 - 450 x10E3/uL 457(H) 437(H) 387    No results found for: TSH  ==================================================  COVID-19 Education: The signs and symptoms of  COVID-19 were discussed with the patient and how to seek care for testing (follow up with PCP or arrange E-visit).   The importance of social distancing and COVID-19 vaccination was discussed today. The patient is practicing social distancing & Masking.   I spent a total of 60mnutes with the patient spent in direct patient consultation.  Additional time spent with chart review  / charting (studies, outside notes, etc): 12 min Total Time: 41 min   Current medicines are reviewed at length with the patient today.  (+/- concerns) n/a  This visit occurred during the SARS-CoV-2 public health emergency.  Safety protocols were in place, including screening questions prior to the visit, additional usage of staff PPE, and extensive cleaning of exam room while observing appropriate contact time as indicated for disinfecting solutions.  Notice:  This dictation was prepared with Dragon dictation along with smaller phrase technology. Any transcriptional errors that result from this process are unintentional and may not be corrected upon review.  Patient Instructions / Medication Changes & Studies & Tests Ordered   Patient Instructions  Medication Instructions:   continue with medications Do not restart Azor *If you need a refill on your cardiac medications before your next appointment, please call your pharmacy*   Lab Work: Not needed I   Testing/Procedures: Not needed   Follow-Up: [facility] and your [facility] needs are our priority.  As part of our continuing mission to provide you with exceptional heart care, we have created designated Provider Care Teams.  These Care Teams include your primary Cardiologist (physician) and Advanced Practice Providers (APPs -  Physician Assistants and Nurse Practitioners) who all work together to provide you with the care you need, when you need it.  We recommend signing up for the patient portal called "MyChart".  Sign up information is provided on this After Visit Summary.  MyChart is used to connect with patients for Virtual Visits (Telemedicine).  Patients are able to view lab/test results, encounter notes, upcoming appointments, etc.  Non-urgent messages can be sent to your provider as well.   To learn more about what you can do with MyChart, go to [email or web address]    Your next appointment:   6 month(s)  The format for your next appointment:   In Person  Provider:   DGlenetta Hew MD   Other Instructions Keep exercising    Studies Ordered:   Orders Placed This Encounter  Procedures  . EKG 12-Lead     DGlenetta Love M.D., M.S. Interventional Cardiologist   Pager # [phone] # [phone] [address] [place] [id number]  [facility] you [facility]!

## 2020-09-19 NOTE — Patient Instructions (Signed)
Medication Instructions:   continue with medications Do not restart Azor *If you need a refill on your cardiac medications before your next appointment, please call your pharmacy*   Lab Work: Not needed I   Testing/Procedures: Not needed   Follow-Up: At [facility], you and your health needs are our priority.  As part of our continuing mission to provide you with exceptional heart care, we have created designated Provider Care Teams.  These Care Teams include your primary Cardiologist (physician) and Advanced Practice Providers (APPs -  Physician Assistants and Nurse Practitioners) who all work together to provide you with the care you need, when you need it.  We recommend signing up for the patient portal called "MyChart".  Sign up information is provided on this After Visit Summary.  MyChart is used to connect with patients for Virtual Visits (Telemedicine).  Patients are able to view lab/test results, encounter notes, upcoming appointments, etc.  Non-urgent messages can be sent to your provider as well.   To learn more about what you can do with MyChart, go to [email or web address].    Your next appointment:   6 month(s)  The format for your next appointment:   In Person  Provider:   [name], MD   Other Instructions Keep exercising

## 2020-10-07 ENCOUNTER — Encounter: Payer: Self-pay | Admitting: Cardiology

## 2020-10-07 DIAGNOSIS — Z7901 Long term (current) use of anticoagulants: Secondary | ICD-10-CM | POA: Insufficient documentation

## 2020-10-07 NOTE — Assessment & Plan Note (Signed)
Symptomatically, only 1 episode noted.  Probably related to his lung cancer more so than CAD.  Thankfully, he has not had any breakthrough spells.  He did have A. fib RVR in setting of anemia, none since then.  Remains on modest dose metoprolol titrate for CAD and A. fib.  Rate is well controlled.  He is on apixaban/Eliquis along with Plavix. Goal will be to continue with dual therapy as long as he has no further bleeding episodes.  Okay to hold both Eliquis (2 to 3 days preop) and Plavix (5 days) for procedures or surgeries.

## 2020-10-07 NOTE — Assessment & Plan Note (Addendum)
Found to have multivessel CAD in the setting of cardiac arrest -> initially presented as inferior STEMI after arrest.  Had PCI of likely culprit lesion being the LCx-OM.  RCA also stented at that time.  Return for staged atherectomy at the bifurcation PCI of the LAD-diagonal after 1 month.  Has not had any further anginal symptoms since.  Plan:  Plavix along with Eliquis.  He is now a year out and can hold Plavix for bleeding, bruising or for any procedures.  On beta-blocker alone.  ARB/amlodipine were being held, will discontinue to allow for pressures to stabilize.  If pressures increase, would probably restart ARB only  On moderate dose atorvastatin with well-controlled lipids.

## 2020-10-07 NOTE — Assessment & Plan Note (Signed)
Lipids recently checked.  Stable LDL 59. Continue on current dose of atorvastatin.

## 2020-10-07 NOTE — Assessment & Plan Note (Signed)
Remains on combination of Eliquis and Plavix for A. fib and CAD after multivessel PCI.  Okay to temporarily hold Plavix for bleeding (with hold of many as 5 days)   Okay to hold both Plavix and Eliquis for procedures or surgeries -> Plavix 5 days, Eliquis 2 to 3 days preop

## 2020-10-07 NOTE — Assessment & Plan Note (Signed)
Now more than a year out from his cardiac arrest with no residual negative effects.  EF is preserved with no significant wall motion abnormalities.  Tolerating metoprolol tartrate better than carvedilol.  No angina or heart failure symptoms.

## 2020-10-07 NOTE — Assessment & Plan Note (Signed)
Blood pressure is better today.  No longer on Azor.  I think we will probably be fine keeping him off of the AZOR &  simply using metoprolol tartrate.

## 2020-10-07 NOTE — Assessment & Plan Note (Signed)
With extensive PCI of all 3 major arteries and the major diagonal branch, I think he needs to be on long-term Thienopyridine coverage.  We did de-escalate to Plavix 75 mg monotherapy in addition to Eliquis.  As long as he is not having any issues with bleeding or bruising, I would like to continue Plavix, however this can be held for procedures or surgeries approved with significant bleeding/bruising.Faythe Ghee to hold Plavix 5 days preop for surgeries or procedures.  This to be in conjunction with holding Eliquis 2 to 3 days preop.

## 2020-12-18 ENCOUNTER — Other Ambulatory Visit: Payer: Self-pay | Admitting: Cardiology

## 2021-01-14 ENCOUNTER — Other Ambulatory Visit: Payer: Self-pay

## 2021-01-14 MED ORDER — APIXABAN 5 MG PO TABS: 5.0000 mg | ORAL_TABLET | Freq: Two times a day (BID) | ORAL | 1 refills | Status: DC

## 2021-01-14 NOTE — Telephone Encounter (Signed)
Prescription refill request for Eliquis received VIA FAX. Indication:AFIB Last office [place] 09/19/20 Scr:0.95 12/10/20 Age: 66M Weight:93.8KG

## 2021-02-14 IMAGING — CT CT ABD-PELV W/O CM
2 of 4 series · 16 of 46 positions shown, 18 images · non-contrast
Comparison: . single-view of the chest 08/10/2019 and 08/11/2019.

CLINICAL DATA: Hematuria. Patient status post cardiac
catheterization 08/09/2019.

EXAM:
CT ABDOMEN AND PELVIS WITHOUT CONTRAST
TECHNIQUE: Multidetector CT imaging of the abdomen and pelvis was performed
following the standard protocol without IV contrast.

[Series 3: a/p w/o 5mm · axial · non-contrast · 0.95mm/px · z∈[+888,+1348]mm · 13 of 101 slices shown, 15 images]
[im 5/101  soft-tissue]
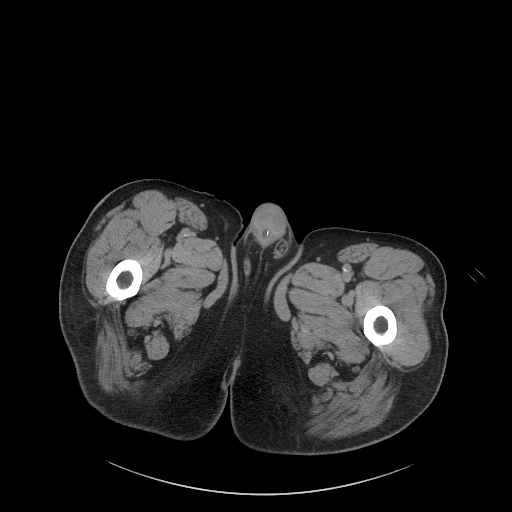
[im 5/101  bone]
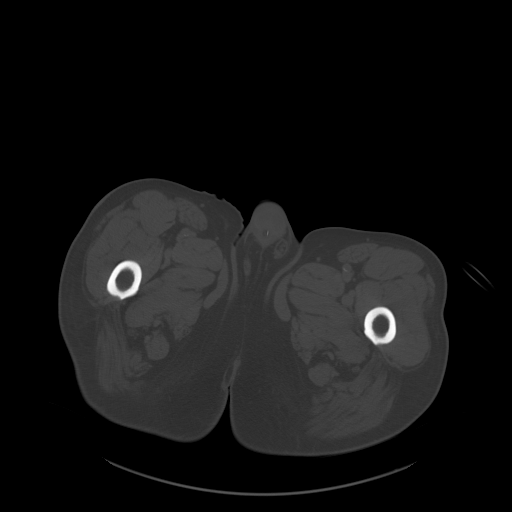
[im 13/101  soft-tissue]
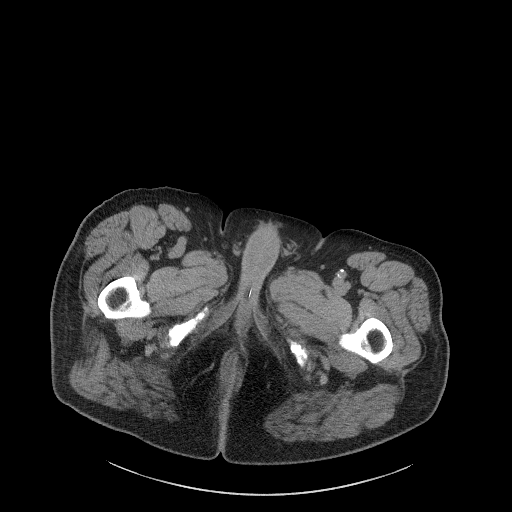
[im 21/101  soft-tissue]
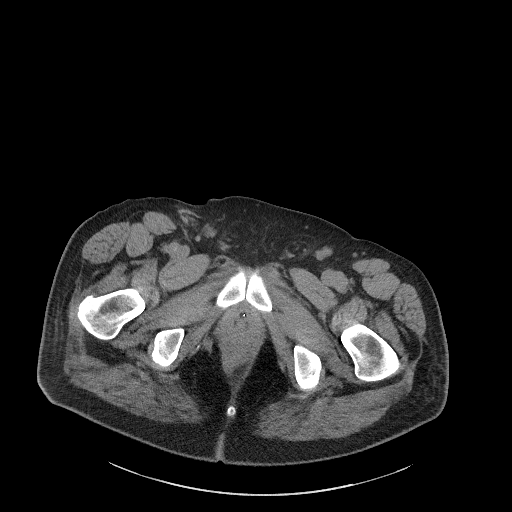
[im 29/101  soft-tissue]
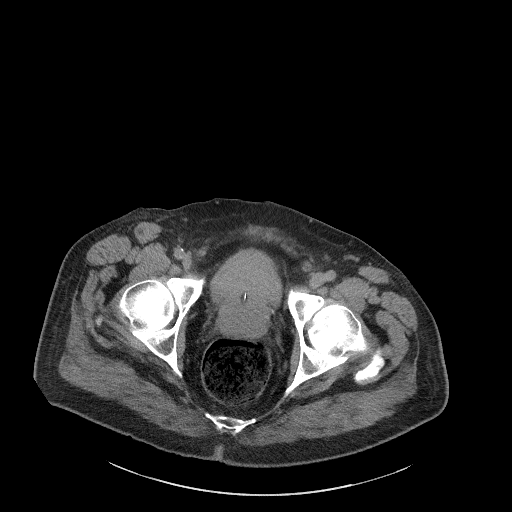
[im 37/101  soft-tissue]
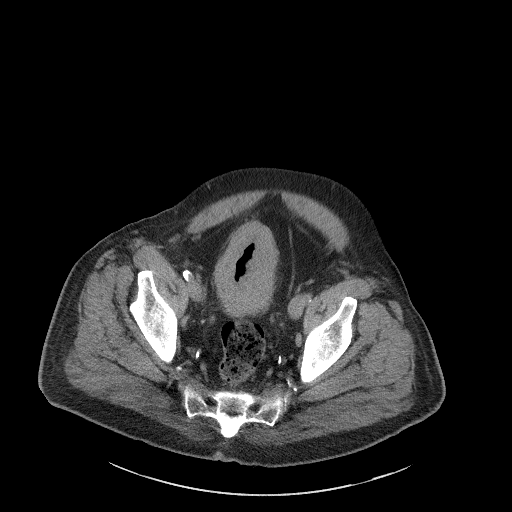
[im 45/101  soft-tissue]
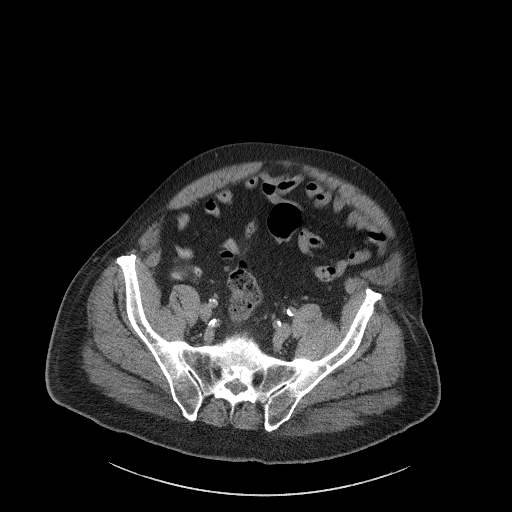
[im 53/101  soft-tissue]
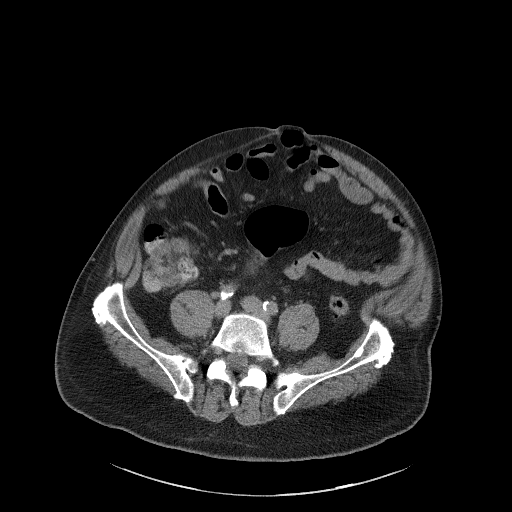
[im 57/101  soft-tissue]
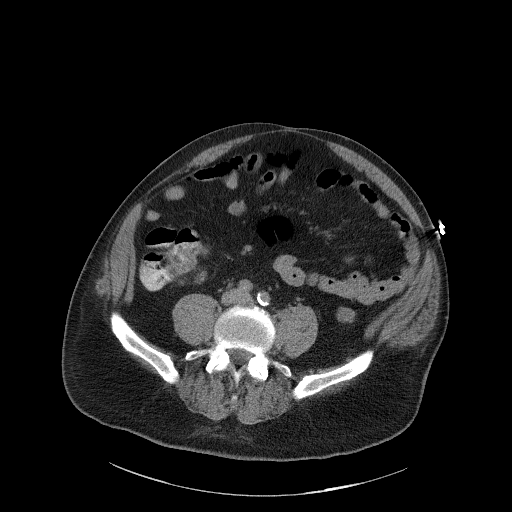
[im 65/101  soft-tissue]
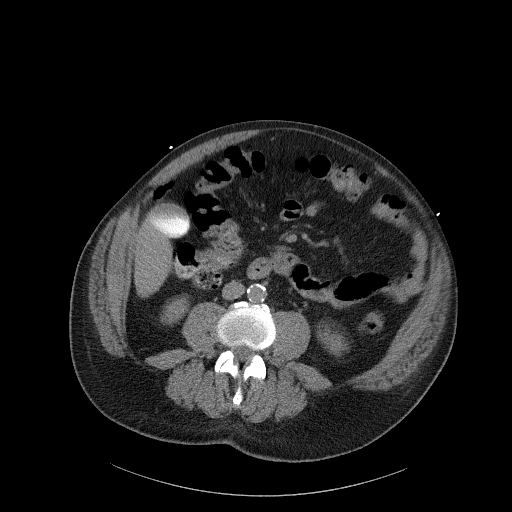
[im 65/101  bone]
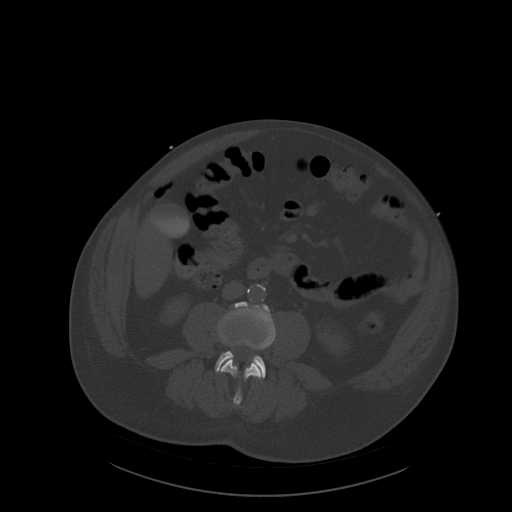
[im 73/101  soft-tissue]
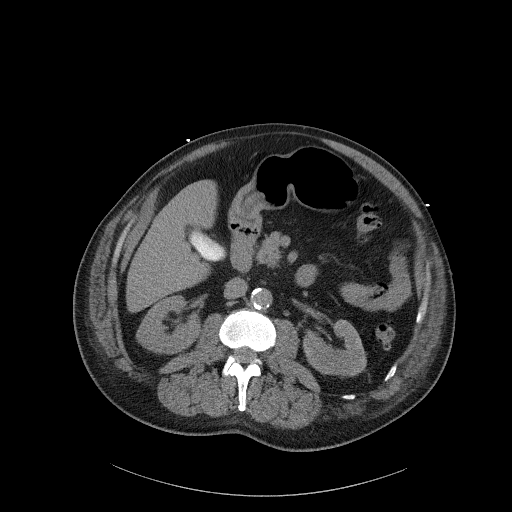
[im 81/101  soft-tissue]
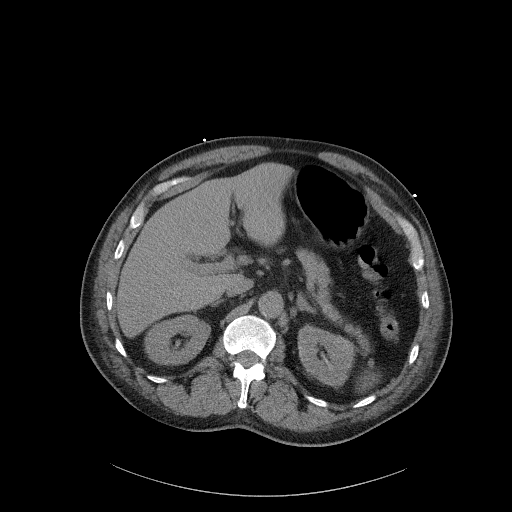
[im 89/101  soft-tissue]
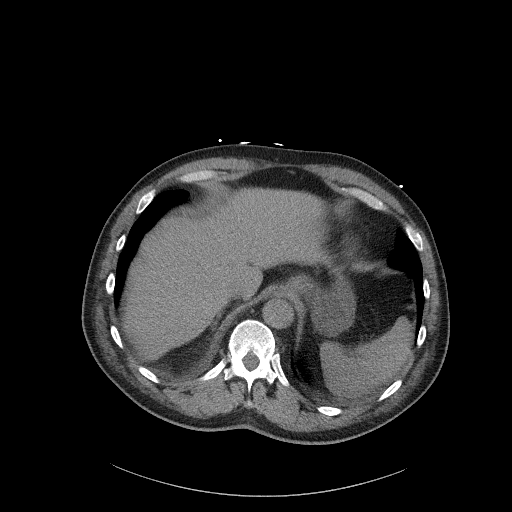
[im 97/101  soft-tissue]
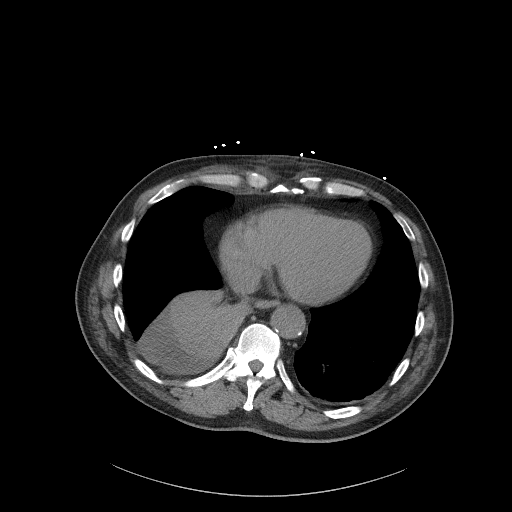

[Series 6: a/p w/o cor · coronal · non-contrast · 0.87mm/px · 3 of 186 slices shown]
[im 62/186  soft-tissue]
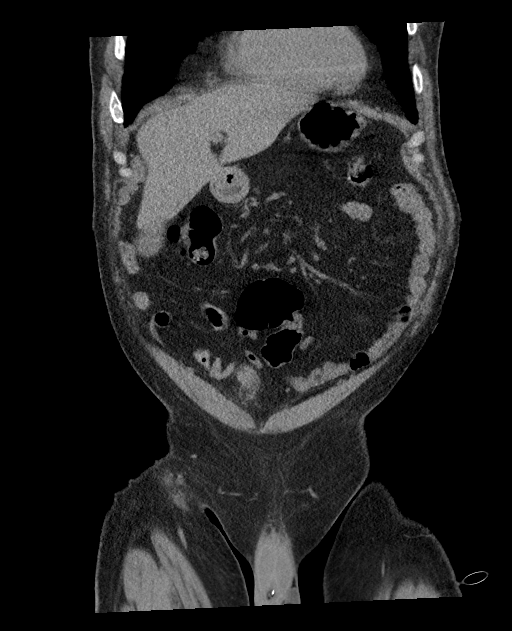
[im 83/186  soft-tissue]
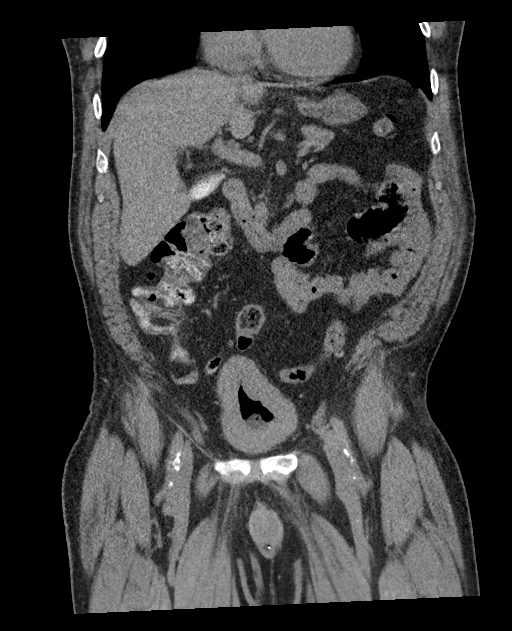
[im 103/186  soft-tissue]
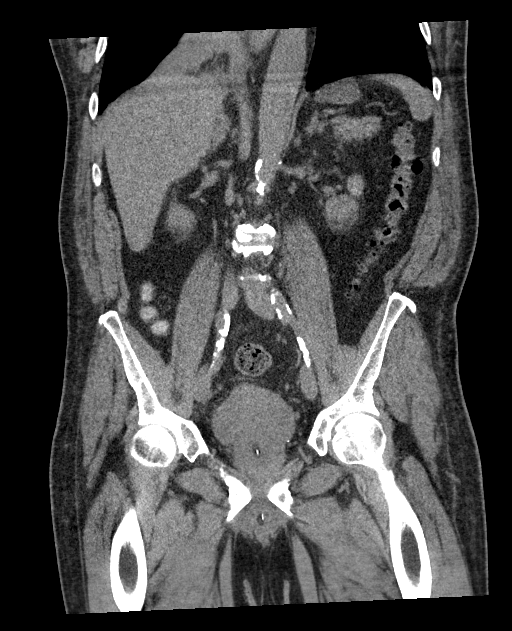

[16 of 46 positions shown; findings below may reference images not displayed]

FINDINGS: Lower chest: There is a small right pleural effusion and dense
airspace disease in the posterior right lower lobe. Trace left
effusion is also identified. Heart size is upper normal. No
pericardial effusion.

Hepatobiliary: There is contrast in the gallbladder from the
patient's cardiac catheterization. A single punctate calcification
in the posterior right hepatic lobe is incidentally noted. The liver
is otherwise unremarkable. Biliary tree appears normal.

Pancreas: Unremarkable. No pancreatic ductal dilatation or
surrounding inflammatory changes.

Spleen: Normal in size without focal abnormality.

Adrenals/Urinary Tract: The adrenal glands appear normal. The
kidneys also appear normal without stone or hydronephrosis. No renal
mass on infused examination. A Foley catheter is in place in the
urinary bladder with some associated air in the bladder. The bladder
is decompressed and its walls are severely thickened. The degree of
thickening is out of proportion to decompression.

Stomach/Bowel: Stomach is within normal limits. Appendix appears
normal. No evidence of bowel wall thickening, distention, or
inflammatory changes. A few scattered colonic diverticula are noted.

Vascular/Lymphatic: Aortic atherosclerosis. No enlarged abdominal or
pelvic lymph nodes.

Reproductive: Prostatomegaly.

Other: None.

Musculoskeletal: No acute or focal abnormality.
IMPRESSION: Marked thickening of the walls of the urinary bladder could be due
to cystitis and/or bladder outlet obstruction in this patient with
prostatomegaly. No other explanation for hematuria is present.
Negative for urinary tract stone.

Small right pleural effusion with dense airspace disease in the
posterior right lower lobe which could be due to atelectasis or
pneumonia.

Scattered diverticulosis without diverticulitis.

Atherosclerosis.

## 2021-02-28 IMAGING — CR DG CHEST 2V
2 series · 2 of 2 positions shown · non-contrast
Comparison: 08/17/2019, 08/09/2019

CLINICAL DATA: Shortness of breath and cough

EXAM:
CHEST - 2 VIEW

[w chest pa]
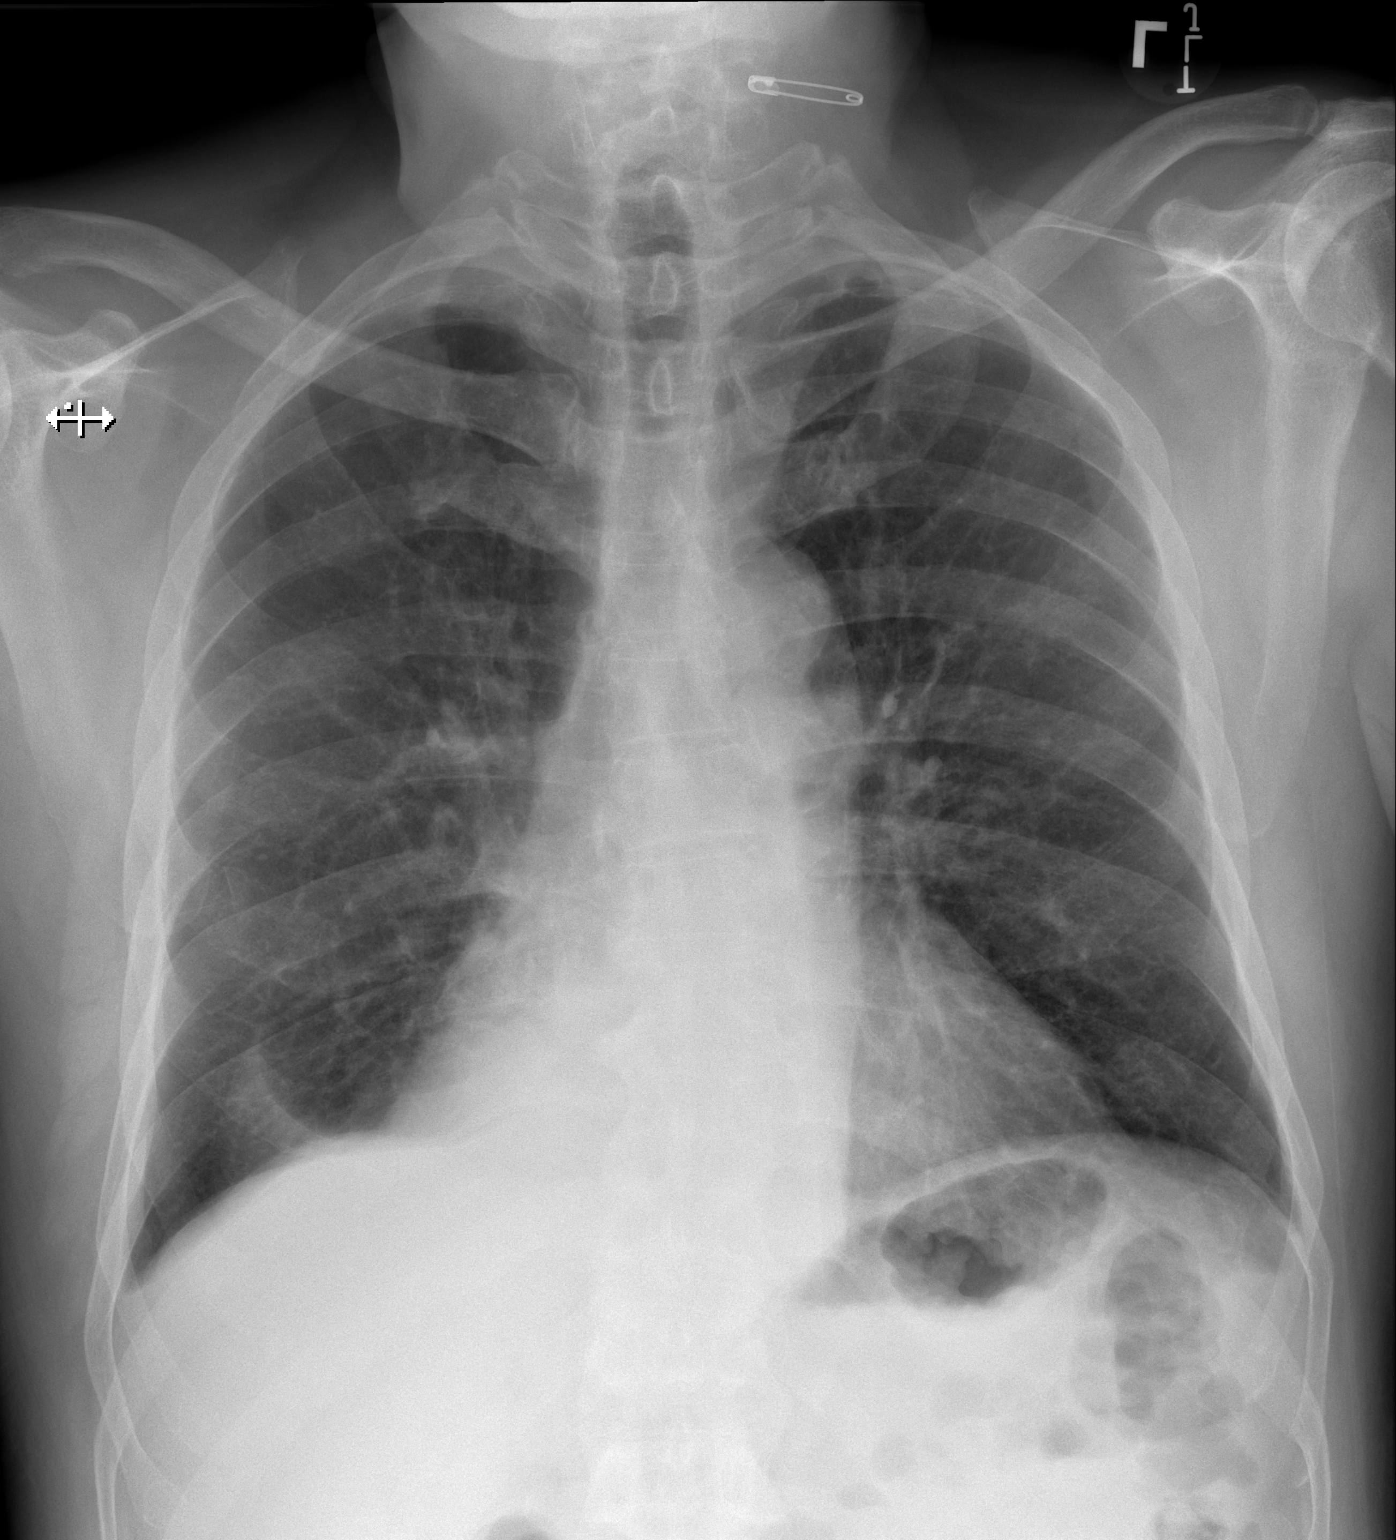

[w chest lat]
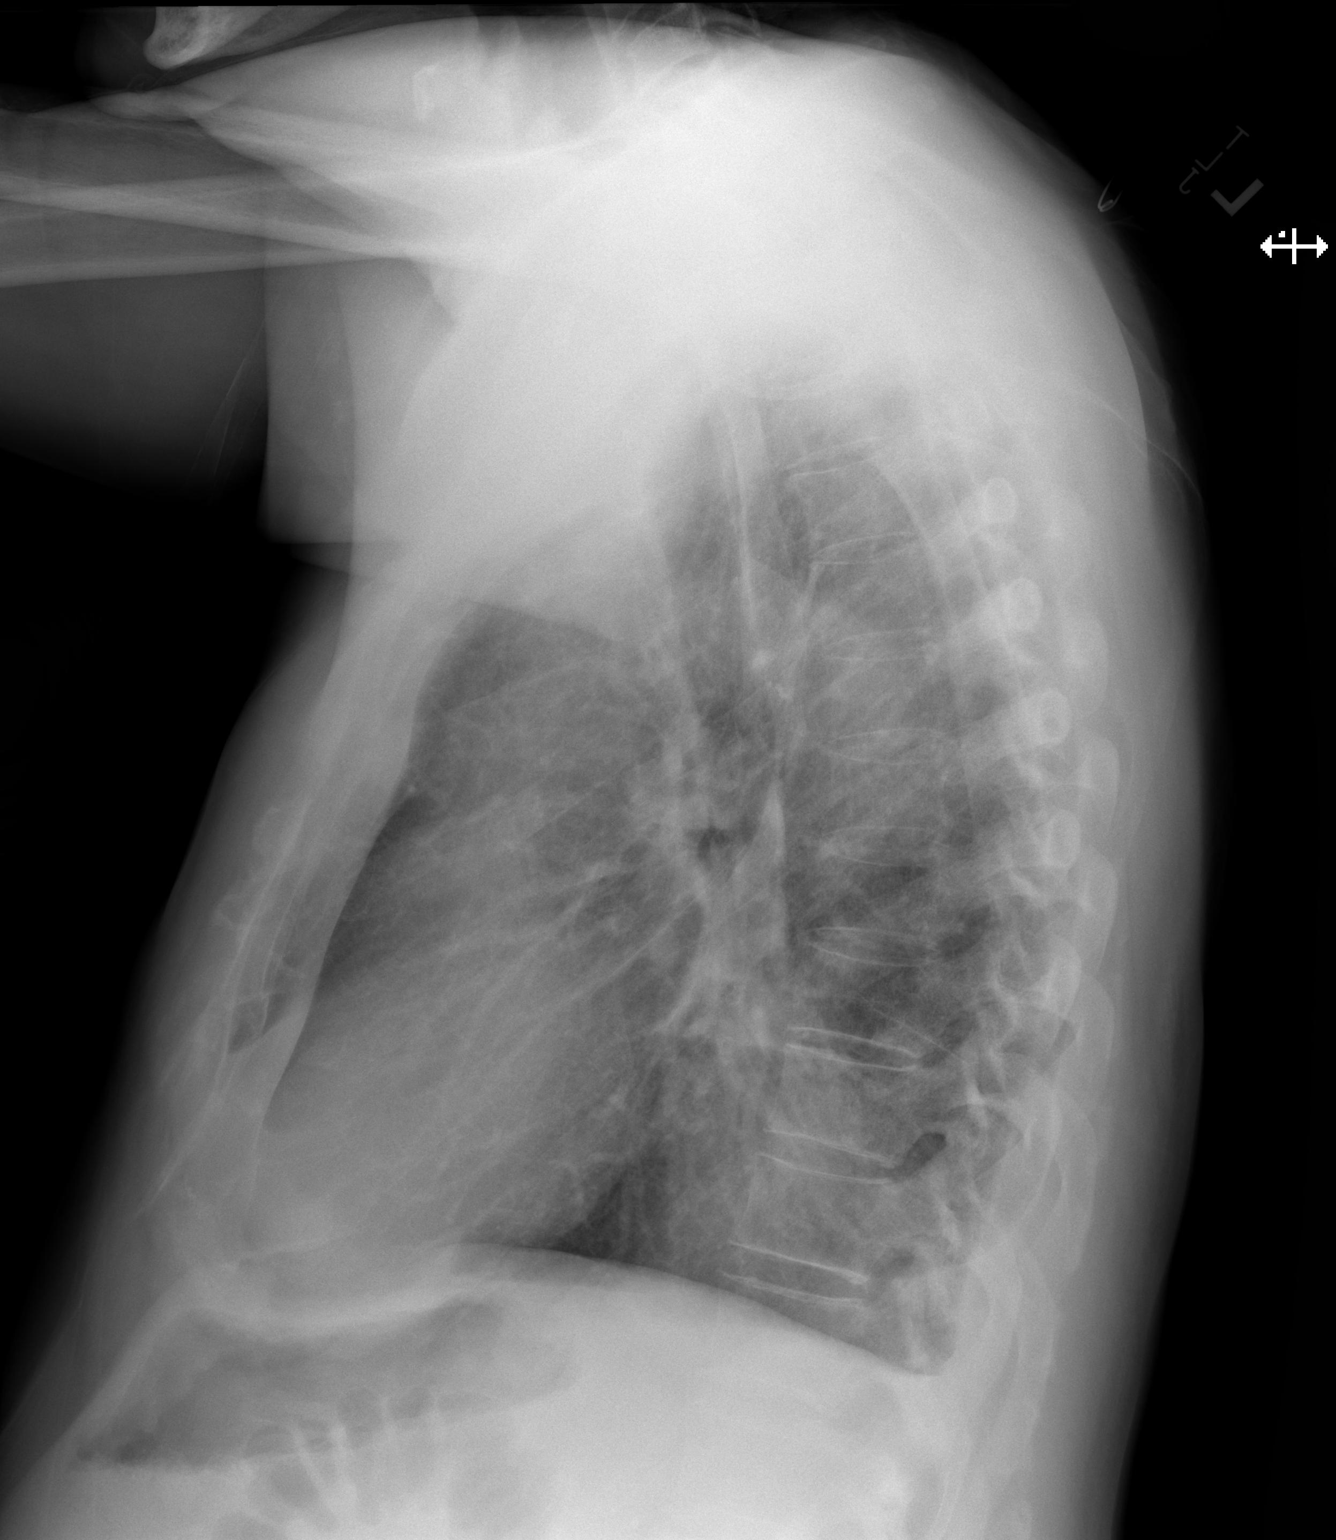

[2 of 2 positions shown; findings below may reference images not displayed]

FINDINGS: Persistent airspace disease at the right lower lobe. The left lung
is grossly clear. Probable tiny right pleural effusion. Stable
cardiomediastinal silhouette. No pneumothorax.
IMPRESSION: No significant interval change in trace right pleural effusion and
persistent consolidative process at the right lower lobe

## 2021-03-26 ENCOUNTER — Encounter: Payer: Self-pay | Admitting: Cardiology

## 2021-03-26 ENCOUNTER — Other Ambulatory Visit: Payer: Self-pay

## 2021-03-26 ENCOUNTER — Ambulatory Visit: Payer: Medicare Other | Admitting: Cardiology

## 2021-03-26 VITALS — BP 138/72 | HR 80 | Ht 73.0 in

## 2021-03-26 DIAGNOSIS — E785 Hyperlipidemia, unspecified: Secondary | ICD-10-CM

## 2021-03-26 DIAGNOSIS — Z7901 Long term (current) use of anticoagulants: Secondary | ICD-10-CM

## 2021-03-26 DIAGNOSIS — Z716 Tobacco abuse counseling: Secondary | ICD-10-CM

## 2021-03-26 DIAGNOSIS — I4901 Ventricular fibrillation: Secondary | ICD-10-CM

## 2021-03-26 DIAGNOSIS — I25118 Atherosclerotic heart disease of native coronary artery with other forms of angina pectoris: Secondary | ICD-10-CM | POA: Diagnosis not present

## 2021-03-26 DIAGNOSIS — F17200 Nicotine dependence, unspecified, uncomplicated: Secondary | ICD-10-CM

## 2021-03-26 DIAGNOSIS — I480 Paroxysmal atrial fibrillation: Secondary | ICD-10-CM | POA: Diagnosis not present

## 2021-03-26 DIAGNOSIS — I10 Essential (primary) hypertension: Secondary | ICD-10-CM

## 2021-03-26 DIAGNOSIS — I2510 Atherosclerotic heart disease of native coronary artery without angina pectoris: Secondary | ICD-10-CM | POA: Diagnosis not present

## 2021-03-26 DIAGNOSIS — Z9861 Coronary angioplasty status: Secondary | ICD-10-CM

## 2021-03-26 DIAGNOSIS — I469 Cardiac arrest, cause unspecified: Secondary | ICD-10-CM | POA: Diagnosis not present

## 2021-03-26 DIAGNOSIS — I779 Disorder of arteries and arterioles, unspecified: Secondary | ICD-10-CM

## 2021-03-26 DIAGNOSIS — Z8719 Personal history of other diseases of the digestive system: Secondary | ICD-10-CM

## 2021-03-26 DIAGNOSIS — N521 Erectile dysfunction due to diseases classified elsewhere: Secondary | ICD-10-CM

## 2021-03-26 LAB — COMPREHENSIVE METABOLIC PANEL
ALT: 22 IU/L (ref 0–44)
AST: 21 IU/L (ref 0–40)
Albumin/Globulin Ratio: 1.7 (ref 1.2–2.2)
Albumin: 4.5 g/dL (ref 3.8–4.8)
Alkaline Phosphatase: 114 IU/L (ref 44–121)
BUN/Creatinine Ratio: 9 — ABNORMAL LOW (ref 10–24)
BUN: 9 mg/dL (ref 8–27)
Bilirubin Total: 0.6 mg/dL (ref 0.0–1.2)
CO2: 21 mmol/L (ref 20–29)
Calcium: 9.6 mg/dL (ref 8.6–10.2)
Chloride: 102 mmol/L (ref 96–106)
Creatinine, Ser: 0.96 mg/dL (ref 0.76–1.27)
Globulin, Total: 2.6 g/dL (ref 1.5–4.5)
Glucose: 95 mg/dL (ref 70–99)
Potassium: 4.8 mmol/L (ref 3.5–5.2)
Sodium: 139 mmol/L (ref 134–144)
Total Protein: 7.1 g/dL (ref 6.0–8.5)
eGFR: 86 mL/min/{1.73_m2} (ref >59)

## 2021-03-26 LAB — LIPID PANEL
Chol/HDL Ratio: 2.9 ratio (ref 0.0–5.0)
Cholesterol, Total: 120 mg/dL (ref 100–199)
HDL: 42 mg/dL (ref >39)
LDL Chol Calc (NIH): 56 mg/dL (ref 0–99)
Triglycerides: 124 mg/dL (ref 0–149)
VLDL Cholesterol Cal: 22 mg/dL (ref 5–40)

## 2021-03-26 MED ORDER — BUPROPION HCL ER (XL) 150 MG PO TB24: 150.0000 mg | ORAL_TABLET | Freq: Two times a day (BID) | ORAL | 3 refills | Status: DC

## 2021-03-26 MED ORDER — TADALAFIL 10 MG PO TABS: ORAL_TABLET | ORAL | 11 refills | Status: DC

## 2021-03-26 NOTE — Progress Notes (Signed)
With   Primary Care Provider: Patient, No Pcp Per (Inactive) Cardiologist: [name], MD Electrophysiologist: None  Clinic Note: Chief Complaint  Patient presents with   Follow-up    6 months.  Relatively stable.   Coronary Artery Disease    No angina.  No further bleeding   Atrial Fibrillation    No recent breakthrough    ===================================  ASSESSMENT/PLAN   Problem List Items Addressed This Visit       Cardiology Problems   Cardiac arrest with ventricular fibrillation (HCC) -> 20 minutes CPR with ROSC (Chronic)   Relevant Medications   amLODipine-olmesartan (AZOR) 5-20 MG tablet   tadalafil (CIALIS) 10 MG tablet   Coronary artery disease of native artery of native heart with stable angina pectoris (HCC) (Chronic)    Multivessel PCI with extensive PCI to the LAD diagonal system.  Unfortunately, with this much PCI, would recommend long-term Plavix as long as he is not having bleeding issues on Eliquis.  Plan: Continue Plavix along with Eliquis and statin, ARB beta-blocker and calcium channel blocker.  No recurrent anginal symptoms.  Has tolerated lung cancer therapy without any major issues.      Relevant Medications   amLODipine-olmesartan (AZOR) 5-20 MG tablet   buPROPion (WELLBUTRIN XL) 150 MG 24 hr tablet   tadalafil (CIALIS) 10 MG tablet   Other Relevant Orders   Lipid panel (Completed)   Comprehensive metabolic panel (Completed)   Hemoglobin A1c (Completed)   CAD S/P 4 V PCI (Chronic)    Extensive PCI.    Plan is to maintain lifelong Plavix but okay to hold he is beyond 1 year from PCI.  Is on stable dose of statin, beta-blocker Calcitrel blocker and ARB.      Relevant Medications   amLODipine-olmesartan (AZOR) 5-20 MG tablet   tadalafil (CIALIS) 10 MG tablet   Other Relevant Orders   Lipid panel (Completed)   Comprehensive metabolic panel (Completed)   Hemoglobin A1c (Completed)   PAF (paroxysmal atrial fibrillation) ([place]);  CHA2DS2VASC 3; Eliquis - Primary (Chronic)    As far as I can tell, he has not had any breakthrough episodes since his initial episode last year.  Was probably related to all the inflammation from pneumonia and lung cancer.  Maintaining sinus rhythm at this point.  He is on Eliquis for anticoagulation and metoprolol for rate control.  No further bleeding  Okay to hold Eliquis 2 to 3 days preop for surgeries or procedures.      Relevant Medications   amLODipine-olmesartan (AZOR) 5-20 MG tablet   tadalafil (CIALIS) 10 MG tablet   Essential hypertension (Chronic)    Blood pressure is back up, and his PCP restarted Azor but lower dose.  He is on now Azor which is a combination of amlodipine-losartan along with metoprolol tartrate 50 mg twice daily.  Will no significant edema.  Well-controlled pressures-no hypotension or orthostatic symptoms.      Relevant Medications   amLODipine-olmesartan (AZOR) 5-20 MG tablet   tadalafil (CIALIS) 10 MG tablet   Hyperlipidemia with target LDL less than 70 (Chronic)    Most recent LDL was 59 with total cholesterol 110.  Excellent labs on current dose of atorvastatin.  No change.  Plan to recheck a lipid panel, CMP and A1c in March.      Relevant Medications   amLODipine-olmesartan (AZOR) 5-20 MG tablet   tadalafil (CIALIS) 10 MG tablet   Other Relevant Orders   Lipid panel (Completed)   Comprehensive metabolic panel (Completed)  Hemoglobin A1c (Completed)     Other   H/O: upper GI bleed: Small [place] AVMs - Cauterized (Chronic)    History of GI bleed requiring blood transfusion.  This was in the setting of triple therapy using aspirin, Brilinta and Eliquis.  Unfortunately the message did not get to them in time that we were to stop aspirin and Brilinta and switch to Plavix alone along with Eliquis.  No further GI bleed.  Hemoglobin being followed by PCP.      Erectile dysfunction due to arterial disease (HCC) (Chronic)    Did not really get  much benefit from sildenafil.  We will switch to Cialis.  He is not on long-acting nitrate and has not required any nitrates...   Prescribe Cialis 10 mg 1 to 2 tablets as needed.      Encounter for smoking cessation counseling    He does have a lot of anxiety associated with it.  He is concerned about what will happen about cravings with discontinuation.  Plan: We will start Wellbutrin 150 mg twice daily-he would begin this 1 month prior to a planned stop date for smoking.  He will then continue Wellbutrin for entire year and we can reassess.  We spent close to 10 minutes talking about his concerns and plan.      Current use of long term anticoagulation (Chronic)    Remains on Eliquis for A. fib as well as Plavix for multivessel CAD and extensive PCI.[name]  He is far enough out from PCI that is okay to hold Plavix 5 to 7 days preop for surgeries or procedures.  With no recurrent A. fib symptoms, safe to stop Eliquis 2 to 3 days preop        Current every day smoker (Chronic)    He is not really smoking that much every day, a little bit upset at himself for getting back into smoking, but with all the stress of everything going on he got back into smoking and is now having a hard time stopping.  Smoking cessation counseling provided along with Wellbutrin to help with cravings.      ===================================  HPI:    John Love is a 70 y.o. male with a PMH Notable for Multivessel CAD (Out-Of-Hospital Arrest Following COVID 19 Vaccine #2-Inferior STEMI-PCI to the subtotal LCx-OM, and RCA followed by staged atherectomy PCI of LAD-diet bifurcation), PAF, Squamous Cell Lung Cancer (Right Lower Lobe -> T3N2Mo - 02/09/2020) who presents today for 36-monthfollow-up.  08/09/2019: V Fib arrest during 15 min observation period after 2nd COVID Vaccine => apparent syncope - noted to be pulseless -> CPR (confirmed VF) -> ROSC => Lateral STEMI ->  Cath: 3 V CAD: prox RCA 80%, Ost-prox RCA  50%; D1 95% with prox-mid LAD 75%, & mid-distal LCx 80%-OM3 100% => DES PCI to m-dLCx & DES x 2 to RCA -> d/c on 08/19/2019 Echo EF 55-60%. No RWMA.  08/29/2019: Staged Atherectomy-Bifurcation PCI of LAD-Diag 3 Stentrs in LAD & 1 in Ost Diag. 01/28/2020: [facility] => hemoptysis; CT Chest showed lung mass - 2 rounds of Abx => Seen by Dr. GHilton Cork8/19/2021 -> CT showed RLL mass c/w malignancy => PET on 02/22/20; Brilinta held for Bx Bronchoscopy 8/26 -> Dx SSCa in RLL - C lung cancer treatment below. 04/06/2020 - > [place] to [facility]- noted to be in Afib => unfortunately, he was discontinued on Eliquis, aspirin and Brilinta Nov 16-25/21: Admitted with GI bleed =>  aspirin Plavix restarted with plan to restart Eliquis after 1 month and stop aspirin.  Amlodipine was discontinued due to hypotension.  John Love was last seen on September 19, 2020 essentially 1 year out from his staged PCI to the LAD at 30 months of residential MI/ cardiac arrest.  Had done really well from a cardiac standpoint with with exception of the new diagnosis of A. fib and GI bleed.  No further anginal symptoms.  No heart failure symptoms of PND, orthopnea edema.  No recurrent A. fib.  No recurrent bleeding with combination of Plavix plus Eliquis.  Had started immunotherapy (see below) for his lung cancer having completed his general chemotherapy and radiation therapy.s -> Baseline dyspnea improved as did exertional dyspnea.  Energy level still down but getting better. No angina CHF.  No recurrent A. fib. Azor was weaned off secondary to low blood pressures.  Recent Hospitalizations: None  Reviewed  CV studies:    The following studies were reviewed today: (if available, images/films reviewed: From Epic Chart or Care Everywhere) No new studies:  Interval History:   John Love presents for 59-month follow-up overall doing very well from a cardiac standpoint.  He is now back on Azor without any hypotension  episodes.  He denies any current chest pain or CHF symptoms.  He still has some exertional dyspnea and occasional orthostatic dizziness but no syncope or near syncope.  He does not do a whole lot but he does feel less fatigued now.  He is try to stay active.  He almost feels back to normal.  Does occasionally have a cough.  CV Review of Symptoms (Summary): positive for - dyspnea on exertion, shortness of breath and Fatigue.  He is continuing to improve.  Electrolytes function.  No real benefit from sildenafil.. negative for - chest pain, edema, irregular heartbeat, orthopnea, palpitations, paroxysmal nocturnal dyspnea, rapid heart rate or Lightheadedness, dizziness (except for treatment days), syncope/near syncope or TIA/amaurosis fugax or claudication.  The patient does not have symptoms concerning for COVID-19 infection (fever, chills, cough, or new shortness of breath).   REVIEWED OF SYSTEMS   Review of Systems  Constitutional:  Positive for malaise/fatigue (Slowly improving.  Still takes him a while to get up and go, but almost back to baseline.). Negative for weight loss (Up-and-down weights.).  HENT:  Negative for congestion and nosebleeds.   Respiratory:  Positive for cough (Stable, improved.) and shortness of breath (Getting better). Negative for hemoptysis and sputum production.   Cardiovascular:        Per HPI   Gastrointestinal:  Negative for blood in stool and melena.       He has some nausea without vomiting associated with his treatments.  Genitourinary:  Negative for dysuria and hematuria.       ED  Musculoskeletal:  Negative for falls, joint pain and myalgias.  Skin:  Negative for itching (Associated with cancer treatment).  Neurological:  Negative for dizziness, tingling, focal weakness, weakness (Getting stronger every week.) and headaches.  Psychiatric/Behavioral: Negative.  Negative for depression. The patient does not have insomnia.   I have reviewed and (if needed)  personally updated the patient's problem list, medications, allergies, past medical and surgical history, social and family history.   PAST MEDICAL HISTORY   Past Medical History:  Diagnosis Date   CAD S/P 4V PCI 08/10/2019   08/09/19: CULPRIT: m-dLCx 80%--OM2 100% @ small branch take-off. --> DES PCI: 2.25 mm x 28 mm Synergy DES (2.75 mm);  pRCA 50%-80% tandem lesions --> DES PCI: 3.5 mm x 24 mm Synergy DES (4.1 mm) 08/29/19: Staged Bifurcation PCI of p-dLAD_0  with CSI atherectomy and scoring low PTA of ostial D1-  (4 total stents, initial stent from LAD into D1 (Synergy 2.75 x 16-2.9 mm, with 3 overlapping stents in L   Cardiac arrest with ventricular fibrillation (HCC) -> 20 minutes CPR with ROSC 08/09/2019   15 minutes following second COVID-19 vaccination -> vasovagal episode during the hypotension followed by VF arrest; found to have 4 vessel CAD on cath with culprit 100% OM 3--now status post four-vessel PCI   Colon polyps 05/02/2020   [facility]) -colonoscopy performed to evaluate for acute GI bleed, polyps found.  Plan for outpatient colonoscopy.   H/O: upper GI bleed 05/01/2020   Admitted with acute blood loss anemia-4+ units PRBC.  Provocative bleeding scan-small bowel AVMs s/p Cauterization 05/08/2020   Multiple vessel coronary artery disease 08/10/2019   Heart Cath -PCI 08/09/2019:  CULPRIT LESION: Mid Cx to Dist Cx lesion is 80% stenosed. 3rd Mrg lesion is 100% stenosed at small branch take-off. DES PCI: 2.25 mm x 28 mm Synergy DES (proximal postdilated 2.75 mm.  Post intervention, there is a 0% residual stenosis.  Small side branch was occluded Ost RCA to Prox RCA lesion is 50% stenosed.Prox RCA lesion is 80% stenosed.  DES PCI: 3.5 mm x 24 mm Sy   Paroxysmal atrial fibrillation ([place]) 04/06/2020   Presented to [facility] -in A. fib RVR    Squamous cell carcinoma of bronchus in right lower lobe ([place]) 01/2020   Seen on CT scan 01/28/2020-status post bronchoscopy with biopsy -> pathology T3,  N2, M0 poorly differentiated; MRI Brain - NO intracranial Mets.   Sustained ventricular fibrillation ([place]) 08/09/2019   In setting of acute MI   Lung Ca Rx - Carboplatin AUC 2 IV plus paclitaxel 50 mg/ m. Wk 1 04/10/2020 -> waiting for canceled due to the E. coli UTI in A. fib RVR. _>  Completed 6 weeks 04/24/2020.  Radiation completed on 05/11/2020 => Durvalumb 10 mg/kg IV day 1 -cycle length 14d - started 06/11/2020 -> Has completed cycle 22 of 26 planned.   PAST SURGICAL HISTORY   Past Surgical History:  Procedure Laterality Date   COLONOSCOPY WITH ESOPHAGOGASTRODUODENOSCOPY (EGD)  05/02/2020   [facility]: No evidence of acute bleeding, colon polyps noted.   CORONARY ATHERECTOMY N/A 08/29/2019   Procedure: CORONARY ATHERECTOMY;  Surgeon: Leonie Man, MD;  Location: [place] [facility];  Service: Cardiovascular; SCORING BALLOON ANGIOPLASTY- ostial D1, CSI ATHERECTOMY p-mLAD -> followed by bifurcation stenting   CORONARY STENT INTERVENTION N/A 08/09/2019   Procedure: CORONARY STENT INTERVENTION;  Surgeon: Lorretta Harp, MD::  2V PCI: Culprit lesion ~100% OM3 (with 80% main LCx) --> DES PCI covering both lesions-Synergy DES 2.25 mm x 28 mm, postdilated to 2.75 mm; LESION #2 proximal RCA 50% followed by 80% --> DES PCI covering both-Synergy DES 3.5 mm x 24 mm   CORONARY STENT INTERVENTION N/A 08/29/2019   Procedure: CORONARY STENT INTERVENTION;  Surgeon: Leonie Man, MD:: Bifurcation DES PCI, Reverse Culotte Technique W/ KB (4 total SYNERGY DES stents, 1st (2.75X16 - 2.9 MM) - pLAD-ostD1 & 3 overlapping in LAD (3.0 x 38, 3.0 x 16, 2.75 x16 - tapered POT postdilation 3.6-3.1 & apical LAD PTA only.   CYSTOSCOPY N/A 08/18/2019   Procedure: CYSTOSCOPY CLOT EVACUATION FULGURATION 0.5-2 CM.;  Surgeon: Festus Aloe, MD;  Location: [facility];  Service: Urology;  Laterality: N/A;  ESOPHAGOGASTRODUODENOSCOPY W/ BANDING  05/08/2020   [facility]: identified Small Bowel AVMs - > Rx with Cauterization    LEFT HEART CATH AND CORONARY ANGIOGRAPHY N/A 08/09/2019   Procedure: RIGHT & LEFT HEART CATH AND CORONARY ANGIOGRAPHY;  Surgeon: Lorretta Harp, MD;  Location: [place] [facility]:: post-arrest : 80% LCx-100% OM3 (DES PCI), pRCA tandem 50 & 80% (DES PCI), Bifurcation LAD (75% calcified)-ostial D1 95%.  LVEDP 11 mmHg, PCWP 15 mmHg.    MESENTERIC ARTERIOGRAM  05/07/2020   [facility]: No evidence of bleed or target ASM for treatment (Presented with ABLA - UGIB)    TRANSTHORACIC ECHOCARDIOGRAM  08/10/2019    Post cardiac arrest: EF 55 to 60%.  No R WMA.  GR 1 DD.  Normal RV.  Normal valves.   TRANSTHORACIC ECHOCARDIOGRAM  02/23/2020   normal LV systolic function with an EF of 55-60%. The left atrium was mildly dilated.   Trivial pericardial effusion.   VIDEO BRONCHOSCOPY WITH RADIAL ENDOBRONCHIAL ULTRASOUND  02/09/2020   [facility]: Dr. Hilton Cork => for RLL Lung mass: Prominent adenopathy in the paratracheal (2R-4R) station, subcarinal station(7), and 11 L.  Complex adenopathy/Mass in R [place] (unable to determine nodes versus direct tumor invasion). EBUS-transBronch Needle Asp @ 11L, 7 & 2R LNs-> No evidence of Malignancy; TO of Bronchus Intermedius w/ both endobronchial tumor & extrinsic compression. Bx taken.   08/09/2019 (post arrest - Imf STEMI)        Staged LAD PCI 08/29/2019      Immunization History  Administered Date(s) Administered   PFIZER(Purple Top)SARS-COV-2 Vaccination 07/19/2019, 08/09/2019    MEDICATIONS/ALLERGIES   Current Meds  Medication Sig   amLODipine-olmesartan (AZOR) 5-20 MG tablet Take 1 tablet by mouth daily.   apixaban (ELIQUIS) 5 MG TABS tablet Take 1 tablet (5 mg total) by mouth 2 (two) times daily.   ascorbic acid (VITAMIN C) 500 MG tablet 1 tab(s)   atorvastatin (LIPITOR) 40 MG tablet TAKE 1 TABLET(40 MG) BY MOUTH DAILY   buPROPion (WELLBUTRIN XL) 150 MG 24 hr tablet Take 1 tablet (150 mg total) by mouth 2 (two) times daily. Take second dose early evening    clopidogrel (PLAVIX) 75 MG tablet ( only the first day take 4 tablets) then start 75 mg tablet by mouth  daily   Multiple Vitamin (MULTIVITAMIN ADULT PO) Take 2 tablets by mouth daily.   prochlorperazine (COMPAZINE) 10 MG tablet TAKE 1 TABLET(10 MG) BY MOUTH EVERY 6 HOURS AS NEEDED FOR NAUSEA   senna-docusate (SENOKOT-S) 8.6-50 MG tablet Take by mouth.   tadalafil (CIALIS) 10 MG tablet May take 1 to 2 tablet as needed for erectile dysfunction. No more than 2 tablets in a 36 hout period.   tamsulosin (FLOMAX) 0.4 MG CAPS capsule Take 1 capsule (0.4 mg total) by mouth daily after supper. (Patient taking differently: Take 0.4 mg by mouth daily.)   triamcinolone (KENALOG) 0.1 % Apply topically 2 (two) times daily.   [DISCONTINUED] sildenafil (VIAGRA) 50 MG tablet Take 1 to 2 tablet by mouth daily as needed for erectile dysfunction.    No Known Allergies  SOCIAL HISTORY/FAMILY HISTORY   Reviewed in Epic:  Pertinent findings:  Social History   Tobacco Use   Smoking status: Some Days    Packs/day: 1.50    Types: Cigarettes    Last attempt to quit: 08/09/2019    Years since quitting: 1.7   Smokeless tobacco: Never   Tobacco comments:    Unfortunately, he is back smoking some now.  Vaping  Use   Vaping Use: Never used  Substance Use Topics   Alcohol use: Yes    Alcohol/week: 4.0 standard drinks    Types: 4 Cans of beer per week    Comment: [place]   Drug use: Never   Social History   Social History Narrative   Not on file    OBJCTIVE -PE, EKG, labs   Wt Readings from Last 3 Encounters:  09/19/20 206 lb 12.8 oz (93.8 kg)  06/18/20 210 lb 12.8 oz (95.6 kg)  02/15/20 202 lb (91.6 kg)    Physical Exam: BP 138/72   Pulse 80   Ht _0  (1.854 m)   SpO2 98%   BMI 27.28 kg/m  Physical Exam Vitals reviewed.  Constitutional:      Appearance: Normal appearance. He is normal weight. He is not ill-appearing or toxic-appearing.     Comments: Well-appearing.  Well-groomed.   Well-nourished.  HENT:     Head: Normocephalic and atraumatic.  Neck:     Vascular: No carotid bruit, hepatojugular reflux or JVD.  Cardiovascular:     Rate and Rhythm: Normal rate and regular rhythm. No extrasystoles are present.    Chest Wall: PMI is not displaced.     Pulses: Normal pulses and intact distal pulses.     Heart sounds: S1 normal and S2 normal. Heart sounds are distant. No murmur heard.   No gallop. No S4 sounds.  Pulmonary:     Effort: Pulmonary effort is normal. No respiratory distress.     Breath sounds: Rhonchi (Right lower lobe rhonchi, clears somewhat with cough.) present. No wheezing.  Chest:     Chest wall: No tenderness.  Musculoskeletal:        General: No swelling. Normal range of motion.     Cervical back: Normal range of motion and neck supple.  Skin:    General: Skin is warm and dry.  Neurological:     General: No focal deficit present.     Mental Status: He is alert and oriented to person, place, and time. Mental status is at baseline.     Gait: Gait normal.  Psychiatric:        Mood and Affect: Mood normal.        Behavior: Behavior normal.        Thought Content: Thought content normal.        Judgment: Judgment normal.     Comments: Seems to be doing well in good spirits.  Happy    Adult ECG Report  Rate: 89;  Rhythm: normal sinus rhythm and Normal axis, intervals and durations. ;   Narrative Interpretation: Stable Recent Labs: Reviewed Lab Results  Component Value Date   CHOL 120 03/26/2021   HDL 42 03/26/2021   LDLCALC 56 03/26/2021   TRIG 124 03/26/2021   CHOLHDL 2.9 03/26/2021   Lab Results  Component Value Date   CREATININE 0.96 03/26/2021   BUN 9 03/26/2021   NA 139 03/26/2021   K 4.8 03/26/2021   CL 102 03/26/2021   CO2 21 03/26/2021   CBC Latest Ref Rng & Units 12/08/2019 08/30/2019 08/18/2019  WBC 3.4 - 10.8 x10E3/uL 10.0 9.0 12.5(H)  Hemoglobin 13.0 - 17.7 g/dL 12.7(L) 9.5(L) 11.0(L)  Hematocrit 37.5 - 51.0 % 39.5 29.0(L)  33.4(L)  Platelets 150 - 450 x10E3/uL 457(H) 437(H) 387    No results found for: TSH  ==================================================  COVID-19 Education: The signs and symptoms of COVID-19 were discussed with the patient and how to seek  care for testing (follow up with PCP or arrange E-visit).   The importance of social distancing and COVID-19 vaccination was discussed today. The patient is practicing social distancing & Masking.   I spent a total of 21mnutes with the patient spent in direct patient consultation.  Additional time spent with chart review  / charting (studies, outside notes, etc): 18 min Total Time: 47 min   Current medicines are reviewed at length with the patient today.  (+/- concerns) n/a  This visit occurred during the SARS-CoV-2 public health emergency.  Safety protocols were in place, including screening questions prior to the visit, additional usage of staff PPE, and extensive cleaning of exam room while observing appropriate contact time as indicated for disinfecting solutions.  Notice: This dictation was prepared with Dragon dictation along with smaller phrase technology. Any transcriptional errors that result from this process are unintentional and may not be corrected upon review.  Patient Instructions / Medication Changes & Studies & Tests Ordered   Patient Instructions  Medication Instructions:    Start taking  Wellbutrin 150 mg  one tablet twice a day - take the second dose take early evening  after taking medication for one month - do a quit date for not smoking. You can take medication for a full year.  Stop taking Sildenafil   May take Cialis   10 mg  1 to 2 tablet  as needed . Do not take no more than 2 tablets in a 36 hour period.   *If you need a refill on your cardiac medications before your next appointment, please call your pharmacy*   Lab Work: Not needed   Testing/Procedures:  Not needed   Follow-Up: [facility]  and your health needs are our priority.  As part of our continuing mission to provide you with exceptional heart care, we have created designated Provider Care Teams.  These Care Teams include your primary Cardiologist (physician) and Advanced Practice Providers (APPs -  Physician Assistants and Nurse Practitioners) who all work together to provide you with the care you need, when you need it.  We recommend signing up for the patient portal called "MyChart".  Sign up information is provided on this After Visit Summary.  MyChart is used to connect with patients for Virtual Visits (Telemedicine).  Patients are able to view lab/test results, encounter notes, upcoming appointments, etc.  Non-urgent messages can be sent to your provider as well.   To learn more about what you can do with MyChart, go to [email or web address]    Your next appointment:   6 month(s)  The format for your next appointment:   In Person  Provider:   DGlenetta Hew MD   Other Instructions  Your physician discussed the hazards of tobacco use. Tobacco use cessation is recommended and techniques and options to help you quit were discussed.       Studies Ordered:   Orders Placed This Encounter  Procedures   Lipid panel   Comprehensive metabolic panel   Hemoglobin A1c      DGlenetta Hew M.D., M.S. Interventional Cardiologist   Pager # [phone] # [phone] [address] [place] [id number]  [facility] you [facility]!

## 2021-03-26 NOTE — Patient Instructions (Signed)
Medication Instructions:    Start taking  Wellbutrin 150 mg  one tablet twice a day - take the second dose take early evening  after taking medication for one month - do a quit date for not smoking. You can take medication for a full year.  Stop taking Sildenafil   May take Cialis   10 mg  1 to 2 tablet  as needed . Do not take no more than 2 tablets in a 36 hour period.   *If you need a refill on your cardiac medications before your next appointment, please call your pharmacy*   Lab Work: Not needed   Testing/Procedures:  Not needed   Follow-Up: At [facility], you and your health needs are our priority.  As part of our continuing mission to provide you with exceptional heart care, we have created designated Provider Care Teams.  These Care Teams include your primary Cardiologist (physician) and Advanced Practice Providers (APPs -  Physician Assistants and Nurse Practitioners) who all work together to provide you with the care you need, when you need it.  We recommend signing up for the patient portal called "MyChart".  Sign up information is provided on this After Visit Summary.  MyChart is used to connect with patients for Virtual Visits (Telemedicine).  Patients are able to view lab/test results, encounter notes, upcoming appointments, etc.  Non-urgent messages can be sent to your provider as well.   To learn more about what you can do with MyChart, go to [email or web address].    Your next appointment:   6 month(s)  The format for your next appointment:   In Person  Provider:   [name], MD   Other Instructions  Your physician discussed the hazards of tobacco use. Tobacco use cessation is recommended and techniques and options to help you quit were discussed.

## 2021-03-27 ENCOUNTER — Encounter: Payer: Self-pay | Admitting: *Deleted

## 2021-03-28 LAB — HEMOGLOBIN A1C
Est. average glucose Bld gHb Est-mCnc: 126 mg/dL
Hgb A1c MFr Bld: 6 % — ABNORMAL HIGH (ref 4.8–5.6)

## 2021-04-27 ENCOUNTER — Other Ambulatory Visit: Payer: Self-pay | Admitting: Cardiology

## 2021-04-28 ENCOUNTER — Encounter: Payer: Self-pay | Admitting: Cardiology

## 2021-04-28 NOTE — Assessment & Plan Note (Signed)
Multivessel PCI with extensive PCI to the LAD diagonal system.  Unfortunately, with this much PCI, would recommend long-term Plavix as long as he is not having bleeding issues on Eliquis.  Plan: Continue Plavix along with Eliquis and statin, ARB beta-blocker and calcium channel blocker.  No recurrent anginal symptoms.  Has tolerated lung cancer therapy without any major issues.

## 2021-04-28 NOTE — Assessment & Plan Note (Signed)
Blood pressure is back up, and his PCP restarted Azor but lower dose.  He is on now Azor which is a combination of amlodipine-losartan along with metoprolol tartrate 50 mg twice daily.  Will no significant edema.  Well-controlled pressures-no hypotension or orthostatic symptoms.

## 2021-04-28 NOTE — Assessment & Plan Note (Signed)
As far as I can tell, he has not had any breakthrough episodes since his initial episode last year.  Was probably related to all the inflammation from pneumonia and lung cancer.  Maintaining sinus rhythm at this point.  He is on Eliquis for anticoagulation and metoprolol for rate control.  No further bleeding  Okay to hold Eliquis 2 to 3 days preop for surgeries or procedures.

## 2021-04-28 NOTE — Assessment & Plan Note (Signed)
Did not really get much benefit from sildenafil.  We will switch to Cialis.  He is not on long-acting nitrate and has not required any nitrates...   Prescribe Cialis 10 mg 1 to 2 tablets as needed.

## 2021-04-28 NOTE — Assessment & Plan Note (Signed)
History of GI bleed requiring blood transfusion.  This was in the setting of triple therapy using aspirin, Brilinta and Eliquis.  Unfortunately the message did not get to them in time that we were to stop aspirin and Brilinta and switch to Plavix alone along with Eliquis.  No further GI bleed.  Hemoglobin being followed by PCP.

## 2021-04-28 NOTE — Assessment & Plan Note (Signed)
Extensive PCI.     Plan is to maintain lifelong Plavix but okay to hold he is beyond 1 year from PCI.  Is on stable dose of statin, beta-blocker Calcitrel blocker and ARB.

## 2021-04-28 NOTE — Assessment & Plan Note (Addendum)
He is not really smoking that much every day, a little bit upset at himself for getting back into smoking, but with all the stress of everything going on he got back into smoking and is now having a hard time stopping.  Smoking cessation counseling provided along with Wellbutrin to help with cravings.

## 2021-04-28 NOTE — Assessment & Plan Note (Addendum)
Most recent LDL was 59 with total cholesterol 110.  Excellent labs on current dose of atorvastatin.  No change.  Plan to recheck a lipid panel, CMP and A1c in March.

## 2021-04-28 NOTE — Assessment & Plan Note (Signed)
Remains on Eliquis for A. fib as well as Plavix for multivessel CAD and extensive PCI.[name]   He is far enough out from PCI that is okay to hold Plavix 5 to 7 days preop for surgeries or procedures.   With no recurrent A. fib symptoms, safe to stop Eliquis 2 to 3 days preop

## 2021-04-28 NOTE — Assessment & Plan Note (Signed)
He does have a lot of anxiety associated with it.  He is concerned about what will happen about cravings with discontinuation.  Plan: We will start Wellbutrin 150 mg twice daily-he would begin this 1 month prior to a planned stop date for smoking.  He will then continue Wellbutrin for entire year and we can reassess.  We spent close to 10 minutes talking about his concerns and plan.

## 2021-04-29 ENCOUNTER — Other Ambulatory Visit: Payer: Self-pay | Admitting: Cardiology

## 2021-08-03 ENCOUNTER — Other Ambulatory Visit: Payer: Self-pay | Admitting: Cardiology

## 2021-08-05 NOTE — Telephone Encounter (Signed)
Prescription refill request for Eliquis received. Indication:Afib Last office visit:10/22 Scr:0.9 Age: 71 Weight:93.8 kg  Prescription refilled

## 2021-08-30 ENCOUNTER — Other Ambulatory Visit: Payer: Self-pay | Admitting: Urology

## 2021-08-30 DIAGNOSIS — R972 Elevated prostate specific antigen [PSA]: Secondary | ICD-10-CM

## 2021-09-26 ENCOUNTER — Ambulatory Visit
Admission: RE | Admit: 2021-09-26 | Discharge: 2021-09-26 | Disposition: A | Discharge status: Home / Self Care | Payer: Medicare Other | Source: Ambulatory Visit | Attending: Urology | Admitting: Urology

## 2021-09-26 DIAGNOSIS — R972 Elevated prostate specific antigen [PSA]: Secondary | ICD-10-CM

## 2021-09-26 MED ORDER — HEPARIN SOD (PORK) LOCK FLUSH 100 UNIT/ML IV SOLN
500.0000 [IU] | Freq: Once | INTRAVENOUS | Status: AC
  Administered 2021-09-26: 500 [IU] via INTRAVENOUS

## 2021-09-26 MED ORDER — SODIUM CHLORIDE 0.9% FLUSH
10.0000 mL | INTRAVENOUS | Status: DC | PRN
  Administered 2021-09-26: 10 mL via INTRAVENOUS

## 2021-09-26 MED ORDER — GADOBENATE DIMEGLUMINE 529 MG/ML IV SOLN
20.0000 mL | Freq: Once | INTRAVENOUS | Status: AC | PRN
  Administered 2021-09-26: 20 mL via INTRAVENOUS

## 2021-10-03 ENCOUNTER — Other Ambulatory Visit: Payer: Self-pay | Admitting: Cardiology

## 2021-10-04 ENCOUNTER — Other Ambulatory Visit: Payer: Self-pay | Admitting: Cardiology

## 2021-11-22 ENCOUNTER — Encounter: Payer: Self-pay | Admitting: Cardiology

## 2021-11-22 ENCOUNTER — Ambulatory Visit (INDEPENDENT_AMBULATORY_CARE_PROVIDER_SITE_OTHER): Payer: Medicare Other | Admitting: Cardiology

## 2021-11-22 VITALS — BP 128/80 | HR 64 | Ht 74.0 in | Wt 245.8 lb

## 2021-11-22 DIAGNOSIS — I10 Essential (primary) hypertension: Secondary | ICD-10-CM

## 2021-11-22 DIAGNOSIS — I4901 Ventricular fibrillation: Secondary | ICD-10-CM

## 2021-11-22 DIAGNOSIS — Z9861 Coronary angioplasty status: Secondary | ICD-10-CM

## 2021-11-22 DIAGNOSIS — I469 Cardiac arrest, cause unspecified: Secondary | ICD-10-CM | POA: Diagnosis not present

## 2021-11-22 DIAGNOSIS — I2510 Atherosclerotic heart disease of native coronary artery without angina pectoris: Secondary | ICD-10-CM | POA: Diagnosis not present

## 2021-11-22 DIAGNOSIS — I25118 Atherosclerotic heart disease of native coronary artery with other forms of angina pectoris: Secondary | ICD-10-CM

## 2021-11-22 DIAGNOSIS — I480 Paroxysmal atrial fibrillation: Secondary | ICD-10-CM

## 2021-11-22 DIAGNOSIS — E785 Hyperlipidemia, unspecified: Secondary | ICD-10-CM

## 2021-11-22 DIAGNOSIS — C3431 Malignant neoplasm of lower lobe, right bronchus or lung: Secondary | ICD-10-CM

## 2021-11-22 DIAGNOSIS — Z7901 Long term (current) use of anticoagulants: Secondary | ICD-10-CM

## 2021-11-22 DIAGNOSIS — F17200 Nicotine dependence, unspecified, uncomplicated: Secondary | ICD-10-CM

## 2021-11-22 LAB — BASIC METABOLIC PANEL
BUN/Creatinine Ratio: 10 (ref 10–24)
BUN: 10 mg/dL (ref 8–27)
CO2: 26 mmol/L (ref 20–29)
Calcium: 9.3 mg/dL (ref 8.6–10.2)
Chloride: 104 mmol/L (ref 96–106)
Creatinine, Ser: 0.97 mg/dL (ref 0.76–1.27)
Glucose: 89 mg/dL (ref 70–99)
Potassium: 4.9 mmol/L (ref 3.5–5.2)
Sodium: 140 mmol/L (ref 134–144)
eGFR: 85 mL/min/{1.73_m2} (ref >59)

## 2021-11-22 LAB — HEMOGLOBIN A1C
Est. average glucose Bld gHb Est-mCnc: 128 mg/dL
Hgb A1c MFr Bld: 6.1 % — ABNORMAL HIGH (ref 4.8–5.6)

## 2021-11-22 LAB — LIPID PANEL
Chol/HDL Ratio: 2.9 ratio (ref 0.0–5.0)
Cholesterol, Total: 96 mg/dL — ABNORMAL LOW (ref 100–199)
HDL: 33 mg/dL — ABNORMAL LOW (ref >39)
LDL Chol Calc (NIH): 44 mg/dL (ref 0–99)
Triglycerides: 101 mg/dL (ref 0–149)
VLDL Cholesterol Cal: 19 mg/dL (ref 5–40)

## 2021-11-22 LAB — CBC
Hematocrit: 43.2 % (ref 37.5–51.0)
Hemoglobin: 14.1 g/dL (ref 13.0–17.7)
MCH: 25.7 pg — ABNORMAL LOW (ref 26.6–33.0)
MCHC: 32.6 g/dL (ref 31.5–35.7)
MCV: 79 fL (ref 79–97)
Platelets: 228 10*3/uL (ref 150–450)
RBC: 5.48 x10E6/uL (ref 4.14–5.80)
RDW: 16.2 % — ABNORMAL HIGH (ref 11.6–15.4)
WBC: 7.5 10*3/uL (ref 3.4–10.8)

## 2021-11-22 NOTE — Assessment & Plan Note (Signed)
Multivessel CAD with multivessel PCI (essentially Cath Lab CABG) with extensive PCI to the LAD after diagonal system as well as the LCx and RCA.  Thankfully, he has not had any anginal symptoms and really did not recall having anginal symptoms at presentation.  He simply presented with cardiac arrest.  Plan:  Continue current dose of statin with excellent lipid control.  Continue current dose of Lopressor with adequate blood pressure and blood pressure control.  Almost 2-1/3 years out from PCI.  By now I think is stents will of healed and were okay/safe to stop clopidogrel continuing Eliquis only.

## 2021-11-22 NOTE — Assessment & Plan Note (Signed)
Pretty stable blood pressure.  He is no longer on ARB.  Simply on Lopressor.  He was having some issues with hypotension and therefore stopped as part of his treatment for other issues.  Continue to monitor as we may need to go back to ARB in the future, but for now with heart rate of 64 blood pressure in the 120s over 80s, no need to change.

## 2021-11-22 NOTE — Assessment & Plan Note (Signed)
He seems to stabilize out with no symptomatic recurrence of A-fib.  I think this may have just been part parcel of his illness with the cancer. We talked for a while about the management of A-fib and rate with rhythm control.  Since he is not having any recurrences, I explained that there is no real need to consider rhythm control for now.  He asked about how long he would need to be on the medicines and I indicated that metoprolol is for both his coronary disease and A-fib and that we will be stopping Plavix to allow for Eliquis monotherapy desolate bruising orders.   Plan:   Continue current dose of Lopressor 50 mg twice daily.  We will continue with Eliquis but discontinue Plavix

## 2021-11-22 NOTE — Assessment & Plan Note (Addendum)
On Eliquis for A-fib.  More than 2 years out from PCI, okay to stop Plavix.  Okay to hold Eliquis 48 hours preop for surgical procedures.

## 2021-11-22 NOTE — Patient Instructions (Signed)
Medication Instructions:  STOP Plavix *If you need a refill on your cardiac medications before your next appointment, please call your pharmacy*  Lab Work: Your physician recommends that you return for lab work TODAY:  A1c BMP CBC  If you have labs (blood work) drawn today and your tests are completely normal, you will receive your results only by: [place] (if you have MyChart) OR A paper copy in the mail If you have any lab test that is abnormal or we need to change your treatment, we will call you to review the results.  Testing/Procedures: NONE ordered at this time of appointment   Follow-Up: At [facility], you and your health needs are our priority.  As part of our continuing mission to provide you with exceptional heart care, we have created designated Provider Care Teams.  These Care Teams include your primary Cardiologist (physician) and Advanced Practice Providers (APPs -  Physician Assistants and Nurse Practitioners) who all work together to provide you with the care you need, when you need it.  Your next appointment:   6 month(s)  The format for your next appointment:   In Person  Provider:   [name], MD     Other Instructions   Important Information About Sugar

## 2021-11-22 NOTE — Assessment & Plan Note (Signed)
Remarkable presentation with cardiac arrest during recovery from his second COVID-vaccine.  Likely the episode was a vasovagal episode leading to hypotension that led to global ischemia with his significant CAD.  However, there was a clear circumflex occlusion with multivessel disease.  He is now status post CABG with PCI of all 3 major vessels including a diagonal branch as part of bifurcation stenting.  Amazingly, his EF is preserved and he is not having any residual symptoms.  No residual neurologic symptoms.[name]

## 2021-11-22 NOTE — Assessment & Plan Note (Signed)
My initial plan was for lifelong Plavix, however with the need for long-term Eliquis, I think we can probably stop Plavix and continue Eliquis monotherapy.  Stents are well over 70 years old.

## 2021-11-22 NOTE — Assessment & Plan Note (Signed)
Lipids have been pretty well controlled in the past.  He was due for labs recheck today which are reviewed in the addendum.  Remains on atorvastatin 40 mg tolerating well.  Excellent control.  H.

## 2021-11-22 NOTE — Assessment & Plan Note (Signed)
Very few cigarettes.  Should be ready to quit.  I encouraged him to continue to work on quitting.  He is actually in the process of doing counseling and cognitive therapy along with his wife in this endeavor.  They have also consider starting on Chantix.

## 2021-11-22 NOTE — Progress Notes (Signed)
Primary Care Provider: Patient, No Pcp Per (Inactive) ->  Cardiologist: [name], MD Electrophysiologist: None  Clinic Note: Chief Complaint  Patient presents with   Follow-up    Delayed 46-month  Doing well.   Coronary Artery Disease    No angina.  Is active, but not doing routine exercise..   Atrial Fibrillation    He cannot tell any irregular symptoms.   ===================================  ASSESSMENT/PLAN   Problem List Items Addressed This Visit       Cardiology Problems   Cardiac arrest with ventricular fibrillation (HCC) -> 20 minutes CPR with ROSC (Chronic)    Remarkable presentation with cardiac arrest during recovery from his second COVID-vaccine.  Likely the episode was a vasovagal episode leading to hypotension that led to global ischemia with his significant CAD.  However, there was a clear circumflex occlusion with multivessel disease.  He is now status post CABG with PCI of all 3 major vessels including a diagonal branch as part of bifurcation stenting.  Amazingly, his EF is preserved and he is not having any residual symptoms.  No residual neurologic symptoms..      Coronary artery disease of native artery of native heart with stable angina pectoris ([place] - Primary (Chronic)    Multivessel CAD with multivessel PCI (essentially Cath Lab CABG) with extensive PCI to the LAD after diagonal system as well as the LCx and RCA.  Thankfully, he has not had any anginal symptoms and really did not recall having anginal symptoms at presentation.  He simply presented with cardiac arrest.  Plan: Continue current dose of statin with excellent lipid control. Continue current dose of Lopressor with adequate blood pressure and blood pressure control. Almost 2-1/3 years out from PCI.  By now I think is stents will of healed and were okay/safe to stop clopidogrel continuing Eliquis only.      Relevant Orders   EKG 12-Lead   Hemoglobin A1c (Completed)   Basic  metabolic panel (Completed)   CBC (Completed)   CAD S/P 4 V PCI (Chronic)    My initial plan was for lifelong Plavix, however with the need for long-term Eliquis, I think we can probably stop Plavix and continue Eliquis monotherapy.  Stents are well over 272years old.      PAF (paroxysmal atrial fibrillation) ([place]; CHA2DS2VASC 3; Eliquis (Chronic)    He seems to stabilize out with no symptomatic recurrence of A-fib.  I think this may have just been part parcel of his illness with the cancer. We talked for a while about the management of A-fib and rate with rhythm control.  Since he is not having any recurrences, I explained that there is no real need to consider rhythm control for now.  He asked about how long he would need to be on the medicines and I indicated that metoprolol is for both his coronary disease and A-fib and that we will be stopping Plavix to allow for Eliquis monotherapy desolate bruising orders.   Plan:  Continue current dose of Lopressor 50 mg twice daily. We will continue with Eliquis but discontinue Plavix      Relevant Orders   EKG 12-Lead   Essential hypertension (Chronic)    Pretty stable blood pressure.  He is no longer on ARB.  Simply on Lopressor.  He was having some issues with hypotension and therefore stopped as part of his treatment for other issues.  Continue to monitor as we may need to go back to ARB in the  future, but for now with heart rate of 64 blood pressure in the 120s over 80s, no need to change.      Relevant Orders   EKG 12-Lead   Hyperlipidemia with target LDL less than 70 (Chronic)    Lipids have been pretty well controlled in the past.  He was due for labs recheck today which are reviewed in the addendum.  Remains on atorvastatin 40 mg tolerating well.  Excellent control.  H.      Relevant Orders   Lipid panel (Completed)   Hemoglobin A1c (Completed)     Other   Squamous cell carcinoma of bronchus in right lower lobe (HCC) (Chronic)    Current use of long term anticoagulation (Chronic)    On Eliquis for A-fib.  More than 2 years out from PCI, okay to stop Plavix.  Okay to hold Eliquis 48 hours preop for surgical procedures.      Relevant Orders   Basic metabolic panel (Completed)   CBC (Completed)   Current every day smoker (Chronic)    Very few cigarettes.  Should be ready to quit.  I encouraged him to continue to work on quitting.  He is actually in the process of doing counseling and cognitive therapy along with his wife in this endeavor.  They have also consider starting on Chantix.       ===================================  HPI:    John Love is a 71 y.o. male with a PMH below who presents today for delayed 33-monthfollow-up.  08/09/2019: V Fib arrest during 15 min observation period after 2nd COVID Vaccine => apparent syncope - noted to be pulseless -> CPR (confirmed VF) -> ROSC => Lateral STEMI ->  Cath: 3 V CAD: prox RCA 80%, Ost-prox RCA 50%; D1 95% with prox-mid LAD 75%, & mid-distal LCx 80%-OM3 100% => DES PCI to m-dLCx & DES x 2 to RCA -> d/c on 08/19/2019 Echo EF 55-60%. No RWMA.  08/29/2019: Staged Atherectomy-Bifurcation PCI of LAD-Diag 3 Stentrs in LAD & 1 in Ost Diag. 01/28/2020: [facility] => hemoptysis; CT Chest showed lung mass - 2 rounds of Abx => Seen by Dr. GHilton Cork8/19/2021 -> CT showed RLL mass c/w malignancy => PET on 02/22/20; Brilinta held for Bx Bronchoscopy 8/26 -> Dx SSCa in RLL - C lung cancer treatment below. 04/06/2020 - > [place] [facility]- noted to be in Afib => unfortunately, he was initiated  on Eliquis, aspirin and Brilinta Nov 16-25/21: Admitted with GI bleed => aspirin Plavix restarted with plan to restart Eliquis after 1 month and stop aspirin.  Amlodipine was discontinued due to hypotension.  Squamous Cell Lung Cancer (Right Lower Lobe -> T3N2Mo - 02/09/2020)   ASanda Lingerwas last seen on March 26, 2021: He was doing very well from a car  standpoint.  Was back on Azor for blood pressure without any further hypotension.  No CHF or anginal symptoms.  Still having some exertional dyspnea and occasional dizziness with orthostasis, but no syncope or near syncope.  No CHF symptoms of PND, orthopnea or edema.  Almost back to baseline level of energy level.  Stable cough and improving dyspnea.  Getting stronger.  Recent Hospitalizations: None  Reviewed  CV studies:    The following studies were reviewed today: (if available, images/films reviewed: From Epic Chart or Care Everywhere) None:  Interval History:   John DOUGHTENreturns here today with his wife in good spirits.  He is  quite happy and jovial.  He played golf yesterday but indicates that he really only walks from a golf cart to the TV or to the green, and really only does a lot of walking when he is to find his stray golf balls..  He has not had any chest pain or pressure at rest or exertion.  No sense of palpitations or irregular heartbeats to suggest recurrence of A-fib.  No CHF symptoms of PND or and orthopnea but his wife has noted some swelling but seems to be somewhat dependent in nature over the last several months.  He only notes having some difficulty tying his shoe. No PND or orthopnea.  No irregular heartbeats palpitations.  No syncope or near syncope.  No TIA or amaurosis fugax, claudication.  He is very happy that he is now on the 30-monthfollow-up plan for his cancer. No further GI bleeds.   REVIEWED OF SYSTEMS   Review of Systems  Constitutional:  Positive for malaise/fatigue (Does not have a ton of energy, but doing relatively well.). Negative for weight loss (Actually has gained back weight.).  HENT:  Negative for congestion and nosebleeds.   Respiratory:  Negative for cough and shortness of breath.   Cardiovascular:  Positive for leg swelling (feet).  Gastrointestinal:  Positive for constipation (off & on). Negative for blood in stool and melena.   Genitourinary:  Negative for dysuria and hematuria.  Musculoskeletal:  Positive for joint pain and neck pain.  Neurological:  Negative for dizziness (only when he bends over too far or stands up too fast), weakness and headaches.  Psychiatric/Behavioral:  Negative for depression and memory loss. The patient is not nervous/anxious and does not have insomnia.    I have reviewed and (if needed) personally updated the patient's problem list, medications, allergies, past medical and surgical history, social and family history.   PAST MEDICAL HISTORY   Past Medical History:  Diagnosis Date   CAD S/P 4V PCI 08/10/2019   08/09/19: CULPRIT: m-dLCx 80%--OM2 100% @ small branch take-off. --> DES PCI: 2.25 mm x 28 mm Synergy DES (2.75 mm);  pRCA 50%-80% tandem lesions --> DES PCI: 3.5 mm x 24 mm Synergy DES (4.1 mm) 08/29/19: Staged Bifurcation PCI of p-dLAD@D1  with CSI atherectomy and scoring low PTA of ostial D1-  (4 total stents, initial stent from LAD into D1 (Synergy 2.75 x 16-2.9 mm, with 3 overlapping stents in L   Cardiac arrest with ventricular fibrillation (HCC) -> 20 minutes CPR with ROSC 08/09/2019   15 minutes following second COVID-19 vaccination -> vasovagal episode during the hypotension followed by VF arrest; found to have 4 vessel CAD on cath with culprit 100% OM 3--now status post four-vessel PCI   Colon polyps 05/02/2020   ([facility] -colonoscopy performed to evaluate for acute GI bleed, polyps found.  Plan for outpatient colonoscopy.   H/O: upper GI bleed 05/01/2020   Admitted with acute blood loss anemia-4+ units PRBC.  Provocative bleeding scan-small bowel AVMs s/p Cauterization 05/08/2020   Multiple vessel coronary artery disease 08/10/2019   Heart Cath -PCI 08/09/2019:  CULPRIT LESION: Mid Cx to Dist Cx lesion is 80% stenosed. 3rd Mrg lesion is 100% stenosed at small branch take-off. DES PCI: 2.25 mm x 28 mm Synergy DES (proximal postdilated 2.75 mm.  Post intervention, there is a 0%  residual stenosis.  Small side branch was occluded Ost RCA to Prox RCA lesion is 50% stenosed.Prox RCA lesion is 80% stenosed.  DES PCI: 3.5 mm x 24  mm Sy   Paroxysmal atrial fibrillation ([place]) 04/06/2020   [facility] -in A. fib RVR    Squamous cell carcinoma of bronchus in right lower lobe ([place]) 01/2020   Seen on CT scan 01/28/2020-status post bronchoscopy with biopsy -> pathology T3, N2, M0 poorly differentiated; MRI Brain - NO intracranial Mets.   Sustained ventricular fibrillation ([place]) 08/09/2019   In setting of acute MI    PAST SURGICAL HISTORY   Past Surgical History:  Procedure Laterality Date   COLONOSCOPY WITH ESOPHAGOGASTRODUODENOSCOPY (EGD)  05/02/2020   [facility]: No evidence of acute bleeding, colon polyps noted.   CORONARY ATHERECTOMY N/A 08/29/2019   Procedure: CORONARY ATHERECTOMY;  Surgeon: Leonie Man, MD;  Location: [facility];  Service: Cardiovascular; SCORING BALLOON ANGIOPLASTY- ostial D1, CSI ATHERECTOMY p-mLAD -> followed by bifurcation stenting   CORONARY STENT INTERVENTION N/A 08/09/2019   Procedure: CORONARY STENT INTERVENTION;  Surgeon: Lorretta Harp, MD::  2V PCI: Culprit lesion ~100% OM3 (with 80% main LCx) --> DES PCI covering both lesions-Synergy DES 2.25 mm x 28 mm, postdilated to 2.75 mm; LESION #2 proximal RCA 50% followed by 80% --> DES PCI covering both-Synergy DES 3.5 mm x 24 mm   CORONARY STENT INTERVENTION N/A 08/29/2019   Procedure: CORONARY STENT INTERVENTION;  Surgeon: Leonie Man, MD:: Bifurcation DES PCI, Reverse Culotte Technique W/ KB (4 total SYNERGY DES stents, 1st (2.75X16 - 2.9 MM) - pLAD-ostD1 & 3 overlapping in LAD (3.0 x 38, 3.0 x 16, 2.75 x16 - tapered POT postdilation 3.6-3.1 & apical LAD PTA only.   CYSTOSCOPY N/A 08/18/2019   Procedure: CYSTOSCOPY CLOT EVACUATION FULGURATION 0.5-2 CM.;  Surgeon: Festus Aloe, MD;  Location: [facility];  Service: Urology;  Laterality: N/A;   ESOPHAGOGASTRODUODENOSCOPY W/  BANDING  05/08/2020   [facility]: identified Small Bowel AVMs - > Rx with Cauterization   LEFT HEART CATH AND CORONARY ANGIOGRAPHY N/A 08/09/2019   Procedure: RIGHT & LEFT HEART CATH AND CORONARY ANGIOGRAPHY;  Surgeon: Lorretta Harp, MD;  Location: [facility]:: post-arrest : 80% LCx-100% OM3 (DES PCI), pRCA tandem 50 & 80% (DES PCI), Bifurcation LAD (75% calcified)-ostial D1 95%.  LVEDP 11 mmHg, PCWP 15 mmHg.    MESENTERIC ARTERIOGRAM  05/07/2020   [facility]: No evidence of bleed or target ASM for treatment (Presented with ABLA - UGIB)    TRANSTHORACIC ECHOCARDIOGRAM  08/10/2019    Post cardiac arrest: EF 55 to 60%.  No R WMA.  GR 1 DD.  Normal RV.  Normal valves.   TRANSTHORACIC ECHOCARDIOGRAM  02/23/2020   normal LV systolic function with an EF of 55-60%. The left atrium was mildly dilated.   Trivial pericardial effusion.   VIDEO BRONCHOSCOPY WITH RADIAL ENDOBRONCHIAL ULTRASOUND  02/09/2020   [facility]: Dr. Hilton Cork => for RLL Lung mass: Prominent adenopathy in the paratracheal (2R-4R) station, subcarinal station(7), and 11 L.  Complex adenopathy/Mass in R [place] (unable to determine nodes versus direct tumor invasion). EBUS-transBronch Needle Asp @ 11L, 7 & 2R LNs-> No evidence of Malignancy; TO of Bronchus Intermedius w/ both endobronchial tumor & extrinsic compression. Bx taken.   08/09/2019 (post arrest - Imf STEMI)        Staged LAD PCI 08/29/2019       Immunization History  Administered Date(s) Administered   PFIZER(Purple Top)SARS-COV-2 Vaccination 07/19/2019, 08/09/2019    MEDICATIONS/ALLERGIES   Current Meds  Medication Sig   ascorbic acid (VITAMIN C) 500 MG tablet 1 tab(s)   atorvastatin (LIPITOR) 40 MG  tablet TAKE 1 TABLET(40 MG) BY MOUTH DAILY   buPROPion (WELLBUTRIN XL) 150 MG 24 hr tablet Take 1 tablet (150 mg total) by mouth 2 (two) times daily. Take second dose early evening   ELIQUIS 5 MG TABS tablet TAKE 1 TABLET(5 MG) BY MOUTH TWICE DAILY   metoprolol  tartrate (LOPRESSOR) 50 MG tablet TAKE 1 TABLET(50 MG) BY MOUTH TWICE DAILY   metoprolol tartrate (LOPRESSOR) 50 MG tablet TAKE 1 TABLET(50 MG) BY MOUTH TWICE DAILY   Multiple Vitamin (MULTIVITAMIN ADULT PO) Take 2 tablets by mouth daily.   tadalafil (CIALIS) 10 MG tablet May take 1 to 2 tablet as needed for erectile dysfunction. No more than 2 tablets in a 36 hout period.   tamsulosin (FLOMAX) 0.4 MG CAPS capsule Take 1 capsule (0.4 mg total) by mouth daily after supper. (Patient taking differently: Take 0.4 mg by mouth daily.)   triamcinolone (KENALOG) 0.1 % Apply topically 2 (two) times daily.   [DISCONTINUED] clopidogrel (PLAVIX) 75 MG tablet TAKE 4 TABLETS BY MOUTH FIRST DAY THEN START 75 MG TABLET BY MOUTH DAILY    No Known Allergies  SOCIAL HISTORY/FAMILY HISTORY   Reviewed in Epic:  Pertinent findings:  Social History   Tobacco Use   Smoking status: Some Days    Packs/day: 1.50    Types: Cigarettes    Last attempt to quit: 08/09/2019    Years since quitting: 2.2   Smokeless tobacco: Never   Tobacco comments:    Unfortunately, he is back smoking some now.  Vaping Use   Vaping Use: Never used  Substance Use Topics   Alcohol use: Yes    Alcohol/week: 4.0 standard drinks of alcohol    Types: 4 Cans of beer per week    Comment: Daliy   Drug use: Never   Social History   Social History Narrative   Not on file    OBJCTIVE -PE, EKG, labs   Wt Readings from Last 3 Encounters:  11/22/21 245 lb 12.8 oz (111.5 kg)  09/19/20 206 lb 12.8 oz (93.8 kg)  06/18/20 210 lb 12.8 oz (95.6 kg)    Physical Exam: BP 128/80   Pulse 64   Ht 6' 2"  (1.88 m)   Wt 245 lb 12.8 oz (111.5 kg)   SpO2 97%   BMI 31.56 kg/m  Physical Exam Vitals reviewed.  Constitutional:      General: He is not in acute distress.    Appearance: Normal appearance. He is obese. He is not ill-appearing or toxic-appearing.     Comments: Relatively healthy appearing.  No obvious distress.  Not moving as  fast as he once did but doing well.  HENT:     Head: Normocephalic and atraumatic.  Neck:     Vascular: No carotid bruit or JVD.  Cardiovascular:     Rate and Rhythm: Normal rate and regular rhythm. No extrasystoles are present.    Chest Wall: PMI is not displaced.     Pulses: Normal pulses.     Heart sounds: Normal heart sounds, S1 normal and S2 normal. Heart sounds not distant. No murmur heard.    No friction rub.  Pulmonary:     Effort: Pulmonary effort is normal. No respiratory distress.     Breath sounds: No wheezing or rales.     Comments: Mild left basal and right upper lung field rhonchi that cleared with cough. Musculoskeletal:        General: Swelling (Trace to 1+ bilateral pedal edema) present.  Cervical back: Normal range of motion and neck supple.     Comments: Has some cervical kyphosis leading to his head leaning forward.  Skin:    General: Skin is warm and dry.  Neurological:     General: No focal deficit present.     Mental Status: He is alert and oriented to person, place, and time.     Cranial Nerves: No cranial nerve deficit.     Gait: Gait abnormal (Walks somewhat slow and deliberate.  Has some mild cervical kyphosis.).  Psychiatric:        Mood and Affect: Mood normal.        Behavior: Behavior normal.        Thought Content: Thought content normal.        Judgment: Judgment normal.     Comments: Quiet but happy and jovial     Adult ECG Report  Rate: 64 ;  Rhythm: normal sinus rhythm and normal axis, intervals durations. ;   Narrative Interpretation: Stable  Recent Labs: Reviewed.  Due for labs today. Lab Results  Component Value Date   CHOL 120 03/26/2021   HDL 42 03/26/2021   LDLCALC 56 03/26/2021   TRIG 124 03/26/2021   CHOLHDL 2.9 03/26/2021   Lab Results  Component Value Date   CREATININE 0.96 03/26/2021   BUN 9 03/26/2021   NA 139 03/26/2021   K 4.8 03/26/2021   CL 102 03/26/2021   CO2 21 03/26/2021      Latest Ref Rng & Units  12/08/2019    9:04 AM 08/30/2019    2:41 AM 08/18/2019    3:14 AM  CBC  WBC 3.4 - 10.8 x10E3/uL 10.0  9.0  12.5   Hemoglobin 13.0 - 17.7 g/dL 12.7  9.5  11.0   Hematocrit 37.5 - 51.0 % 39.5  29.0  33.4   Platelets 150 - 450 x10E3/uL 457  437  387     Lab Results  Component Value Date   HGBA1C 6.0 (H) 03/26/2021   No results found for: "TSH"  ==================================================  COVID-19 Education: The signs and symptoms of COVID-19 were discussed with the patient and how to seek care for testing (follow up with PCP or arrange E-visit).    I spent a total of 23 minutes with the patient spent in direct patient consultation.  Additional time spent with chart review  / charting (studies, outside notes, etc): 24 min Total Time: 47 min  Current medicines are reviewed at length with the patient today.  (+/- concerns) N/A  This visit occurred during the SARS-CoV-2 public health emergency.  Safety protocols were in place, including screening questions prior to the visit, additional usage of staff PPE, and extensive cleaning of exam room while observing appropriate contact time as indicated for disinfecting solutions.  Notice: This dictation was prepared with Dragon dictation along with smart phrase technology. Any transcriptional errors that result from this process are unintentional and may not be corrected upon review.  Studies Ordered:   Orders Placed This Encounter  Procedures   Lipid panel   Hemoglobin P8K   Basic metabolic panel   CBC   EKG 12-Lead   No orders of the defined types were placed in this encounter.   Patient Instructions / Medication Changes & Studies & Tests Ordered   Patient Instructions  Medication Instructions:  STOP Plavix *If you need a refill on your cardiac medications before your next appointment, please call your pharmacy*  Lab Work: Your physician recommends that  you return for lab work TODAY:  A1c BMP CBC  If you have labs  (blood work) drawn today and your tests are completely normal, you will receive your results only by: [place] (if you have MyChart) OR A paper copy in the mail If you have any lab test that is abnormal or we need to change your treatment, we will call you to review the results.  Testing/Procedures: NONE ordered at this time of appointment   Follow-Up: At [facility], you and your health needs are our priority.  As part of our continuing mission to provide you with exceptional heart care, we have created designated Provider Care Teams.  These Care Teams include your primary Cardiologist (physician) and Advanced Practice Providers (APPs -  Physician Assistants and Nurse Practitioners) who all work together to provide you with the care you need, when you need it.  Your next appointment:   6 month(s)  The format for your next appointment:   In Person  Provider:   [name], MD     Other Instructions   Important Information About Sugar        ADDENDUM Lab Results  Component Value Date   CHOL 96 (L) 11/22/2021   HDL 33 (L) 11/22/2021   LDLCALC 44 11/22/2021   TRIG 101 11/22/2021   CHOLHDL 2.9 11/22/2021   Lab Results  Component Value Date   CREATININE 0.97 11/22/2021   BUN 10 11/22/2021   NA 140 11/22/2021   K 4.9 11/22/2021   CL 104 11/22/2021   CO2 26 11/22/2021       Latest Ref Rng & Units 11/22/2021    9:16 AM 12/08/2019    9:04 AM 08/30/2019    2:41 AM  CBC  WBC 3.4 - 10.8 x10E3/uL 7.5  10.0  9.0   Hemoglobin 13.0 - 17.7 g/dL 14.1  12.7  9.5   Hematocrit 37.5 - 51.0 % 43.2  39.5  29.0   Platelets 150 - 450 x10E3/uL 228  457  437    Lab Results  Component Value Date   HGBA1C 6.1 (H) 11/22/2021  Labs stable.  Okay to continue current medications.  Lipids and A1c stable.    [name], M.D., M.S. Interventional Cardiologist   Pager # [phone] Phone # [phone] [address] [place], [place] [address]   Thank you  for choosing [facility] at [facility]!!

## 2021-11-29 ENCOUNTER — Telehealth: Payer: Self-pay

## 2021-11-29 NOTE — Telephone Encounter (Signed)
Patient with diagnosis of afib on Eliquis for anticoagulation.    Procedure:  MRI Fusion prostate biopsy Date of procedure: TBD   CHA2DS2-VASc Score = 3   This indicates a 3.2% annual risk of stroke. The patient's score is based upon: CHF History: 0 HTN History: 1 Diabetes History: 0 Stroke History: 0 Vascular Disease History: 1 Age Score: 1 Gender Score: 0      CrCl 97 ml/min  Per office protocol, patient can hold Eliquis for 3 days prior to procedure.

## 2021-11-29 NOTE — Telephone Encounter (Signed)
   Pre-operative Risk Assessment    Patient Name: John Love  DOB: December 25, 1950 MRN: [id number]      Request for Surgical Clearance    Procedure:   MRI Fusion prostate biopsy  Date of Surgery:  Clearance TBD                                 Surgeon:  Festus Aloe, MD Surgeon's Group or Practice Name:  [facility] Phone number:  [phone]  Fax number:  [phone]   Type of Clearance Requested:   - Pharmacy:  Hold Apixaban (Eliquis) 3   Type of Anesthesia:  Not Indicated   Additional requests/questions:    Signed, Wonda Horner   11/29/2021, 9:13 AM

## 2021-11-29 NOTE — Telephone Encounter (Signed)
   Primary Cardiologist: [name], MD  Chart reviewed as part of pre-operative protocol coverage. Given past medical history and time since last visit, based on ACC/AHA guidelines, PEYTON SPENGLER would be at acceptable risk for the planned procedure without further cardiovascular testing.   Patient with diagnosis of afib on Eliquis for anticoagulation.     Procedure:  MRI Fusion prostate biopsy Date of procedure: TBD     CHA2DS2-VASc Score = 3   This indicates a 3.2% annual risk of stroke. The patient's score is based upon: CHF History: 0 HTN History: 1 Diabetes History: 0 Stroke History: 0 Vascular Disease History: 1 Age Score: 1 Gender Score: 0     CrCl 97 ml/min   Per office protocol, patient can hold Eliquis for 3 days prior to procedure.  I will route this recommendation to the requesting party via Epic fax function and remove from pre-op pool.  Please call with questions.  Jossie Ng. Spenser Harren NP-C    11/29/2021, 12:57 PM [address] Office [phone] Fax [phone]

## 2021-11-30 ENCOUNTER — Other Ambulatory Visit: Payer: Self-pay

## 2021-11-30 ENCOUNTER — Emergency Department (HOSPITAL_BASED_OUTPATIENT_CLINIC_OR_DEPARTMENT_OTHER)
Admission: EM | Admit: 2021-11-30 | Discharge: 2021-11-30 | Disposition: A | Discharge status: Home / Self Care | Payer: Medicare Other | Attending: Emergency Medicine | Admitting: Emergency Medicine

## 2021-11-30 ENCOUNTER — Encounter (HOSPITAL_BASED_OUTPATIENT_CLINIC_OR_DEPARTMENT_OTHER): Payer: Self-pay | Admitting: Emergency Medicine

## 2021-11-30 ENCOUNTER — Emergency Department (HOSPITAL_BASED_OUTPATIENT_CLINIC_OR_DEPARTMENT_OTHER): Payer: Medicare Other

## 2021-11-30 DIAGNOSIS — Z7901 Long term (current) use of anticoagulants: Secondary | ICD-10-CM | POA: Insufficient documentation

## 2021-11-30 DIAGNOSIS — S59911A Unspecified injury of right forearm, initial encounter: Secondary | ICD-10-CM | POA: Diagnosis present

## 2021-11-30 DIAGNOSIS — S5011XA Contusion of right forearm, initial encounter: Secondary | ICD-10-CM | POA: Diagnosis not present

## 2021-11-30 DIAGNOSIS — I2510 Atherosclerotic heart disease of native coronary artery without angina pectoris: Secondary | ICD-10-CM | POA: Insufficient documentation

## 2021-11-30 NOTE — ED Notes (Signed)
Pt verbalizes understanding of discharge instructions. Opportunity for questioning and answers were provided. Pt discharged from ED to home.   ? ?
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
## 2021-12-14 DIAGNOSIS — C61 Malignant neoplasm of prostate: Secondary | ICD-10-CM

## 2021-12-14 HISTORY — DX: Malignant neoplasm of prostate: C61

## 2022-01-24 NOTE — Progress Notes (Signed)
GU Location of Tumor / Histology: Prostate ca  If Prostate Cancer, Gleason Score is (4 + 3) and PSA is (6.4 as of 08/2021)  Biopsies     Past/Anticipated interventions by urology, if any: NA  Past/Anticipated interventions by medical oncology, if any: NA  Weight changes, if any: {:18581}  IPPS: SHIM:  Bowel/Bladder complaints, if any: {:18581}   Nausea/Vomiting, if any: {:18581}  Pain issues, if any:  {:18581}  SAFETY ISSUES: Prior radiation? {:18581} Pacemaker/ICD? {:18581} Possible current pregnancy? Male Is the patient on methotrexate? No  Current Complaints / other details:  ***

## 2022-01-27 NOTE — Progress Notes (Signed)
Radiation Oncology         [phone] ________________________________  Initial Outpatient Consultation  Name: John Love MRN: [id number]  Date: 01/28/2022  DOB: 1950/11/15  [id number], No Pcp Per  Festus Aloe, MD   REFERRING PHYSICIAN: Festus Aloe, MD  DIAGNOSIS: 71 y.o. gentleman with Stage T1c adenocarcinoma of the prostate with Gleason score of 4+3, and PSA of 6.42.  No diagnosis found.  HISTORY OF PRESENT ILLNESS: John Love is a 71 y.o. male with a diagnosis of prostate cancer. He also has a history of lung cancer. In summary, he was diagnosed in 01/2020 and treated with concurrent chemoradiation with carboplatin/paclitaxel 03/21/20 - 05/11/20 followed by durvalumab through 05/28/21.  He was initially referred to Dr. Junious Silk on 12/22/19 for symptoms of an enlarged prostate. PSA obtained that day showed elevation to 4.83. He was advised to return for repeat PSA in 6 months, but he was subsequently diagnosed with lung cancer and treated. He returned for follow up on 08/15/21,  digital rectal examination was performed at that time revealing no nodules. PSA obtained that day showed further elevation to 6.42. He underwent prostate MRI on 09/26/21 showing: three PI-RADS 4 lesions-- #1 at right posterolateral peripheral zone in mid and apex, #2 at left anterior peripheral zone and left anterior fibromuscular stroma of apex, and #3 at right posterolateral peripheral zone at apex; prostate volume 82g. The patient proceeded to MRI fusion biopsy of the prostate on 12/31/21.  The prostate volume measured 116.46 cc by ultrasound.  Out of 18 core biopsies, 6 were positive.  The maximum Gleason score was 4+3, and this was seen in right apex lateral. Additionally, Gleason 3+4 was seen in both ROI #3 cores (with perineural invasion), and Gleason 3+3 in one ROI #1 core, right base lateral, and right mid lateral (with PNI).  The patient reviewed the biopsy results with his urologist and he  has kindly been referred today for discussion of potential radiation treatment options.   PREVIOUS RADIATION THERAPY: Yes  03/21/20 - 05/11/20: Right lung and mediastinum / 60 Gy in 30 fractions  PAST MEDICAL HISTORY:  Past Medical History:  Diagnosis Date   CAD S/P 4V PCI 08/10/2019   08/09/19: CULPRIT: m-dLCx 80%--OM2 100% @ small branch take-off. --> DES PCI: 2.25 mm x 28 mm Synergy DES (2.75 mm);  pRCA 50%-80% tandem lesions --> DES PCI: 3.5 mm x 24 mm Synergy DES (4.1 mm) 08/29/19: Staged Bifurcation PCI of p-dLAD_0  with CSI atherectomy and scoring low PTA of ostial D1-  (4 total stents, initial stent from LAD into D1 (Synergy 2.75 x 16-2.9 mm, with 3 overlapping stents in L   Cardiac arrest with ventricular fibrillation (HCC) -> 20 minutes CPR with ROSC 08/09/2019   15 minutes following second COVID-19 vaccination -> vasovagal episode during the hypotension followed by VF arrest; found to have 4 vessel CAD on cath with culprit 100% OM 3--now status post four-vessel PCI   Colon polyps 05/02/2020   [facility]) -colonoscopy performed to evaluate for acute GI bleed, polyps found.  Plan for outpatient colonoscopy.   H/O: upper GI bleed 05/01/2020   Admitted with acute blood loss anemia-4+ units PRBC.  Provocative bleeding scan-small bowel AVMs s/p Cauterization 05/08/2020   Multiple vessel coronary artery disease 08/10/2019   Heart Cath -PCI 08/09/2019:  CULPRIT LESION: Mid Cx to Dist Cx lesion is 80% stenosed. 3rd Mrg lesion is 100% stenosed at small branch take-off. DES PCI: 2.25 mm x 28 mm Synergy DES (proximal postdilated  2.75 mm.  Post intervention, there is a 0% residual stenosis.  Small side branch was occluded Ost RCA to Prox RCA lesion is 50% stenosed.Prox RCA lesion is 80% stenosed.  DES PCI: 3.5 mm x 24 mm Sy   Paroxysmal atrial fibrillation ([place]) 04/06/2020   [facility] -in A. fib RVR    Squamous cell carcinoma of bronchus in right lower lobe ([place]) 01/2020   Seen on CT scan  01/28/2020-status post bronchoscopy with biopsy -> pathology T3, N2, M0 poorly differentiated; MRI Brain - NO intracranial Mets.   Sustained ventricular fibrillation ([place]) 08/09/2019   In setting of acute MI      PAST SURGICAL HISTORY: Past Surgical History:  Procedure Laterality Date   COLONOSCOPY WITH ESOPHAGOGASTRODUODENOSCOPY (EGD)  05/02/2020   [facility]: No evidence of acute bleeding, colon polyps noted.   CORONARY ATHERECTOMY N/A 08/29/2019   Procedure: CORONARY ATHERECTOMY;  Surgeon: Leonie Man, MD;  Location: [facility];  Service: Cardiovascular; SCORING BALLOON ANGIOPLASTY- ostial D1, CSI ATHERECTOMY p-mLAD -> followed by bifurcation stenting   CORONARY STENT INTERVENTION N/A 08/09/2019   Procedure: CORONARY STENT INTERVENTION;  Surgeon: Lorretta Harp, MD::  2V PCI: Culprit lesion ~100% OM3 (with 80% main LCx) --> DES PCI covering both lesions-Synergy DES 2.25 mm x 28 mm, postdilated to 2.75 mm; LESION #2 proximal RCA 50% followed by 80% --> DES PCI covering both-Synergy DES 3.5 mm x 24 mm   CORONARY STENT INTERVENTION N/A 08/29/2019   Procedure: CORONARY STENT INTERVENTION;  Surgeon: Leonie Man, MD:: Bifurcation DES PCI, Reverse Culotte Technique W/ KB (4 total SYNERGY DES stents, 1st (2.75X16 - 2.9 MM) - pLAD-ostD1 & 3 overlapping in LAD (3.0 x 38, 3.0 x 16, 2.75 x16 - tapered POT postdilation 3.6-3.1 & apical LAD PTA only.   CYSTOSCOPY N/A 08/18/2019   Procedure: CYSTOSCOPY CLOT EVACUATION FULGURATION 0.5-2 CM.;  Surgeon: Festus Aloe, MD;  Location: [facility];  Service: Urology;  Laterality: N/A;   ESOPHAGOGASTRODUODENOSCOPY W/ BANDING  05/08/2020   [facility]: identified Small Bowel AVMs - > Rx with Cauterization   LEFT HEART CATH AND CORONARY ANGIOGRAPHY N/A 08/09/2019   Procedure: RIGHT & LEFT HEART CATH AND CORONARY ANGIOGRAPHY;  Surgeon: Lorretta Harp, MD;  Location: [facility]:: post-arrest : 80% LCx-100% OM3 (DES PCI), pRCA tandem 50 & 80% (DES  PCI), Bifurcation LAD (75% calcified)-ostial D1 95%.  LVEDP 11 mmHg, PCWP 15 mmHg.    MESENTERIC ARTERIOGRAM  05/07/2020   [facility]: No evidence of bleed or target ASM for treatment (Presented with ABLA - UGIB)    TRANSTHORACIC ECHOCARDIOGRAM  08/10/2019    Post cardiac arrest: EF 55 to 60%.  No R WMA.  GR 1 DD.  Normal RV.  Normal valves.   TRANSTHORACIC ECHOCARDIOGRAM  02/23/2020   normal LV systolic function with an EF of 55-60%. The left atrium was mildly dilated.   Trivial pericardial effusion.   VIDEO BRONCHOSCOPY WITH RADIAL ENDOBRONCHIAL ULTRASOUND  02/09/2020   [facility]: Dr. Hilton Cork => for RLL Lung mass: Prominent adenopathy in the paratracheal (2R-4R) station, subcarinal station(7), and 11 L.  Complex adenopathy/Mass in R [place] (unable to determine nodes versus direct tumor invasion). EBUS-transBronch Needle Asp @ 11L, 7 & 2R LNs-> No evidence of Malignancy; TO of Bronchus Intermedius w/ both endobronchial tumor & extrinsic compression. Bx taken.    FAMILY HISTORY:  Family History  Problem Relation Age of Onset   Heart disease Father        died  in his 74's during circumscion procedure    SOCIAL HISTORY:  Social History   Socioeconomic History   Marital status: Married    Spouse name: Not on file   Number of children: Not on file   Years of education: Not on file   Highest education level: Not on file  Occupational History   Not on file  Tobacco Use   Smoking status: Some Days    Packs/day: 1.50    Types: Cigarettes    Last attempt to quit: 08/09/2019    Years since quitting: 2.4   Smokeless tobacco: Never   Tobacco comments:    Unfortunately, he is back smoking some now.  Vaping Use   Vaping Use: Never used  Substance and Sexual Activity   Alcohol use: Yes    Alcohol/week: 4.0 standard drinks of alcohol    Types: 4 Cans of beer per week    Comment: [place]   Drug use: Never   Sexual activity: Not on file  Other Topics Concern   Not on file  Social History  Narrative   Not on file   Social Determinants of Health   Financial Resource Strain: Not on file  Food Insecurity: Not on file  Transportation Needs: Not on file  Physical Activity: Not on file  Stress: Not on file  Social Connections: Not on file  Intimate Partner Violence: Not on file    ALLERGIES: Patient has no known allergies.  MEDICATIONS:  Current Outpatient Medications  Medication Sig Dispense Refill   ascorbic acid (VITAMIN C) 500 MG tablet 1 tab(s)     atorvastatin (LIPITOR) 40 MG tablet TAKE 1 TABLET(40 MG) BY MOUTH DAILY 90 tablet 3   buPROPion (WELLBUTRIN XL) 150 MG 24 hr tablet Take 1 tablet (150 mg total) by mouth 2 (two) times daily. Take second dose early evening 180 tablet 3   ELIQUIS 5 MG TABS tablet TAKE 1 TABLET(5 MG) BY MOUTH TWICE DAILY 180 tablet 1   metoprolol tartrate (LOPRESSOR) 50 MG tablet TAKE 1 TABLET(50 MG) BY MOUTH TWICE DAILY 180 tablet 3   metoprolol tartrate (LOPRESSOR) 50 MG tablet TAKE 1 TABLET(50 MG) BY MOUTH TWICE DAILY 180 tablet 3   Multiple Vitamin (MULTIVITAMIN ADULT PO) Take 2 tablets by mouth daily.     tadalafil (CIALIS) 10 MG tablet May take 1 to 2 tablet as needed for erectile dysfunction. No more than 2 tablets in a 36 hout period. 10 tablet 11   tamsulosin (FLOMAX) 0.4 MG CAPS capsule Take 1 capsule (0.4 mg total) by mouth daily after supper. (Patient taking differently: Take 0.4 mg by mouth daily.) 30 capsule 6   triamcinolone (KENALOG) 0.1 % Apply topically 2 (two) times daily.     No current facility-administered medications for this encounter.    REVIEW OF SYSTEMS:  On review of systems, the patient reports that he is doing well overall. He denies any chest pain, shortness of breath, cough, fevers, chills, night sweats, unintended weight changes. He denies any bowel disturbances, and denies abdominal pain, nausea or vomiting. He denies any new musculoskeletal or joint aches or pains. His IPSS was 20, indicating moderate-severe  urinary symptoms. His SHIM was 11, indicating he has moderate-severe erectile dysfunction. A complete review of systems is obtained and is otherwise negative.    PHYSICAL EXAM:  Wt Readings from Last 3 Encounters:  11/22/21 245 lb 12.8 oz (111.5 kg)  09/19/20 206 lb 12.8 oz (93.8 kg)  06/18/20 210 lb 12.8 oz (95.6 kg)  Temp Readings from Last 3 Encounters:  11/30/21 98.1 F (36.7 C) (Oral)  12/12/19 (!) 97.1 F (36.2 C)  08/30/19 98.1 F (36.7 C) (Oral)   BP Readings from Last 3 Encounters:  11/30/21 132/85  11/22/21 128/80  03/26/21 138/72   Pulse Readings from Last 3 Encounters:  11/30/21 88  11/22/21 64  03/26/21 80    /10  In general this is a well appearing *** male in no acute distress. He's alert and oriented x4 and appropriate throughout the examination. Cardiopulmonary assessment is negative for acute distress, and he exhibits normal effort.     KPS = ***  100 - Normal; no complaints; no evidence of disease. 90   - Able to carry on normal activity; minor signs or symptoms of disease. 80   - Normal activity with effort; some signs or symptoms of disease. 14   - Cares for self; unable to carry on normal activity or to do active work. 60   - Requires occasional assistance, but is able to care for most of his personal needs. 50   - Requires considerable assistance and frequent medical care. 55   - Disabled; requires special care and assistance. 46   - Severely disabled; hospital admission is indicated although death not imminent. 43   - Very sick; hospital admission necessary; active supportive treatment necessary. 10   - Moribund; fatal processes progressing rapidly. 0     - Dead  Karnofsky DA, Abelmann [place], Craver LS and Burchenal [facility] [phone]) The use of the nitrogen mustards in the palliative treatment of carcinoma: with particular reference to bronchogenic carcinoma Cancer 1 634-56  LABORATORY DATA:  Lab Results  Component Value Date   WBC 7.5 11/22/2021    HGB 14.1 11/22/2021   HCT 43.2 11/22/2021   MCV 79 11/22/2021   PLT 228 11/22/2021   Lab Results  Component Value Date   NA 140 11/22/2021   K 4.9 11/22/2021   CL 104 11/22/2021   CO2 26 11/22/2021   Lab Results  Component Value Date   ALT 22 03/26/2021   AST 21 03/26/2021   ALKPHOS 114 03/26/2021   BILITOT 0.6 03/26/2021     RADIOGRAPHY: No results found.    IMPRESSION/PLAN: 1. 71 y.o. gentleman with Stage T1c adenocarcinoma of the prostate with Gleason Score of 4+3, and PSA of 6.42. We discussed the patient's workup and outlined the nature of prostate cancer in this setting. The patient's T stage, Gleason's score, and PSA put him into the intermediate risk group. Accordingly, he is eligible for a variety of potential treatment options including ST-ADT in combination with brachytherapy, 5.5 weeks of external radiation, or prostatectomy. We discussed the available radiation techniques, and focused on the details and logistics of delivery. The patient may not be an ideal candidate for brachytherapy boost with a prostate volume of 116.5 cc***. We discussed and outlined the risks, benefits, short and long-term effects associated with radiotherapy and compared and contrasted these with prostatectomy. We discussed the role of SpaceOAR gel in reducing the rectal toxicity associated with radiotherapy. We also detailed the role of ADT in the treatment of intermediate risk prostate cancer and outlined the associated side effects that could be expected with this therapy. He appears to have a good understanding of his disease and our treatment recommendations which are of curative intent.  He was encouraged to ask questions that were answered to his stated satisfaction.  At the conclusion of our conversation, the patient is interested in  moving forward with 5.5 weeks of external beam therapy in combination with ST-ADT. He has not received his first Lupron injection. We will share our discussion with  Dr. Junious Silk and make arrangements for start of ADT and fiducial marker with SpaceOAR gel placement, prior to simulation, to reduce rectal toxicity from radiotherapy. The patient appears to have a good understanding of his disease and our treatment recommendations which are of curative intent and is in agreement with the stated plan.  Therefore, we will move forward with treatment planning accordingly, in anticipation of beginning IMRT approximately 2 months after starting ADT.   We personally spent *** minutes in this encounter including chart review, reviewing radiological studies, meeting face-to-face with the patient, entering orders and completing documentation.    Nicholos Johns, PA-C    Tyler Pita, MD  [facility] Direct Dial: [phone]  Fax: [phone] [email or web address]  Skype  [email or web address]   This document serves as a record of services personally performed by Tyler Pita, MD and Freeman Caldron, PA-C. It was created on their behalf by Wilburn Mylar, a trained medical scribe. The creation of this record is based on the scribe's personal observations and the provider's statements to them. This document has been checked and approved by the attending provider.

## 2022-01-27 NOTE — Progress Notes (Incomplete)
Radiation Oncology         [phone] ________________________________  Initial Outpatient Consultation  Name: John Love MRN: [id number]  Date: 01/28/2022  DOB: 23-Sep-1950  WC:HENIDPO, No Pcp Per  Festus Aloe, MD   REFERRING PHYSICIAN: Festus Aloe, MD  DIAGNOSIS: 71 y.o. gentleman with Stage T*** adenocarcinoma of the prostate with Gleason score of ***+***, and PSA of ***.  No diagnosis found.  HISTORY OF PRESENT ILLNESS: John Love is a 71 y.o. male with a diagnosis of prostate cancer. He also has a history of lung cancer. In summary, he was diagnosed in 01/2020 and treated with concurrent chemoradiation with carboplatin/paclitaxel 03/21/20 - 05/11/20 followed by durvalumab through 05/28/21.  He was initially referred to Dr. Junious Silk on 12/22/19 for symptoms of an enlarged prostate. PSA obtained that day showed elevation to 4.83. He was advised to return for repeat PSA in 6 months, but he was subsequently diagnosed with lung cancer and treated. He returned for follow up on 08/15/21,  digital rectal examination was performed at that time revealing no nodules. PSA obtained that day showed further elevation to 6.42. He underwent prostate MRI on 09/26/21 showing: three PI-RADS 4 lesions-- #1 at right posterolateral peripheral zone in mid and apex, #2 at left anterior peripheral zone and left anterior fibromuscular stroma of apex, and #3 at right posterolateral peripheral zone at apex. The patient proceeded to transrectal ultrasound with 12 biopsies of the prostate on ***.  The prostate volume measured *** cc.  Out of 12 core biopsies, *** were positive.  The maximum Gleason score was ***, and this was seen in ***.  The patient reviewed the biopsy results with his urologist and he has kindly been referred today for discussion of potential radiation treatment options.   PREVIOUS RADIATION THERAPY: Yes  03/21/20 - 05/11/20: Right lung and mediastinum / 60 Gy in 30  fractions  PAST MEDICAL HISTORY:  Past Medical History:  Diagnosis Date  . CAD S/P 4V PCI 08/10/2019   08/09/19: CULPRIT: m-dLCx 80%--OM2 100% @ small branch take-off. --> DES PCI: 2.25 mm x 28 mm Synergy DES (2.75 mm);  pRCA 50%-80% tandem lesions --> DES PCI: 3.5 mm x 24 mm Synergy DES (4.1 mm) 08/29/19: Staged Bifurcation PCI of p-dLAD_0  with CSI atherectomy and scoring low PTA of ostial D1-  (4 total stents, initial stent from LAD into D1 (Synergy 2.75 x 16-2.9 mm, with 3 overlapping stents in L  . Cardiac arrest with ventricular fibrillation (HCC) -> 20 minutes CPR with ROSC 08/09/2019   15 minutes following second COVID-19 vaccination -> vasovagal episode during the hypotension followed by VF arrest; found to have 4 vessel CAD on cath with culprit 100% OM 3--now status post four-vessel PCI  . Colon polyps 05/02/2020   [facility]) -colonoscopy performed to evaluate for acute GI bleed, polyps found.  Plan for outpatient colonoscopy.  . H/O: upper GI bleed 05/01/2020   Admitted with acute blood loss anemia-4+ units PRBC.  Provocative bleeding scan-small bowel AVMs s/p Cauterization 05/08/2020  . Multiple vessel coronary artery disease 08/10/2019   Heart Cath -PCI 08/09/2019:  CULPRIT LESION: Mid Cx to Dist Cx lesion is 80% stenosed. 3rd Mrg lesion is 100% stenosed at small branch take-off. DES PCI: 2.25 mm x 28 mm Synergy DES (proximal postdilated 2.75 mm.  Post intervention, there is a 0% residual stenosis.  Small side branch was occluded Ost RCA to Prox RCA lesion is 50% stenosed.Prox RCA lesion is 80% stenosed.  DES PCI: 3.5  mm x 24 mm Sy  . Paroxysmal atrial fibrillation ([place]) 04/06/2020   Presented to [facility] -in A. fib RVR   . Squamous cell carcinoma of bronchus in right lower lobe ([place]) 01/2020   Seen on CT scan 01/28/2020-status post bronchoscopy with biopsy -> pathology T3, N2, M0 poorly differentiated; MRI Brain - NO intracranial Mets.  . Sustained ventricular fibrillation ([place])  08/09/2019   In setting of acute MI      PAST SURGICAL HISTORY: Past Surgical History:  Procedure Laterality Date  . COLONOSCOPY WITH ESOPHAGOGASTRODUODENOSCOPY (EGD)  05/02/2020   [facility]: No evidence of acute bleeding, colon polyps noted.  . CORONARY ATHERECTOMY N/A 08/29/2019   Procedure: CORONARY ATHERECTOMY;  Surgeon: Leonie Man, MD;  Location: [place] [facility];  Service: Cardiovascular; SCORING BALLOON ANGIOPLASTY- ostial D1, CSI ATHERECTOMY p-mLAD -> followed by bifurcation stenting  . CORONARY STENT INTERVENTION N/A 08/09/2019   Procedure: CORONARY STENT INTERVENTION;  Surgeon: Lorretta Harp, MD::  2V PCI: Culprit lesion ~100% OM3 (with 80% main LCx) --> DES PCI covering both lesions-Synergy DES 2.25 mm x 28 mm, postdilated to 2.75 mm; LESION #2 proximal RCA 50% followed by 80% --> DES PCI covering both-Synergy DES 3.5 mm x 24 mm  . CORONARY STENT INTERVENTION N/A 08/29/2019   Procedure: CORONARY STENT INTERVENTION;  Surgeon: Leonie Man, MD:: Bifurcation DES PCI, Reverse Culotte Technique W/ KB (4 total SYNERGY DES stents, 1st (2.75X16 - 2.9 MM) - pLAD-ostD1 & 3 overlapping in LAD (3.0 x 38, 3.0 x 16, 2.75 x16 - tapered POT postdilation 3.6-3.1 & apical LAD PTA only.  . CYSTOSCOPY N/A 08/18/2019   Procedure: CYSTOSCOPY CLOT EVACUATION FULGURATION 0.5-2 CM.;  Surgeon: Festus Aloe, MD;  Location: [place];  Service: Urology;  Laterality: N/A;  . ESOPHAGOGASTRODUODENOSCOPY W/ BANDING  05/08/2020   [facility]: identified Small Bowel AVMs - > Rx with Cauterization  . LEFT HEART CATH AND CORONARY ANGIOGRAPHY N/A 08/09/2019   Procedure: RIGHT & LEFT HEART CATH AND CORONARY ANGIOGRAPHY;  Surgeon: Lorretta Harp, MD;  Location: [place] [facility]:: post-arrest : 80% LCx-100% OM3 (DES PCI), pRCA tandem 50 & 80% (DES PCI), Bifurcation LAD (75% calcified)-ostial D1 95%.  LVEDP 11 mmHg, PCWP 15 mmHg.   [name] MESENTERIC ARTERIOGRAM  05/07/2020   [facility]: No evidence of bleed or target  ASM for treatment (Presented with ABLA - UGIB)   . TRANSTHORACIC ECHOCARDIOGRAM  08/10/2019    Post cardiac arrest: EF 55 to 60%.  No R WMA.  GR 1 DD.  Normal RV.  Normal valves.  . TRANSTHORACIC ECHOCARDIOGRAM  02/23/2020   normal LV systolic function with an EF of 55-60%. The left atrium was mildly dilated.   Trivial pericardial effusion.  [name] VIDEO BRONCHOSCOPY WITH RADIAL ENDOBRONCHIAL ULTRASOUND  02/09/2020   [facility]: Dr. Hilton Cork => for RLL Lung mass: Prominent adenopathy in the paratracheal (2R-4R) station, subcarinal station(7), and 11 L.  Complex adenopathy/Mass in R [place] (unable to determine nodes versus direct tumor invasion). EBUS-transBronch Needle Asp @ 11L, 7 & 2R LNs-> No evidence of Malignancy; TO of Bronchus Intermedius w/ both endobronchial tumor & extrinsic compression. Bx taken.    FAMILY HISTORY:  Family History  Problem Relation Age of Onset  . Heart disease Father        died in his 47's during circumscion procedure    SOCIAL HISTORY:  Social History   Socioeconomic History  . Marital status: Married    Spouse name: Not on file  . Number of  children: Not on file  . Years of education: Not on file  . Highest education level: Not on file  Occupational History  . Not on file  Tobacco Use  . Smoking status: Some Days    Packs/day: 1.50    Types: Cigarettes    Last attempt to quit: 08/09/2019    Years since quitting: 2.4  . Smokeless tobacco: Never  . Tobacco comments:    Unfortunately, he is back smoking some now.  Vaping Use  . Vaping Use: Never used  Substance and Sexual Activity  . Alcohol use: Yes    Alcohol/week: 4.0 standard drinks of alcohol    Types: 4 Cans of beer per week    Comment: [place]  . Drug use: Never  . Sexual activity: Not on file  Other Topics Concern  . Not on file  Social History Narrative  . Not on file   Social Determinants of Health   Financial Resource Strain: Not on file  Food Insecurity: Not on file  Transportation  Needs: Not on file  Physical Activity: Not on file  Stress: Not on file  Social Connections: Not on file  Intimate Partner Violence: Not on file    ALLERGIES: Patient has no known allergies.  MEDICATIONS:  Current Outpatient Medications  Medication Sig Dispense Refill  . ascorbic acid (VITAMIN C) 500 MG tablet 1 tab(s)    . atorvastatin (LIPITOR) 40 MG tablet TAKE 1 TABLET(40 MG) BY MOUTH DAILY 90 tablet 3  . buPROPion (WELLBUTRIN XL) 150 MG 24 hr tablet Take 1 tablet (150 mg total) by mouth 2 (two) times daily. Take second dose early evening 180 tablet 3  . ELIQUIS 5 MG TABS tablet TAKE 1 TABLET(5 MG) BY MOUTH TWICE DAILY 180 tablet 1  . metoprolol tartrate (LOPRESSOR) 50 MG tablet TAKE 1 TABLET(50 MG) BY MOUTH TWICE DAILY 180 tablet 3  . metoprolol tartrate (LOPRESSOR) 50 MG tablet TAKE 1 TABLET(50 MG) BY MOUTH TWICE DAILY 180 tablet 3  . Multiple Vitamin (MULTIVITAMIN ADULT PO) Take 2 tablets by mouth daily.    . tadalafil (CIALIS) 10 MG tablet May take 1 to 2 tablet as needed for erectile dysfunction. No more than 2 tablets in a 36 hout period. 10 tablet 11  . tamsulosin (FLOMAX) 0.4 MG CAPS capsule Take 1 capsule (0.4 mg total) by mouth daily after supper. (Patient taking differently: Take 0.4 mg by mouth daily.) 30 capsule 6  . triamcinolone (KENALOG) 0.1 % Apply topically 2 (two) times daily.     No current facility-administered medications for this encounter.    REVIEW OF SYSTEMS:  On review of systems, the patient reports that he is doing well overall. He denies any chest pain, shortness of breath, cough, fevers, chills, night sweats, unintended weight changes. He denies any bowel disturbances, and denies abdominal pain, nausea or vomiting. He denies any new musculoskeletal or joint aches or pains. His IPSS was ***, indicating *** urinary symptoms. His SHIM was ***, indicating he {does not have/has mild/moderate/severe} erectile dysfunction. A complete review of systems is obtained  and is otherwise negative.    PHYSICAL EXAM:  Wt Readings from Last 3 Encounters:  11/22/21 245 lb 12.8 oz (111.5 kg)  09/19/20 206 lb 12.8 oz (93.8 kg)  06/18/20 210 lb 12.8 oz (95.6 kg)   Temp Readings from Last 3 Encounters:  11/30/21 98.1 F (36.7 C) (Oral)  12/12/19 (!) 97.1 F (36.2 C)  08/30/19 98.1 F (36.7 C) (Oral)   BP Readings  from Last 3 Encounters:  11/30/21 132/85  11/22/21 128/80  03/26/21 138/72   Pulse Readings from Last 3 Encounters:  11/30/21 88  11/22/21 64  03/26/21 80    /10  In general this is a well appearing *** male in no acute distress. He's alert and oriented x4 and appropriate throughout the examination. Cardiopulmonary assessment is negative for acute distress, and he exhibits normal effort.     KPS = ***  100 - Normal; no complaints; no evidence of disease. 90   - Able to carry on normal activity; minor signs or symptoms of disease. 80   - Normal activity with effort; some signs or symptoms of disease. 87   - Cares for self; unable to carry on normal activity or to do active work. 60   - Requires occasional assistance, but is able to care for most of his personal needs. 50   - Requires considerable assistance and frequent medical care. 83   - Disabled; requires special care and assistance. 65   - Severely disabled; hospital admission is indicated although death not imminent. 39   - Very sick; hospital admission necessary; active supportive treatment necessary. 10   - Moribund; fatal processes progressing rapidly. 0     - Dead  Karnofsky DA, Abelmann [place], Craver LS and Burchenal [facility] [phone]) The use of the nitrogen mustards in the palliative treatment of carcinoma: with particular reference to bronchogenic carcinoma Cancer 1 634-56  LABORATORY DATA:  Lab Results  Component Value Date   WBC 7.5 11/22/2021   HGB 14.1 11/22/2021   HCT 43.2 11/22/2021   MCV 79 11/22/2021   PLT 228 11/22/2021   Lab Results  Component Value Date   NA 140  11/22/2021   K 4.9 11/22/2021   CL 104 11/22/2021   CO2 26 11/22/2021   Lab Results  Component Value Date   ALT 22 03/26/2021   AST 21 03/26/2021   ALKPHOS 114 03/26/2021   BILITOT 0.6 03/26/2021     RADIOGRAPHY: No results found.    IMPRESSION/PLAN: 1. 71 y.o. gentleman with Stage T*** adenocarcinoma of the prostate with Gleason Score of ***+***, and PSA of ***. We discussed the patient's workup and outlined the nature of prostate cancer in this setting. The patient's T stage, Gleason's score, and PSA put him into the *** risk group. Accordingly, he is eligible for a variety of potential treatment options including {ADT in combination with} brachytherapy, 5.5-8 weeks of external radiation, {5 weeks of external radiation with an upfront brachytherapy boost,} or prostatectomy. We discussed the available radiation techniques, and focused on the details and logistics of delivery. {The patient may not be an ideal candidate for brachytherapy boost with a prostate volume of *** prior to downsizing from hormone therapy. We discussed that based on his prostate volume, he would require beginning treatment with a 5 alpha reductase inhibitor and ADT for at least 3 months to allow for downsizing of the prostate prior to initiating radiotherapy.} We discussed and outlined the risks, benefits, short and long-term effects associated with radiotherapy and compared and contrasted these with prostatectomy. We discussed the role of SpaceOAR gel in reducing the rectal toxicity associated with radiotherapy. {We also detailed the role of ADT in the treatment of *** risk prostate cancer and outlined the associated side effects that could be expected with this therapy.}  He appears to have a good understanding of his disease and our treatment recommendations which are of curative intent.  He was encouraged  to ask questions that were answered to his stated satisfaction.  At the conclusion of our conversation, the patient  is interested in moving forward with ***.  We personally spent *** minutes in this encounter including chart review, reviewing radiological studies, meeting face-to-face with the patient, entering orders and completing documentation.    Nicholos Johns, PA-C    Tyler Pita, MD  [facility] Direct Dial: [phone]  Fax: [phone] [email or web address]  Skype  LinkedIn   This document serves as a record of services personally performed by Tyler Pita, MD and Freeman Caldron, PA-C. It was created on their behalf by Wilburn Mylar, a trained medical scribe. The creation of this record is based on the scribe's personal observations and the provider's statements to them. This document has been checked and approved by the attending provider.

## 2022-01-28 ENCOUNTER — Ambulatory Visit
Admission: RE | Admit: 2022-01-28 | Discharge: 2022-01-28 | Disposition: A | Discharge status: Home / Self Care | Payer: Medicare Other | Source: Ambulatory Visit | Attending: Radiation Oncology | Admitting: Radiation Oncology

## 2022-01-28 ENCOUNTER — Other Ambulatory Visit: Payer: Self-pay

## 2022-01-28 VITALS — BP 129/73 | HR 80 | Temp 97.7°F | Resp 18 | Ht 74.0 in | Wt 244.5 lb

## 2022-01-28 DIAGNOSIS — Z86010 Personal history of colonic polyps: Secondary | ICD-10-CM | POA: Insufficient documentation

## 2022-01-28 DIAGNOSIS — Z79899 Other long term (current) drug therapy: Secondary | ICD-10-CM | POA: Diagnosis not present

## 2022-01-28 DIAGNOSIS — Z87891 Personal history of nicotine dependence: Secondary | ICD-10-CM | POA: Diagnosis not present

## 2022-01-28 DIAGNOSIS — Z8674 Personal history of sudden cardiac arrest: Secondary | ICD-10-CM | POA: Insufficient documentation

## 2022-01-28 DIAGNOSIS — C61 Malignant neoplasm of prostate: Secondary | ICD-10-CM

## 2022-01-28 DIAGNOSIS — I480 Paroxysmal atrial fibrillation: Secondary | ICD-10-CM | POA: Insufficient documentation

## 2022-01-28 DIAGNOSIS — Z7901 Long term (current) use of anticoagulants: Secondary | ICD-10-CM | POA: Diagnosis not present

## 2022-01-28 DIAGNOSIS — I2510 Atherosclerotic heart disease of native coronary artery without angina pectoris: Secondary | ICD-10-CM | POA: Insufficient documentation

## 2022-01-28 NOTE — Progress Notes (Signed)
Introduced myself to the patient as the prostate nurse navigator.  No barriers to care identified at this time.  He is here to discuss his radiation treatment options, and has voiced he would like to proceed with ST ADT followed by 5.5 weeks of radiation.  I gave him my business card and asked him to call me with questions or concerns.  Verbalized understanding.

## 2022-01-30 NOTE — Progress Notes (Addendum)
RN contacted [facility] to set up appointment for start of ADT with Dr. Junious Silk.   Patient appointment for 02/10/2022 @ 12:45pm.   RN contacted patient, voicemail has not been set up at this time.  Will continue to attempt to get in contact.   Pt returned call and is aware of appointment time and date.

## 2022-02-11 NOTE — Progress Notes (Signed)
Pt presented with \stage T1c adenocarcinoma of the prostate with Gleason Score of 4+3, and PSA of 6.42.   Patient has started ADT, Orgovyx, on 02/10/2022 and will be followed by daily radiation treatments.   Currently awaiting for fiducial's, spaceOAR, and CT Simulation to be scheduled closer to late October.  Pt is aware of plan, will continue to follow to ensure navigation needs are met.

## 2022-02-12 ENCOUNTER — Telehealth: Payer: Self-pay | Admitting: *Deleted

## 2022-02-12 ENCOUNTER — Other Ambulatory Visit: Payer: Self-pay | Admitting: Urology

## 2022-02-12 ENCOUNTER — Telehealth: Payer: Self-pay | Admitting: Cardiology

## 2022-02-12 NOTE — Telephone Encounter (Signed)
Called patient to inform of fid. marker and space oar placement on 04-04-22 and his sim on 04-10-22 - arrival time- 10:45 am @ [facility], informed patient to arrive with a full bladder, spoke with patient and he verified understanding these appts. and the instructions

## 2022-02-12 NOTE — Telephone Encounter (Signed)
   Pre-operative Risk Assessment    Patient Name: John Love  DOB: 1950-07-07 MRN: [id number]      Request for Surgical Clearance    Procedure:   Fiducal Markers Space Oar Gel Placement   Date of Surgery:  Clearance 04/04/22                                 Surgeon:  Dr. Lutricia Feil Group or Practice Name:  [facility]  Phone number:  [phone] ext [phone] Fax number:  [phone]   Type of Clearance Requested:   - Pharmacy:  Hold Apixaban (Eliquis) 2 day hold    Type of Anesthesia:  MAC   Additional requests/questions:    Signed, April Henson   02/12/2022, 10:59 AM

## 2022-02-13 ENCOUNTER — Other Ambulatory Visit: Payer: Self-pay | Admitting: Cardiology

## 2022-02-13 NOTE — Telephone Encounter (Signed)
Prescription refill request for Eliquis received. Indication: AF Last office visit: 11/22/21  Roni Bread MD Scr: 0.95 on 11/26/21 Age: 71 Weight: 111.5kg  Based on above findings Eliquis 5mg  twice  daily is the appropriate dose.  Refill approved.

## 2022-02-18 NOTE — Telephone Encounter (Signed)
Clinical pharmacist to review Eliquis.  Patient was previously cleared for prostate biopsy in June.

## 2022-02-22 NOTE — Telephone Encounter (Signed)
Patient with diagnosis of atrial fibrillation on Eliquis for anticoagulation.    Procedure: fiducal markers space oar gel placement Date of procedure: 04/04/22   CHA2DS2-VASc Score = 3   This indicates a 3.2% annual risk of stroke. The patient's score is based upon: CHF History: 0 HTN History: 1 Diabetes History: 0 Stroke History: 0 Vascular Disease History: 1 Age Score: 1 Gender Score: 0    CrCl 113 Platelet count 223  Per office protocol, patient can hold Eliquis for 2 days prior to procedure.   Patient will/will not need bridging with Lovenox (enoxaparin) around procedure.  **This guidance is not considered finalized until pre-operative APP has relayed final recommendations.**

## 2022-02-24 NOTE — Telephone Encounter (Signed)
   Name: John Love  DOB: 07/03/1950  MRN: [id number]   Primary Cardiologist: [name], MD  Chart reviewed as part of pre-operative protocol coverage.  Therefore, based on ACC/AHA guidelines, the patient would be at acceptable risk for the planned procedure without further cardiovascular testing.   Per office protocol, patient can hold Eliquis for 2 days prior to procedure.   Patient will/will not need bridging with Lovenox (enoxaparin) around procedure.  I will route this recommendation to the requesting party via Epic fax function and remove from pre-op pool. Please call with questions.  Elgie Collard, PA-C 02/24/2022, 8:19 AM

## 2022-03-28 NOTE — Progress Notes (Signed)
Spoke w/ via phone for pre-op interview--- pt Lab needs dos----   [name] results------ current ekg in epic/ chart;  current chest ct in care everywhere COVID test -----patient states asymptomatic no test needed Arrive at -------  0600 on 04-04-2022 NPO after MN NO Solid Food.  Clear liquids from MN until--- 0500 Med rec completed Medications to take morning of surgery ----- lopressor, flomax Diabetic medication ----- n/a Patient instructed no nail polish to be worn day of surgery Patient instructed to bring photo id and insurance card day of surgery Patient aware to have Driver (ride ) / caregiver  for 24 hours after surgery -- wife, John Love Patient Special Instructions ----- will do one fleet enema night before surgery Pre-Op special Istructions ----- pt has telephone cardiac clearance by Nicholes Rough PA on 02-24-2022 in epic/ chart Pt denies cardiac s&s, sob, and stated a little lower extremity edema Patient verbalized understanding of instructions that were given at this phone interview. Patient denies shortness of breath, chest pain, fever, cough at this phone interview.    Anesthesia : on 03-28-2022 Reviewed pt chart for pre-op interview note his extensive cardiac history and does have cardiac clearance for surgery on 04-04-2022 @[facility]  by Dr Cain Sieve.  Chart reviewed w/ anesthesia, Dr Jenita Seashore MDA.  Dr Glennon Mac stated ok to proceed barring any status change.  Cardiologist :  Dr Ellyn Hack [facility] 11-22-2021 epic) Chest x-ray : chest CT 12-10-2021 care [facility] EKG :  11-22-2021 epic Echo : 02-23-2020 care [facility] Stress test: no Cardiac Cath :  08-10-2019 epic Activity level:  denies sob w/ any activity Sleep Study/ CPAP : NO  Blood Thinner/ Instructions Maryjane Hurter Dose: Eliquis ASA / Instructions/ Last Dose :  no Pt was given instructions by cardiology to stop 2 days prior to surgery, last dose will be 04-01-2022

## 2022-03-31 ENCOUNTER — Encounter (HOSPITAL_BASED_OUTPATIENT_CLINIC_OR_DEPARTMENT_OTHER): Payer: Self-pay | Admitting: Urology

## 2022-04-04 ENCOUNTER — Encounter (HOSPITAL_BASED_OUTPATIENT_CLINIC_OR_DEPARTMENT_OTHER)
Admission: RE | Disposition: A | Discharge status: Home / Self Care | Payer: Self-pay | Source: Ambulatory Visit | Attending: Urology

## 2022-04-04 ENCOUNTER — Ambulatory Visit (HOSPITAL_BASED_OUTPATIENT_CLINIC_OR_DEPARTMENT_OTHER)
Admission: RE | Admit: 2022-04-04 | Discharge: 2022-04-04 | Disposition: A | Discharge status: Home / Self Care | Payer: Medicare Other | Source: Ambulatory Visit | Attending: Urology | Admitting: Urology

## 2022-04-04 ENCOUNTER — Encounter (HOSPITAL_BASED_OUTPATIENT_CLINIC_OR_DEPARTMENT_OTHER): Payer: Self-pay | Admitting: Urology

## 2022-04-04 ENCOUNTER — Ambulatory Visit (HOSPITAL_BASED_OUTPATIENT_CLINIC_OR_DEPARTMENT_OTHER): Payer: Medicare Other | Admitting: Anesthesiology

## 2022-04-04 ENCOUNTER — Other Ambulatory Visit: Payer: Self-pay

## 2022-04-04 DIAGNOSIS — I10 Essential (primary) hypertension: Secondary | ICD-10-CM

## 2022-04-04 DIAGNOSIS — Z955 Presence of coronary angioplasty implant and graft: Secondary | ICD-10-CM | POA: Diagnosis not present

## 2022-04-04 DIAGNOSIS — Z01818 Encounter for other preprocedural examination: Secondary | ICD-10-CM

## 2022-04-04 DIAGNOSIS — I2510 Atherosclerotic heart disease of native coronary artery without angina pectoris: Secondary | ICD-10-CM | POA: Diagnosis not present

## 2022-04-04 DIAGNOSIS — C61 Malignant neoplasm of prostate: Secondary | ICD-10-CM | POA: Insufficient documentation

## 2022-04-04 DIAGNOSIS — F17210 Nicotine dependence, cigarettes, uncomplicated: Secondary | ICD-10-CM

## 2022-04-04 DIAGNOSIS — Z85118 Personal history of other malignant neoplasm of bronchus and lung: Secondary | ICD-10-CM | POA: Diagnosis not present

## 2022-04-04 HISTORY — PX: GOLD SEED IMPLANT: SHX6343

## 2022-04-04 HISTORY — DX: Complete loss of teeth, unspecified cause, unspecified class: K08.109

## 2022-04-04 HISTORY — DX: Male erectile dysfunction, unspecified: N52.9

## 2022-04-04 HISTORY — DX: Long term (current) use of anticoagulants: Z79.01

## 2022-04-04 HISTORY — PX: SPACE OAR INSTILLATION: SHX6769

## 2022-04-04 HISTORY — DX: Prediabetes: R73.03

## 2022-04-04 HISTORY — DX: Benign prostatic hyperplasia with lower urinary tract symptoms: N40.1

## 2022-04-04 HISTORY — DX: Personal history of diseases of the blood and blood-forming organs and certain disorders involving the immune mechanism: Z86.2

## 2022-04-04 HISTORY — DX: Personal history of antineoplastic chemotherapy: Z92.21

## 2022-04-04 HISTORY — DX: Personal history of irradiation: Z92.3

## 2022-04-04 LAB — POCT I-STAT, CHEM 8
BUN: 15 mg/dL (ref 8–23)
Calcium, Ion: 1.28 mmol/L (ref 1.15–1.40)
Chloride: 104 mmol/L (ref 98–111)
Creatinine, Ser: 0.9 mg/dL (ref 0.61–1.24)
Glucose, Bld: 101 mg/dL — ABNORMAL HIGH (ref 70–99)
HCT: 44 % (ref 39.0–52.0)
Hemoglobin: 15 g/dL (ref 13.0–17.0)
Potassium: 4.3 mmol/L (ref 3.5–5.1)
Sodium: 142 mmol/L (ref 135–145)
TCO2: 27 mmol/L (ref 22–32)

## 2022-04-04 SURGERY — INSERTION, GOLD SEEDS
Anesthesia: Monitor Anesthesia Care | Site: Prostate

## 2022-04-04 MED ORDER — KETAMINE HCL 10 MG/ML IJ SOLN
INTRAMUSCULAR | Status: DC | PRN
  Administered 2022-04-04: 50 mg via INTRAVENOUS

## 2022-04-04 MED ORDER — FENTANYL CITRATE (PF) 100 MCG/2ML IJ SOLN: 25.0000 ug | INTRAMUSCULAR | Status: DC | PRN

## 2022-04-04 MED ORDER — CEFAZOLIN SODIUM-DEXTROSE 2-4 GM/100ML-% IV SOLN
2.0000 g | INTRAVENOUS | Status: AC
  Administered 2022-04-04: 2 g via INTRAVENOUS

## 2022-04-04 MED ORDER — PROPOFOL 10 MG/ML IV BOLUS
INTRAVENOUS | Status: AC
  Filled 2022-04-04: qty 20

## 2022-04-04 MED ORDER — LACTATED RINGERS IV SOLN: INTRAVENOUS | Status: DC

## 2022-04-04 MED ORDER — ONDANSETRON HCL 4 MG/2ML IJ SOLN: 4.0000 mg | Freq: Once | INTRAMUSCULAR | Status: DC | PRN

## 2022-04-04 MED ORDER — PROPOFOL 500 MG/50ML IV EMUL
INTRAVENOUS | Status: DC | PRN
  Administered 2022-04-04: 200 ug/kg/min via INTRAVENOUS

## 2022-04-04 MED ORDER — SODIUM CHLORIDE (PF) 0.9 % IJ SOLN
INTRAMUSCULAR | Status: DC | PRN
  Administered 2022-04-04: 10 mL

## 2022-04-04 MED ORDER — KETAMINE HCL 50 MG/5ML IJ SOSY
PREFILLED_SYRINGE | INTRAMUSCULAR | Status: AC
  Filled 2022-04-04: qty 5

## 2022-04-04 MED ORDER — FLEET ENEMA 7-19 GM/118ML RE ENEM: 1.0000 | ENEMA | Freq: Once | RECTAL | Status: DC

## 2022-04-04 MED ORDER — PROPOFOL 10 MG/ML IV BOLUS
INTRAVENOUS | Status: DC | PRN
  Administered 2022-04-04: 30 mg via INTRAVENOUS

## 2022-04-04 MED ORDER — LIDOCAINE 2% (20 MG/ML) 5 ML SYRINGE
INTRAMUSCULAR | Status: DC | PRN
  Administered 2022-04-04: 40 mg via INTRAVENOUS

## 2022-04-04 MED ORDER — CEFAZOLIN SODIUM-DEXTROSE 2-4 GM/100ML-% IV SOLN
INTRAVENOUS | Status: AC
  Filled 2022-04-04: qty 100

## 2022-04-04 SURGICAL SUPPLY — 23 items
BLADE CLIPPER SENSICLIP SURGIC (BLADE) ×1 IMPLANT
CNTNR URN SCR LID CUP LEK RST (MISCELLANEOUS) ×1 IMPLANT
CONT SPEC 4OZ STRL OR WHT (MISCELLANEOUS) ×1
COVER BACK TABLE 60X90IN (DRAPES) ×1 IMPLANT
DRSG TEGADERM 4X4.75 (GAUZE/BANDAGES/DRESSINGS) ×1 IMPLANT
DRSG TEGADERM 8X12 (GAUZE/BANDAGES/DRESSINGS) ×1 IMPLANT
GAUZE SPONGE 4X4 12PLY STRL (GAUZE/BANDAGES/DRESSINGS) ×1 IMPLANT
GAUZE SPONGE 4X4 12PLY STRL LF (GAUZE/BANDAGES/DRESSINGS) IMPLANT
GLOVE BIO SURGEON STRL SZ7.5 (GLOVE) ×1 IMPLANT
GLOVE BIOGEL PI IND STRL 7.0 (GLOVE) IMPLANT
IMPL SPACEOAR VUE SYSTEM (Spacer) IMPLANT
IMPLANT SPACEOAR VUE SYSTEM (Spacer) ×1 IMPLANT
KIT TURNOVER CYSTO (KITS) ×1 IMPLANT
MARKER GOLD PRELOAD 1.2X3 (Urological Implant) ×1 IMPLANT
MARKER SKIN DUAL TIP RULER LAB (MISCELLANEOUS) ×1 IMPLANT
NDL SPNL 22GX3.5 QUINCKE BK (NEEDLE) IMPLANT
NEEDLE SPNL 22GX3.5 QUINCKE BK (NEEDLE) ×1 IMPLANT
SEED GOLD PRELOAD 1.2X3 (Urological Implant) ×1 IMPLANT
SHEATH ULTRASOUND LTX NONSTRL (SHEATH) IMPLANT
SYR 10ML LL (SYRINGE) IMPLANT
SYR CONTROL 10ML LL (SYRINGE) ×1 IMPLANT
TOWEL OR 17X26 10 PK STRL BLUE (TOWEL DISPOSABLE) ×1 IMPLANT
UNDERPAD 30X36 HEAVY ABSORB (UNDERPADS AND DIAPERS) ×1 IMPLANT

## 2022-04-04 NOTE — Anesthesia Postprocedure Evaluation (Signed)
Anesthesia Post Note  Patient: John Love  Procedure(s) Performed: GOLD SEED IMPLANT (Prostate) SPACE OAR INSTILLATION (Prostate)     Patient location during evaluation: PACU Anesthesia Type: MAC Level of consciousness: awake and alert Pain management: pain level controlled Vital Signs Assessment: post-procedure vital signs reviewed and stable Respiratory status: spontaneous breathing, nonlabored ventilation, respiratory function stable and patient connected to nasal cannula oxygen Cardiovascular status: stable and blood pressure returned to baseline Postop Assessment: no apparent nausea or vomiting Anesthetic complications: no   No notable events documented.  Last Vitals:  Vitals:   04/04/22 0930 04/04/22 0945  BP: 118/79 (!) 141/87  Pulse: 82 78  Resp: (!) 24 17  Temp:    SpO2: 100% 95%    Last Pain:  Vitals:   04/04/22 0927  TempSrc:   PainSc: 0-No pain                 Klever Twyford S

## 2022-04-04 NOTE — H&P (Signed)
H&P  History of Present Illness: John Love is a 71 y.o. year old with prostate cancer who presents today for placement of gold seeds and spaceOAR  Past Medical History:  Diagnosis Date   Anticoagulated    eliquis --- managed by cardiology   Benign localized prostatic hyperplasia with lower urinary tract symptoms (LUTS)    CAD S/P 4V PCI 08/10/2019   08/09/19: m-dLCx 80%--OM2 100% @ small branch take-off. --> DES PCI: 2.25 mm x 28 mm Synergy DES (2.75 mm);  pRCA 50%-80% tandem lesions --> DES PCI: 3.5 mm x 24 mm Synergy DES (4.1 mm) 08/29/19: Staged Bifurcation PCI of p-dLAD@D1  with CSI atherectomy and scoring low PTA of ostial D1-  (4 total stents, initial stent from LAD into D1 (Synergy 2.75 x 16-2.9 mm, with 3 overlapping stents in L   ED (erectile dysfunction)    Full dentures    H/O: upper GI bleed 05/01/2020   Admitted with acute blood loss anemia-4+ units PRBC.  Provocative bleeding scan-small bowel AVMs s/p Cauterization 05/08/2020   History of anemia    History of cancer chemotherapy    06-11-2020  to 06-07-2021 lung cancer   History of external beam radiation therapy    03-21-2020  to  05-11-2020  RLL   History of ST elevation myocardial infarction (STEMI) 08/09/2019   History of sudden cardiac arrest successfully resuscitated 08/09/2019   15 minutes following second COVID-19 vaccination -> vasovagal episode during the hypotension followed by VF cardiac arrest >20 min@ [place] [facility]; found to have 4 vessel CAD on cath with culprit 100% OM 3--now status post four-vessel PCI   History of sustained ventricular fibrillation 08/09/2019   In setting of acute MI   Malignant neoplasm prostate [facility]) 12/2021   urologist--- dr eskridge/   radiation onologist--- dr Tammi Klippel;   dx 07/ 2023   Multiple vessel coronary artery disease 08/10/2019   Heart Cath -PCI 08/09/2019:  CULPRIT LESION: Mid Cx to Dist Cx lesion is 80% stenosed. 3rd Mrg lesion is 100% stenosed at small branch take-off.  DES PCI: 2.25 mm x 28 mm Synergy DES (proximal postdilated 2.75 mm.  Post intervention, there is a 0% residual stenosis.  Small side branch was occluded Ost RCA to Prox RCA lesion is 50% stenosed.Prox RCA lesion is 80% stenosed.  DES PCI: 3.5 mm x 24 mm Sy   Paroxysmal atrial fibrillation ([place]) 04/06/2020   cardiologist---- dr Ellyn Hack Presented to [facility] -in A. fib RVR  04-07-2020  s/p DCCV   Pre-diabetes    S/P drug eluting coronary stent placement 08/09/2019   total 6 stents involving 4V   Squamous cell carcinoma of bronchus in right lower lobe ([place]) 01/2020   followed by [facility] in [facility]  (Dr Harlow Asa) CT scan 01/28/2020-status post bronchoscopy with biopsy -> pathology T3, N2, M0 poorly differentiated; MRI Brain - NO intracranial Mets.;   completed chemo 06-07-2021 and radiation 05-11-2010    Past Surgical History:  Procedure Laterality Date   COLONOSCOPY WITH ESOPHAGOGASTRODUODENOSCOPY (EGD)  05/02/2020   [facility]: No evidence of acute bleeding, colon polyps noted.   CORONARY ATHERECTOMY N/A 08/29/2019   Procedure: CORONARY ATHERECTOMY;  Surgeon: Leonie Man, MD;  Location: [place] [facility];  Service: Cardiovascular; SCORING BALLOON ANGIOPLASTY- ostial D1, CSI ATHERECTOMY p-mLAD -> followed by bifurcation stenting   CORONARY STENT INTERVENTION N/A 08/09/2019   Procedure: CORONARY STENT INTERVENTION;  Surgeon: Lorretta Harp, MD::  2V PCI: Culprit lesion ~100% OM3 (with 80% main  LCx) --> DES PCI covering both lesions-Synergy DES 2.25 mm x 28 mm, postdilated to 2.75 mm; LESION #2 proximal RCA 50% followed by 80% --> DES PCI covering both-Synergy DES 3.5 mm x 24 mm   CORONARY STENT INTERVENTION N/A 08/29/2019   Procedure: CORONARY STENT INTERVENTION;  Surgeon: Leonie Man, MD:: Bifurcation DES PCI, Reverse Culotte Technique W/ KB (4 total SYNERGY DES stents, 1st (2.75X16 - 2.9 MM) - pLAD-ostD1 & 3 overlapping in LAD (3.0 x 38, 3.0 x 16, 2.75 x16 - tapered POT  postdilation 3.6-3.1 & apical LAD PTA only.   CYSTOSCOPY N/A 08/18/2019   Procedure: CYSTOSCOPY CLOT EVACUATION FULGURATION 0.5-2 CM.;  Surgeon: Festus Aloe, MD;  Location: [place];  Service: Urology;  Laterality: N/A;   ESOPHAGOGASTRODUODENOSCOPY W/ BANDING  05/08/2020   [facility]: identified Small Bowel AVMs - > Rx with Cauterization   LEFT HEART CATH AND CORONARY ANGIOGRAPHY N/A 08/09/2019   Procedure: RIGHT & LEFT HEART CATH AND CORONARY ANGIOGRAPHY;  Surgeon: Lorretta Harp, MD;  Location: [place] [facility]:: post-arrest : 80% LCx-100% OM3 (DES PCI), pRCA tandem 50 & 80% (DES PCI), Bifurcation LAD (75% calcified)-ostial D1 95%.  LVEDP 11 mmHg, PCWP 15 mmHg.    MESENTERIC ARTERIOGRAM  05/07/2020   [facility]: No evidence of bleed or target ASM for treatment (Presented with ABLA - UGIB)    TRANSTHORACIC ECHOCARDIOGRAM  08/10/2019    Post cardiac arrest: EF 55 to 60%.  No R WMA.  GR 1 DD.  Normal RV.  Normal valves.   TRANSTHORACIC ECHOCARDIOGRAM  02/23/2020   normal LV systolic function with an EF of 55-60%. The left atrium was mildly dilated.   Trivial pericardial effusion.   VIDEO BRONCHOSCOPY WITH RADIAL ENDOBRONCHIAL ULTRASOUND  02/09/2020   [facility]: Dr. Hilton Cork => for RLL Lung mass: Prominent adenopathy in the paratracheal (2R-4R) station, subcarinal station(7), and 11 L.  Complex adenopathy/Mass in R [place] (unable to determine nodes versus direct tumor invasion). EBUS-transBronch Needle Asp @ 11L, 7 & 2R LNs-> No evidence of Malignancy; TO of Bronchus Intermedius w/ both endobronchial tumor & extrinsic compression. Bx taken.    Home Medications:  Current Meds  Medication Sig   ascorbic acid (VITAMIN C) 500 MG tablet 1 tab(s)   atorvastatin (LIPITOR) 40 MG tablet TAKE 1 TABLET(40 MG) BY MOUTH DAILY (Patient taking differently: Take 40 mg by mouth at bedtime.)   Bicalutamide (CASODEX PO) Take 1 tablet by mouth daily. "Testosterone blocker from urology office" pt unsure of exact name    ELIQUIS 5 MG TABS tablet TAKE 1 TABLET(5 MG) BY MOUTH TWICE DAILY (Patient taking differently: Take 5 mg by mouth 2 (two) times daily.)   metoprolol tartrate (LOPRESSOR) 50 MG tablet TAKE 1 TABLET(50 MG) BY MOUTH TWICE DAILY (Patient taking differently: Take 50 mg by mouth 2 (two) times daily.)   Multiple Vitamin (MULTIVITAMIN ADULT PO) Take 2 tablets by mouth daily.   tamsulosin (FLOMAX) 0.4 MG CAPS capsule Take 1 capsule (0.4 mg total) by mouth daily after supper. (Patient taking differently: Take 0.4 mg by mouth 2 (two) times daily.)    Allergies: No Known Allergies  Family History  Problem Relation Age of Onset   Heart disease Father        died in his 56's during circumscion procedure    Social History:  reports that he has been smoking cigarettes. He has a 36.75 pack-year smoking history. He has never used smokeless tobacco. He reports current alcohol use of about 28.0 - 35.0  standard drinks of alcohol per week. He reports that he does not use drugs.  ROS: A complete review of systems was performed.  All systems are negative except for pertinent findings as noted.  Physical Exam:  Vital signs in last 24 hours: Temp:  [97.6 F (36.4 C)] 97.6 F (36.4 C) (10/20 0634) Pulse Rate:  [80] 80 (10/20 0634) Resp:  [18] 18 (10/20 0634) BP: (145)/(85) 145/85 (10/20 0634) SpO2:  [96 %] 96 % (10/20 0634) Weight:  [115.3 kg] 115.3 kg (10/20 2924) Constitutional:  Alert and oriented, No acute distress Cardiovascular: Regular rate and rhythm, No JVD Respiratory: Normal respiratory effort, Lungs clear bilaterally GI: Abdomen is soft, nontender, nondistended, no abdominal masses GU: No CVA tenderness Lymphatic: No lymphadenopathy Neurologic: Grossly intact, no focal deficits Psychiatric: Normal mood and affect Drains/Tubes: none   Laboratory Data:  Recent Labs    04/04/22 0655  HGB 15.0  HCT 44.0    Recent Labs    04/04/22 0655  NA 142  K 4.3  CL 104  GLUCOSE 101*  BUN  15  CREATININE 0.90     Results for orders placed or performed during the hospital encounter of 04/04/22 (from the past 24 hour(s))  I-STAT, chem 8     Status: Abnormal   Collection Time: 04/04/22  6:55 AM  Result Value Ref Range   Sodium 142 135 - 145 mmol/L   Potassium 4.3 3.5 - 5.1 mmol/L   Chloride 104 98 - 111 mmol/L   BUN 15 8 - 23 mg/dL   Creatinine, Ser 0.90 0.61 - 1.24 mg/dL   Glucose, Bld 101 (H) 70 - 99 mg/dL   Calcium, Ion 1.28 1.15 - 1.40 mmol/L   TCO2 27 22 - 32 mmol/L   Hemoglobin 15.0 13.0 - 17.0 g/dL   HCT 44.0 39.0 - 52.0 %   No results found for this or any previous visit (from the past 240 hour(s)).  Renal Function: Recent Labs    04/04/22 0655  CREATININE 0.90   Estimated Creatinine Clearance: 104.6 mL/min (by C-G formula based on SCr of 0.9 mg/dL).  Radiologic Imaging: No results found.  Assessment:  71 yo M with hx of prostate cancer  Plan:  To OR as planned for gold seeds/spaceOAR placement. Procedure and risks reviewed, and all questions answered.  Donald Pore, MD 04/04/2022, 7:23 AM  [facility] Pager: [phone]

## 2022-04-04 NOTE — Transfer of Care (Signed)
Immediate Anesthesia Transfer of Care Note  Patient: John Love  Procedure(s) Performed: GOLD SEED IMPLANT (Prostate) SPACE OAR INSTILLATION (Prostate)  Patient Location: PACU  Anesthesia Type:MAC  Level of Consciousness: awake, patient cooperative and responds to stimulation  Airway & Oxygen Therapy: Patient Spontanous Breathing and Patient connected to face mask oxygen  Post-op Assessment: Report given to RN and Post -op Vital signs reviewed and stable  Post vital signs: Reviewed and stable  Last Vitals:  Vitals Value Taken Time  BP 131/76 04/04/22 0928  Temp    Pulse 82 04/04/22 0929  Resp 24 04/04/22 0929  SpO2 100 % 04/04/22 0929  Vitals shown include unvalidated device data.  Last Pain:  Vitals:   04/04/22 0634  TempSrc: Oral  PainSc: 0-No pain      Patients Stated Pain Goal: 6 (70/26/37 8588)  Complications: No notable events documented.

## 2022-04-04 NOTE — Anesthesia Preprocedure Evaluation (Signed)
Anesthesia Evaluation  Patient identified by MRN, date of birth, ID band Patient awake    Reviewed: Allergy & Precautions, NPO status , Patient's Chart, lab work & pertinent test results  Airway Mallampati: III  TM Distance: <3 FB Neck ROM: Full    Dental no notable dental hx.    Pulmonary Current SmokerPatient did not abstain from smoking.,  Lung cancer   breath sounds clear to auscultation + decreased breath sounds      Cardiovascular hypertension, + CAD and + Cardiac Stents  Normal cardiovascular exam Rhythm:Regular Rate:Normal     Neuro/Psych negative neurological ROS  negative psych ROS   GI/Hepatic negative GI ROS, Neg liver ROS,   Endo/Other  negative endocrine ROS  Renal/GU negative Renal ROS  negative genitourinary   Musculoskeletal negative musculoskeletal ROS (+)   Abdominal   Peds negative pediatric ROS (+)  Hematology negative hematology ROS (+)   Anesthesia Other Findings   Reproductive/Obstetrics negative OB ROS                             Anesthesia Physical Anesthesia Plan  ASA: 3  Anesthesia Plan: MAC   Post-op Pain Management: Minimal or no pain anticipated   Induction: Intravenous  PONV Risk Score and Plan: 1 and Propofol infusion and Treatment may vary due to age or medical condition  Airway Management Planned: Simple Face Mask  Additional Equipment:   Intra-op Plan:   Post-operative Plan:   Informed Consent: I have reviewed the patients History and Physical, chart, labs and discussed the procedure including the risks, benefits and alternatives for the proposed anesthesia with the patient or authorized representative who has indicated his/her understanding and acceptance.     Dental advisory given  Plan Discussed with: CRNA and Surgeon  Anesthesia Plan Comments:         Anesthesia Quick Evaluation

## 2022-04-04 NOTE — Op Note (Signed)
Preoperative diagnosis: Clinically localized adenocarcinoma of the prostate   Postoperative diagnosis: Clinically localized adenocarcinoma of the prostate  Procedure: 1) Placement of fiducial markers into prostate                    2) Insertion of SpaceOAR hydrogel   Surgeon:Luke Kaelynn Igo. M.D.  Anesthesia: General  EBL: Minimal  Complications: None  Indication: John Love is a 71 y.o. gentleman with clinically localized prostate cancer. After discussing management options for treatment, he elected to proceed with radiotherapy. He presents today for the above procedures. The potential risks, complications, alternative options, and expected recovery course have been discussed in detail with the patient and he has provided informed consent to proceed.  Description of procedure: The patient was administered preoperative antibiotics, placed in the dorsal lithotomy position, and prepped and draped in the usual sterile fashion. Next, transrectal ultrasonography was utilized to visualize the prostate. Three gold fiducial markers were then placed into the prostate via transperineal needles under ultrasound guidance at the left apex, left base, and right mid gland under direct ultrasound guidance. A site in the midline was then selected on the perineum for placement of an 18 g needle with saline. The needle was advanced above the rectum and below Denonvillier's fascia to the mid gland and confirmed to be in the midline on transverse imaging. One cc of saline was injected confirming appropriate expansion of this space. A total of 5 cc of saline was then injected to open the space further bilaterally. The saline syringe was then removed and the SpaceOAR hydrogel was injected with good distribution bilaterally. He tolerated the procedure well and without complications. He was given a voiding trial prior to discharge from the PACU.

## 2022-04-04 NOTE — Discharge Instructions (Addendum)
You should avoid strenuous activities today but may resume all normal activities tomorrow.  2.   You can take Tylenol as needed for any pain or discomfort.  3.    Follow up with your radiation oncologist for your simulation appointment as scheduled.  If this is not currently scheduled or you do not know the date/time for that appointment, please contact the radiation oncology office to confirm.    Post Anesthesia Home Care Instructions  Activity: Get plenty of rest for the remainder of the day. A responsible individual must stay with you for 24 hours following the procedure.  For the next 24 hours, DO NOT: -Drive a car -Operate machinery -Drink alcoholic beverages -Take any medication unless instructed by your physician -Make any legal decisions or sign important papers.  Meals: Start with liquid foods such as gelatin or soup. Progress to regular foods as tolerated. Avoid greasy, spicy, heavy foods. If nausea and/or vomiting occur, drink only clear liquids until the nausea and/or vomiting subsides. Call your physician if vomiting continues.  Special Instructions/Symptoms: Your throat may feel dry or sore from the anesthesia or the breathing tube placed in your throat during surgery. If this causes discomfort, gargle with warm salt water. The discomfort should disappear within 24 hours.  

## 2022-04-04 NOTE — Anesthesia Procedure Notes (Signed)
Procedure Name: MAC Date/Time: 04/04/2022 8:48 AM  Performed by: Suan Halter, CRNAPre-anesthesia Checklist: Patient identified, Emergency Drugs available, Suction available, Patient being monitored and Timeout performed Oxygen Delivery Method: Simple face mask

## 2022-04-07 ENCOUNTER — Encounter (HOSPITAL_BASED_OUTPATIENT_CLINIC_OR_DEPARTMENT_OTHER): Payer: Self-pay | Admitting: Urology

## 2022-04-08 ENCOUNTER — Telehealth: Payer: Self-pay | Admitting: *Deleted

## 2022-04-08 NOTE — Telephone Encounter (Signed)
Called patient's wife to remind of sim appt. for 04-10-22- arrival time- 10:45 am @ [facility], informed patient to arrive with a full bladder, lvm for a return call

## 2022-04-09 ENCOUNTER — Telehealth: Payer: Self-pay | Admitting: *Deleted

## 2022-04-09 NOTE — Telephone Encounter (Signed)
Called patient to remind of sim tomorrow (04/10/22)- arrival time- 10:45 am @ [facility], informed patient to arrive with a full bladder, spoke with patient and he is aware of this appt. and the instructions

## 2022-04-09 NOTE — Progress Notes (Signed)
  Radiation Oncology         [phone] ________________________________  Name: John Love MRN: [id number]  Date: 04/10/2022  DOB: April 08, 1951  SIMULATION AND TREATMENT PLANNING NOTE    ICD-10-CM   1. Malignant neoplasm of prostate ([place])  C61       DIAGNOSIS:  71 y.o. gentleman with Stage T1c adenocarcinoma of the prostate with Gleason score of 4+3, and PSA of 6.42.   NARRATIVE:  The patient was brought to the [place].  Identity was confirmed.  All relevant records and images related to the planned course of therapy were reviewed.  The patient freely provided informed written consent to proceed with treatment after reviewing the details related to the planned course of therapy. The consent form was witnessed and verified by the simulation staff.  Then, the patient was set-up in a stable reproducible supine position for radiation therapy.  A vacuum lock pillow device was custom fabricated to position his legs in a reproducible immobilized position.  Then, I performed a urethrogram under sterile conditions to identify the prostatic apex.  CT images were obtained.  Surface markings were placed.  The CT images were loaded into the planning software.  Then the prostate target and avoidance structures including the rectum, bladder, bowel and hips were contoured.  Treatment planning then occurred.  The radiation prescription was entered and confirmed.  A total of one complex treatment devices was fabricated. I have requested : Intensity Modulated Radiotherapy (IMRT) is medically necessary for this case for the following reason:  Rectal sparing.[name]  PLAN:  The patient will receive 70 Gy in 28 fractions, concurrent with ST-ADT.  ________________________________  Sheral Apley. Tammi Klippel, M.D.

## 2022-04-10 ENCOUNTER — Other Ambulatory Visit: Payer: Self-pay

## 2022-04-10 ENCOUNTER — Ambulatory Visit
Admission: RE | Admit: 2022-04-10 | Discharge: 2022-04-10 | Disposition: A | Discharge status: Home / Self Care | Payer: Medicare Other | Source: Ambulatory Visit | Attending: Radiation Oncology | Admitting: Radiation Oncology

## 2022-04-10 DIAGNOSIS — C61 Malignant neoplasm of prostate: Secondary | ICD-10-CM | POA: Insufficient documentation

## 2022-04-17 DIAGNOSIS — C61 Malignant neoplasm of prostate: Secondary | ICD-10-CM | POA: Insufficient documentation

## 2022-04-21 ENCOUNTER — Other Ambulatory Visit: Payer: Self-pay

## 2022-04-21 ENCOUNTER — Ambulatory Visit
Admission: RE | Admit: 2022-04-21 | Discharge: 2022-04-21 | Disposition: A | Discharge status: Home / Self Care | Payer: Medicare Other | Source: Ambulatory Visit | Attending: Radiation Oncology | Admitting: Radiation Oncology

## 2022-04-21 DIAGNOSIS — C61 Malignant neoplasm of prostate: Secondary | ICD-10-CM | POA: Diagnosis not present

## 2022-04-21 LAB — RAD ONC ARIA SESSION SUMMARY
Course Elapsed Days: 0
Plan Fractions Treated to Date: 1
Plan Prescribed Dose Per Fraction: 2.5 Gy
Plan Total Fractions Prescribed: 28
Plan Total Prescribed Dose: 70 Gy
Reference Point Dosage Given to Date: 2.5 Gy
Reference Point Session Dosage Given: 2.5 Gy
Session Number: 1

## 2022-04-22 ENCOUNTER — Other Ambulatory Visit: Payer: Self-pay

## 2022-04-22 ENCOUNTER — Ambulatory Visit
Admission: RE | Admit: 2022-04-22 | Discharge: 2022-04-22 | Disposition: A | Discharge status: Home / Self Care | Payer: Medicare Other | Source: Ambulatory Visit | Attending: Radiation Oncology | Admitting: Radiation Oncology

## 2022-04-22 DIAGNOSIS — C61 Malignant neoplasm of prostate: Secondary | ICD-10-CM | POA: Diagnosis not present

## 2022-04-22 LAB — RAD ONC ARIA SESSION SUMMARY
Course Elapsed Days: 1
Plan Fractions Treated to Date: 2
Plan Prescribed Dose Per Fraction: 2.5 Gy
Plan Total Fractions Prescribed: 28
Plan Total Prescribed Dose: 70 Gy
Reference Point Dosage Given to Date: 5 Gy
Reference Point Session Dosage Given: 2.5 Gy
Session Number: 2

## 2022-04-23 ENCOUNTER — Ambulatory Visit
Admission: RE | Admit: 2022-04-23 | Discharge: 2022-04-23 | Disposition: A | Discharge status: Home / Self Care | Payer: Medicare Other | Source: Ambulatory Visit | Attending: Radiation Oncology | Admitting: Radiation Oncology

## 2022-04-23 ENCOUNTER — Other Ambulatory Visit: Payer: Self-pay

## 2022-04-23 DIAGNOSIS — C61 Malignant neoplasm of prostate: Secondary | ICD-10-CM | POA: Diagnosis not present

## 2022-04-23 LAB — RAD ONC ARIA SESSION SUMMARY
Course Elapsed Days: 2
Plan Fractions Treated to Date: 3
Plan Prescribed Dose Per Fraction: 2.5 Gy
Plan Total Fractions Prescribed: 28
Plan Total Prescribed Dose: 70 Gy
Reference Point Dosage Given to Date: 7.5 Gy
Reference Point Session Dosage Given: 2.5 Gy
Session Number: 3

## 2022-04-24 ENCOUNTER — Ambulatory Visit
Admission: RE | Admit: 2022-04-24 | Discharge: 2022-04-24 | Disposition: A | Discharge status: Home / Self Care | Payer: Medicare Other | Source: Ambulatory Visit | Attending: Radiation Oncology | Admitting: Radiation Oncology

## 2022-04-24 ENCOUNTER — Other Ambulatory Visit: Payer: Self-pay

## 2022-04-24 DIAGNOSIS — C61 Malignant neoplasm of prostate: Secondary | ICD-10-CM | POA: Diagnosis not present

## 2022-04-24 LAB — RAD ONC ARIA SESSION SUMMARY
Course Elapsed Days: 3
Plan Fractions Treated to Date: 4
Plan Prescribed Dose Per Fraction: 2.5 Gy
Plan Total Fractions Prescribed: 28
Plan Total Prescribed Dose: 70 Gy
Reference Point Dosage Given to Date: 10 Gy
Reference Point Session Dosage Given: 2.5 Gy
Session Number: 4

## 2022-04-25 ENCOUNTER — Ambulatory Visit
Admission: RE | Admit: 2022-04-25 | Discharge: 2022-04-25 | Disposition: A | Discharge status: Home / Self Care | Payer: Medicare Other | Source: Ambulatory Visit | Attending: Radiation Oncology | Admitting: Radiation Oncology

## 2022-04-25 ENCOUNTER — Other Ambulatory Visit: Payer: Self-pay

## 2022-04-25 DIAGNOSIS — C61 Malignant neoplasm of prostate: Secondary | ICD-10-CM | POA: Diagnosis not present

## 2022-04-25 LAB — RAD ONC ARIA SESSION SUMMARY
Course Elapsed Days: 4
Plan Fractions Treated to Date: 5
Plan Prescribed Dose Per Fraction: 2.5 Gy
Plan Total Fractions Prescribed: 28
Plan Total Prescribed Dose: 70 Gy
Reference Point Dosage Given to Date: 12.5 Gy
Reference Point Session Dosage Given: 2.5 Gy
Session Number: 5

## 2022-04-28 ENCOUNTER — Other Ambulatory Visit: Payer: Self-pay

## 2022-04-28 ENCOUNTER — Ambulatory Visit
Admission: RE | Admit: 2022-04-28 | Discharge: 2022-04-28 | Disposition: A | Discharge status: Home / Self Care | Payer: Medicare Other | Source: Ambulatory Visit | Attending: Radiation Oncology | Admitting: Radiation Oncology

## 2022-04-28 DIAGNOSIS — C61 Malignant neoplasm of prostate: Secondary | ICD-10-CM | POA: Diagnosis not present

## 2022-04-28 LAB — RAD ONC ARIA SESSION SUMMARY
Course Elapsed Days: 7
Plan Fractions Treated to Date: 6
Plan Prescribed Dose Per Fraction: 2.5 Gy
Plan Total Fractions Prescribed: 28
Plan Total Prescribed Dose: 70 Gy
Reference Point Dosage Given to Date: 15 Gy
Reference Point Session Dosage Given: 2.5 Gy
Session Number: 6

## 2022-04-29 ENCOUNTER — Other Ambulatory Visit: Payer: Self-pay

## 2022-04-29 ENCOUNTER — Ambulatory Visit
Admission: RE | Admit: 2022-04-29 | Discharge: 2022-04-29 | Disposition: A | Discharge status: Home / Self Care | Payer: Medicare Other | Source: Ambulatory Visit | Attending: Radiation Oncology | Admitting: Radiation Oncology

## 2022-04-29 DIAGNOSIS — C61 Malignant neoplasm of prostate: Secondary | ICD-10-CM | POA: Diagnosis not present

## 2022-04-29 LAB — RAD ONC ARIA SESSION SUMMARY
Course Elapsed Days: 8
Plan Fractions Treated to Date: 7
Plan Prescribed Dose Per Fraction: 2.5 Gy
Plan Total Fractions Prescribed: 28
Plan Total Prescribed Dose: 70 Gy
Reference Point Dosage Given to Date: 17.5 Gy
Reference Point Session Dosage Given: 2.5 Gy
Session Number: 7

## 2022-04-30 ENCOUNTER — Other Ambulatory Visit: Payer: Self-pay

## 2022-04-30 ENCOUNTER — Ambulatory Visit
Admission: RE | Admit: 2022-04-30 | Discharge: 2022-04-30 | Disposition: A | Discharge status: Home / Self Care | Payer: Medicare Other | Source: Ambulatory Visit | Attending: Radiation Oncology | Admitting: Radiation Oncology

## 2022-04-30 DIAGNOSIS — C61 Malignant neoplasm of prostate: Secondary | ICD-10-CM | POA: Diagnosis not present

## 2022-04-30 LAB — RAD ONC ARIA SESSION SUMMARY
Course Elapsed Days: 9
Plan Fractions Treated to Date: 8
Plan Prescribed Dose Per Fraction: 2.5 Gy
Plan Total Fractions Prescribed: 28
Plan Total Prescribed Dose: 70 Gy
Reference Point Dosage Given to Date: 20 Gy
Reference Point Session Dosage Given: 2.5 Gy
Session Number: 8

## 2022-05-01 ENCOUNTER — Other Ambulatory Visit: Payer: Self-pay

## 2022-05-01 ENCOUNTER — Ambulatory Visit
Admission: RE | Admit: 2022-05-01 | Discharge: 2022-05-01 | Disposition: A | Discharge status: Home / Self Care | Payer: Medicare Other | Source: Ambulatory Visit | Attending: Radiation Oncology | Admitting: Radiation Oncology

## 2022-05-01 DIAGNOSIS — C61 Malignant neoplasm of prostate: Secondary | ICD-10-CM | POA: Diagnosis not present

## 2022-05-01 LAB — RAD ONC ARIA SESSION SUMMARY
Course Elapsed Days: 10
Plan Fractions Treated to Date: 9
Plan Prescribed Dose Per Fraction: 2.5 Gy
Plan Total Fractions Prescribed: 28
Plan Total Prescribed Dose: 70 Gy
Reference Point Dosage Given to Date: 22.5 Gy
Reference Point Session Dosage Given: 2.5 Gy
Session Number: 9

## 2022-05-02 ENCOUNTER — Ambulatory Visit
Admission: RE | Admit: 2022-05-02 | Discharge: 2022-05-02 | Disposition: A | Discharge status: Home / Self Care | Payer: Medicare Other | Source: Ambulatory Visit | Attending: Radiation Oncology | Admitting: Radiation Oncology

## 2022-05-02 ENCOUNTER — Other Ambulatory Visit: Payer: Self-pay

## 2022-05-02 DIAGNOSIS — C61 Malignant neoplasm of prostate: Secondary | ICD-10-CM | POA: Diagnosis not present

## 2022-05-02 LAB — RAD ONC ARIA SESSION SUMMARY
Course Elapsed Days: 11
Plan Fractions Treated to Date: 10
Plan Prescribed Dose Per Fraction: 2.5 Gy
Plan Total Fractions Prescribed: 28
Plan Total Prescribed Dose: 70 Gy
Reference Point Dosage Given to Date: 25 Gy
Reference Point Session Dosage Given: 2.5 Gy
Session Number: 10

## 2022-05-04 ENCOUNTER — Ambulatory Visit
Admission: RE | Admit: 2022-05-04 | Discharge: 2022-05-04 | Disposition: A | Discharge status: Home / Self Care | Payer: Medicare Other | Source: Ambulatory Visit | Attending: Radiation Oncology | Admitting: Radiation Oncology

## 2022-05-04 ENCOUNTER — Other Ambulatory Visit: Payer: Self-pay

## 2022-05-04 DIAGNOSIS — C61 Malignant neoplasm of prostate: Secondary | ICD-10-CM | POA: Diagnosis not present

## 2022-05-04 LAB — RAD ONC ARIA SESSION SUMMARY
Course Elapsed Days: 13
Plan Fractions Treated to Date: 11
Plan Prescribed Dose Per Fraction: 2.5 Gy
Plan Total Fractions Prescribed: 28
Plan Total Prescribed Dose: 70 Gy
Reference Point Dosage Given to Date: 27.5 Gy
Reference Point Session Dosage Given: 2.5 Gy
Session Number: 11

## 2022-05-05 ENCOUNTER — Other Ambulatory Visit: Payer: Self-pay

## 2022-05-05 ENCOUNTER — Ambulatory Visit
Admission: RE | Admit: 2022-05-05 | Discharge: 2022-05-05 | Disposition: A | Discharge status: Home / Self Care | Payer: Medicare Other | Source: Ambulatory Visit | Attending: Radiation Oncology | Admitting: Radiation Oncology

## 2022-05-05 DIAGNOSIS — C61 Malignant neoplasm of prostate: Secondary | ICD-10-CM | POA: Diagnosis not present

## 2022-05-05 LAB — RAD ONC ARIA SESSION SUMMARY
Course Elapsed Days: 14
Plan Fractions Treated to Date: 12
Plan Prescribed Dose Per Fraction: 2.5 Gy
Plan Total Fractions Prescribed: 28
Plan Total Prescribed Dose: 70 Gy
Reference Point Dosage Given to Date: 30 Gy
Reference Point Session Dosage Given: 2.5 Gy
Session Number: 12

## 2022-05-06 ENCOUNTER — Other Ambulatory Visit: Payer: Self-pay

## 2022-05-06 ENCOUNTER — Ambulatory Visit
Admission: RE | Admit: 2022-05-06 | Discharge: 2022-05-06 | Disposition: A | Discharge status: Home / Self Care | Payer: Medicare Other | Source: Ambulatory Visit | Attending: Radiation Oncology | Admitting: Radiation Oncology

## 2022-05-06 DIAGNOSIS — C61 Malignant neoplasm of prostate: Secondary | ICD-10-CM | POA: Diagnosis not present

## 2022-05-06 LAB — RAD ONC ARIA SESSION SUMMARY
Course Elapsed Days: 15
Plan Fractions Treated to Date: 13
Plan Prescribed Dose Per Fraction: 2.5 Gy
Plan Total Fractions Prescribed: 28
Plan Total Prescribed Dose: 70 Gy
Reference Point Dosage Given to Date: 32.5 Gy
Reference Point Session Dosage Given: 2.5 Gy
Session Number: 13

## 2022-05-07 ENCOUNTER — Ambulatory Visit: Payer: Medicare Other

## 2022-05-07 ENCOUNTER — Ambulatory Visit
Admission: RE | Admit: 2022-05-07 | Discharge: 2022-05-07 | Disposition: A | Discharge status: Home / Self Care | Payer: Medicare Other | Source: Ambulatory Visit | Attending: Radiation Oncology | Admitting: Radiation Oncology

## 2022-05-07 ENCOUNTER — Other Ambulatory Visit: Payer: Self-pay

## 2022-05-07 ENCOUNTER — Ambulatory Visit: Admission: RE | Admit: 2022-05-07 | Payer: Medicare Other | Source: Ambulatory Visit

## 2022-05-07 DIAGNOSIS — C61 Malignant neoplasm of prostate: Secondary | ICD-10-CM | POA: Diagnosis not present

## 2022-05-07 LAB — RAD ONC ARIA SESSION SUMMARY
Course Elapsed Days: 16
Plan Fractions Treated to Date: 14
Plan Prescribed Dose Per Fraction: 2.5 Gy
Plan Total Fractions Prescribed: 28
Plan Total Prescribed Dose: 70 Gy
Reference Point Dosage Given to Date: 35 Gy
Reference Point Session Dosage Given: 2.5 Gy
Session Number: 14

## 2022-05-12 ENCOUNTER — Other Ambulatory Visit: Payer: Self-pay

## 2022-05-12 ENCOUNTER — Ambulatory Visit
Admission: RE | Admit: 2022-05-12 | Discharge: 2022-05-12 | Disposition: A | Discharge status: Home / Self Care | Payer: Medicare Other | Source: Ambulatory Visit | Attending: Radiation Oncology | Admitting: Radiation Oncology

## 2022-05-12 ENCOUNTER — Ambulatory Visit: Payer: Medicare Other

## 2022-05-12 DIAGNOSIS — C61 Malignant neoplasm of prostate: Secondary | ICD-10-CM | POA: Diagnosis not present

## 2022-05-12 LAB — RAD ONC ARIA SESSION SUMMARY
Course Elapsed Days: 21
Plan Fractions Treated to Date: 15
Plan Prescribed Dose Per Fraction: 2.5 Gy
Plan Total Fractions Prescribed: 28
Plan Total Prescribed Dose: 70 Gy
Reference Point Dosage Given to Date: 37.5 Gy
Reference Point Session Dosage Given: 2.5 Gy
Session Number: 15

## 2022-05-13 ENCOUNTER — Ambulatory Visit
Admission: RE | Admit: 2022-05-13 | Discharge: 2022-05-13 | Disposition: A | Discharge status: Home / Self Care | Payer: Medicare Other | Source: Ambulatory Visit | Attending: Radiation Oncology | Admitting: Radiation Oncology

## 2022-05-13 ENCOUNTER — Other Ambulatory Visit: Payer: Self-pay

## 2022-05-13 DIAGNOSIS — C61 Malignant neoplasm of prostate: Secondary | ICD-10-CM | POA: Diagnosis not present

## 2022-05-13 LAB — RAD ONC ARIA SESSION SUMMARY
Course Elapsed Days: 22
Plan Fractions Treated to Date: 16
Plan Prescribed Dose Per Fraction: 2.5 Gy
Plan Total Fractions Prescribed: 28
Plan Total Prescribed Dose: 70 Gy
Reference Point Dosage Given to Date: 40 Gy
Reference Point Session Dosage Given: 2.5 Gy
Session Number: 16

## 2022-05-14 ENCOUNTER — Other Ambulatory Visit: Payer: Self-pay

## 2022-05-14 ENCOUNTER — Ambulatory Visit
Admission: RE | Admit: 2022-05-14 | Discharge: 2022-05-14 | Disposition: A | Discharge status: Home / Self Care | Payer: Medicare Other | Source: Ambulatory Visit | Attending: Radiation Oncology | Admitting: Radiation Oncology

## 2022-05-14 DIAGNOSIS — C61 Malignant neoplasm of prostate: Secondary | ICD-10-CM | POA: Diagnosis not present

## 2022-05-14 LAB — RAD ONC ARIA SESSION SUMMARY
Course Elapsed Days: 23
Plan Fractions Treated to Date: 17
Plan Prescribed Dose Per Fraction: 2.5 Gy
Plan Total Fractions Prescribed: 28
Plan Total Prescribed Dose: 70 Gy
Reference Point Dosage Given to Date: 42.5 Gy
Reference Point Session Dosage Given: 2.5 Gy
Session Number: 17

## 2022-05-15 ENCOUNTER — Other Ambulatory Visit: Payer: Self-pay

## 2022-05-15 ENCOUNTER — Ambulatory Visit
Admission: RE | Admit: 2022-05-15 | Discharge: 2022-05-15 | Disposition: A | Discharge status: Home / Self Care | Payer: Medicare Other | Source: Ambulatory Visit | Attending: Radiation Oncology | Admitting: Radiation Oncology

## 2022-05-15 DIAGNOSIS — C61 Malignant neoplasm of prostate: Secondary | ICD-10-CM | POA: Diagnosis not present

## 2022-05-15 LAB — RAD ONC ARIA SESSION SUMMARY
Course Elapsed Days: 24
Plan Fractions Treated to Date: 18
Plan Prescribed Dose Per Fraction: 2.5 Gy
Plan Total Fractions Prescribed: 28
Plan Total Prescribed Dose: 70 Gy
Reference Point Dosage Given to Date: 45 Gy
Reference Point Session Dosage Given: 2.5 Gy
Session Number: 18

## 2022-05-16 ENCOUNTER — Ambulatory Visit
Admission: RE | Admit: 2022-05-16 | Discharge: 2022-05-16 | Disposition: A | Discharge status: Home / Self Care | Payer: Medicare Other | Source: Ambulatory Visit | Attending: Radiation Oncology | Admitting: Radiation Oncology

## 2022-05-16 ENCOUNTER — Other Ambulatory Visit: Payer: Self-pay

## 2022-05-16 DIAGNOSIS — C61 Malignant neoplasm of prostate: Secondary | ICD-10-CM | POA: Insufficient documentation

## 2022-05-16 LAB — RAD ONC ARIA SESSION SUMMARY
Course Elapsed Days: 25
Plan Fractions Treated to Date: 19
Plan Prescribed Dose Per Fraction: 2.5 Gy
Plan Total Fractions Prescribed: 28
Plan Total Prescribed Dose: 70 Gy
Reference Point Dosage Given to Date: 47.5 Gy
Reference Point Session Dosage Given: 2.5 Gy
Session Number: 19

## 2022-05-19 ENCOUNTER — Ambulatory Visit
Admission: RE | Admit: 2022-05-19 | Discharge: 2022-05-19 | Disposition: A | Discharge status: Home / Self Care | Payer: Medicare Other | Source: Ambulatory Visit | Attending: Radiation Oncology | Admitting: Radiation Oncology

## 2022-05-19 ENCOUNTER — Other Ambulatory Visit: Payer: Self-pay

## 2022-05-19 DIAGNOSIS — C61 Malignant neoplasm of prostate: Secondary | ICD-10-CM | POA: Diagnosis not present

## 2022-05-19 LAB — RAD ONC ARIA SESSION SUMMARY
Course Elapsed Days: 28
Plan Fractions Treated to Date: 20
Plan Prescribed Dose Per Fraction: 2.5 Gy
Plan Total Fractions Prescribed: 28
Plan Total Prescribed Dose: 70 Gy
Reference Point Dosage Given to Date: 50 Gy
Reference Point Session Dosage Given: 2.5 Gy
Session Number: 20

## 2022-05-20 ENCOUNTER — Other Ambulatory Visit: Payer: Self-pay

## 2022-05-20 ENCOUNTER — Ambulatory Visit
Admission: RE | Admit: 2022-05-20 | Discharge: 2022-05-20 | Disposition: A | Discharge status: Home / Self Care | Payer: Medicare Other | Source: Ambulatory Visit | Attending: Radiation Oncology | Admitting: Radiation Oncology

## 2022-05-20 DIAGNOSIS — C61 Malignant neoplasm of prostate: Secondary | ICD-10-CM | POA: Diagnosis not present

## 2022-05-20 LAB — RAD ONC ARIA SESSION SUMMARY
Course Elapsed Days: 29
Plan Fractions Treated to Date: 21
Plan Prescribed Dose Per Fraction: 2.5 Gy
Plan Total Fractions Prescribed: 28
Plan Total Prescribed Dose: 70 Gy
Reference Point Dosage Given to Date: 52.5 Gy
Reference Point Session Dosage Given: 2.5 Gy
Session Number: 21

## 2022-05-21 ENCOUNTER — Ambulatory Visit
Admission: RE | Admit: 2022-05-21 | Discharge: 2022-05-21 | Disposition: A | Discharge status: Home / Self Care | Payer: Medicare Other | Source: Ambulatory Visit | Attending: Radiation Oncology | Admitting: Radiation Oncology

## 2022-05-21 ENCOUNTER — Other Ambulatory Visit: Payer: Self-pay

## 2022-05-21 DIAGNOSIS — C61 Malignant neoplasm of prostate: Secondary | ICD-10-CM | POA: Diagnosis not present

## 2022-05-21 LAB — RAD ONC ARIA SESSION SUMMARY
Course Elapsed Days: 30
Plan Fractions Treated to Date: 22
Plan Prescribed Dose Per Fraction: 2.5 Gy
Plan Total Fractions Prescribed: 28
Plan Total Prescribed Dose: 70 Gy
Reference Point Dosage Given to Date: 55 Gy
Reference Point Session Dosage Given: 2.5 Gy
Session Number: 22

## 2022-05-22 ENCOUNTER — Other Ambulatory Visit: Payer: Self-pay

## 2022-05-22 ENCOUNTER — Ambulatory Visit
Admission: RE | Admit: 2022-05-22 | Discharge: 2022-05-22 | Disposition: A | Discharge status: Home / Self Care | Payer: Medicare Other | Source: Ambulatory Visit | Attending: Radiation Oncology | Admitting: Radiation Oncology

## 2022-05-22 DIAGNOSIS — C61 Malignant neoplasm of prostate: Secondary | ICD-10-CM | POA: Diagnosis not present

## 2022-05-22 LAB — RAD ONC ARIA SESSION SUMMARY
Course Elapsed Days: 31
Plan Fractions Treated to Date: 23
Plan Prescribed Dose Per Fraction: 2.5 Gy
Plan Total Fractions Prescribed: 28
Plan Total Prescribed Dose: 70 Gy
Reference Point Dosage Given to Date: 57.5 Gy
Reference Point Session Dosage Given: 2.5 Gy
Session Number: 23

## 2022-05-23 ENCOUNTER — Ambulatory Visit
Admission: RE | Admit: 2022-05-23 | Discharge: 2022-05-23 | Disposition: A | Discharge status: Home / Self Care | Payer: Medicare Other | Source: Ambulatory Visit | Attending: Radiation Oncology | Admitting: Radiation Oncology

## 2022-05-23 ENCOUNTER — Other Ambulatory Visit: Payer: Self-pay

## 2022-05-23 DIAGNOSIS — C61 Malignant neoplasm of prostate: Secondary | ICD-10-CM | POA: Diagnosis not present

## 2022-05-23 LAB — RAD ONC ARIA SESSION SUMMARY
Course Elapsed Days: 32
Plan Fractions Treated to Date: 24
Plan Prescribed Dose Per Fraction: 2.5 Gy
Plan Total Fractions Prescribed: 28
Plan Total Prescribed Dose: 70 Gy
Reference Point Dosage Given to Date: 60 Gy
Reference Point Session Dosage Given: 2.5 Gy
Session Number: 24

## 2022-05-26 ENCOUNTER — Encounter: Payer: Self-pay | Admitting: Cardiology

## 2022-05-26 ENCOUNTER — Other Ambulatory Visit: Payer: Self-pay

## 2022-05-26 ENCOUNTER — Ambulatory Visit
Admission: RE | Admit: 2022-05-26 | Discharge: 2022-05-26 | Disposition: A | Discharge status: Home / Self Care | Payer: Medicare Other | Source: Ambulatory Visit | Attending: Radiation Oncology | Admitting: Radiation Oncology

## 2022-05-26 ENCOUNTER — Telehealth: Payer: Self-pay

## 2022-05-26 ENCOUNTER — Ambulatory Visit: Payer: No Typology Code available for payment source | Attending: Cardiology | Admitting: Cardiology

## 2022-05-26 VITALS — BP 128/62 | HR 66 | Ht 74.0 in | Wt 251.2 lb

## 2022-05-26 DIAGNOSIS — I2510 Atherosclerotic heart disease of native coronary artery without angina pectoris: Secondary | ICD-10-CM

## 2022-05-26 DIAGNOSIS — F17200 Nicotine dependence, unspecified, uncomplicated: Secondary | ICD-10-CM

## 2022-05-26 DIAGNOSIS — E785 Hyperlipidemia, unspecified: Secondary | ICD-10-CM

## 2022-05-26 DIAGNOSIS — I480 Paroxysmal atrial fibrillation: Secondary | ICD-10-CM | POA: Diagnosis not present

## 2022-05-26 DIAGNOSIS — C61 Malignant neoplasm of prostate: Secondary | ICD-10-CM | POA: Diagnosis not present

## 2022-05-26 DIAGNOSIS — I25118 Atherosclerotic heart disease of native coronary artery with other forms of angina pectoris: Secondary | ICD-10-CM

## 2022-05-26 DIAGNOSIS — Z9861 Coronary angioplasty status: Secondary | ICD-10-CM

## 2022-05-26 DIAGNOSIS — I10 Essential (primary) hypertension: Secondary | ICD-10-CM

## 2022-05-26 DIAGNOSIS — Z7901 Long term (current) use of anticoagulants: Secondary | ICD-10-CM

## 2022-05-26 LAB — RAD ONC ARIA SESSION SUMMARY
Course Elapsed Days: 35
Plan Fractions Treated to Date: 25
Plan Prescribed Dose Per Fraction: 2.5 Gy
Plan Total Fractions Prescribed: 28
Plan Total Prescribed Dose: 70 Gy
Reference Point Dosage Given to Date: 62.5 Gy
Reference Point Session Dosage Given: 2.5 Gy
Session Number: 25

## 2022-05-26 NOTE — Patient Instructions (Addendum)

## 2022-05-26 NOTE — Telephone Encounter (Signed)
   Pre-operative Risk Assessment    Patient Name: John Love  DOB: 1951-03-18 MRN: [id number]      Request for Surgical Clearance    Procedure:   Colonoscopy  Date of Surgery:  Clearance TBD                                 Surgeon:  Dr. Kirtland Bouchard Group or Practice Name:  [facility] Phone number:  [phone] Fax number:  [phone]   Type of Clearance Requested:   - Medical  - Pharmacy:  Hold Apixaban (Eliquis) 2-3 days prior to procedure    Type of Anesthesia:  Not Indicated   Additional requests/questions:    Tana Conch   05/26/2022, 4:40 PM

## 2022-05-26 NOTE — Progress Notes (Signed)
Primary Care Provider: Patient, No Pcp Per [place] Cardiologist: [name], MD Electrophysiologist: None  Clinic Note: Chief Complaint  Patient presents with   Follow-up    Doing pretty well.  No major issues.   Coronary Artery Disease    No angina   Atrial Fibrillation    Asymptomatic.  Doing well.  Rate controlled   ===================================  ASSESSMENT/PLAN   Problem List Items Addressed This Visit       Cardiology Problems   PAF (paroxysmal atrial fibrillation) ([place]); CHA2DS2VASC 3; Eliquis (Chronic)    Seems be doing well without any recurrent episodes.  Likely exacerbated by his other ongoing issues including lung cancer.  Rate well-controlled on metoprolol.  Tolerating Eliquis well without any issues.  Okay to hold Eliquis for procedures and surgeries.  Would hold for 48 hours.  For spinal procedures would hold for 72 hours.  No need for bridging.      Coronary artery disease of native artery of native heart with stable angina pectoris (HCC) (Chronic)    Status post multivessel PCI all related to his VT arrest post COVID shot.  Now seems doing well no active angina symptoms.  He is unfortunately more debilitated than expected because of events that occurred after his MI including diagnosed with lung cancer.    He remains on Eliquis which is standing dose for A-fib.  Not on antiplatelet agent. Is on statin and beta-blocker which we will leave alone.  Would be okay to hold Eliquis for procedures or surgeries.  I think were probably okay doing this without bridging.      CAD S/P 4 V PCI - Primary (Chronic)    With initial plans of lifelong Plavix, the need for Eliquis made a change.  Plavix was stopped after 1 year of therapy post PCI in lieu of continuing twice daily Eliquis for both coverage.  Obviously not on aspirin as well.  Stents seem to be doing relatively well since he is not having active anginal symptoms.  On stable regimen of  50 mg twice daily Lopressor and atorvastatin      Essential hypertension (Chronic)    BP is stable today.  He is only on Lopressor.  Monitor closely, may require additional therapy but for now we will hold off on titrating further until all of his cancer related treatments are done.      Hyperlipidemia with target LDL less than 70 (Chronic)    Last send lipids look great.  Continue current dose of statin.        Other   Current use of long term anticoagulation (Chronic)    More than 2 years out from when he was diagnosed with A-fib and beyond that for PCI.  He has not had a stroke that I am aware of and therefore we do not need to necessarily be overly aggressive with bridging for procedures or surgeries.  Okay to hold Eliquis for 48 to 72 hours preop for surgeries or procedures-the is without bridging.[name], MD       Current every day smoker (Chronic)    Talk smoking cessation, but he did not seem to be quite ready for it yet.  Working on it but not ready to completely get rid of it.  He understands the risk of taking.       ===================================  HPI:    John Love is a 71 y.o. male with a complex PMH reviewed below who presents today for  6 month f/u - doing well .  08/09/2019: V Fib arrest during 15 min observation period after 2nd COVID Vaccine => apparent syncope - noted to be pulseless -> CPR (confirmed VF) -> ROSC => Lateral STEMI ->  Cath: 3 V CAD: prox RCA 80%, Ost-prox RCA 50%; D1 95% with prox-mid LAD 75%, & mid-distal LCx 80%-OM3 100% => DES PCI to m-dLCx & DES x 2 to RCA -> d/c on 08/19/2019 Echo EF 55-60%. No RWMA.  08/29/2019: Staged Atherectomy-Bifurcation PCI of LAD-Diag 3 Stentrs in LAD & 1 in Ost Diag. 01/28/2020: [facility] => hemoptysis; CT Chest showed lung mass - 2 rounds of Abx => Seen by Dr. Hilton Cork 02/02/2020 -> CT showed RLL mass c/w malignancy => PET on 02/22/20; Brilinta held for Bx Bronchoscopy 8/26 -> Dx SSCa in  RLL -C lung cancer treatment below. Squamous Cell Lung Cancer (Right Lower Lobe -> T3N2Mo - 02/09/2020)  04/06/2020 -NEW ONSET A-FIB> returned to [facility] - noted to be in Afib => unfortunately, he was initiated  on Eliquis, aspirin and Brilinta Nov 16-25/21: Admitted with GI bleed => aspirin Plavix restarted with plan to restart Eliquis after 1 month and stop aspirin.  Amlodipine was discontinued due to hypotension.  I last saw John Love on November 22, 2021.  He is companied by his wife.  They are in good spirits.  Happy and jovial.  He had played golf the day before.  He walks with a golf cart to the whole or to the green and back.  Does not necessarily walk that much unless he is try to find a [employer] balls.  But he denies any chest pain or pressure with rest or exertion.  No palpitations or irregular heartbeats to suggest recurrence of A-fib.  No real CHF symptoms either.  No PND orthopnea just some mild edema of the seem to be dependent in nature.  Had some difficulty tying his shoes at on occasion.  No further GI bleeds, and happy to be on the 57-monthfollow-up plan for lung cancer as well.  Recent Hospitalizations:  No hospitalizations, undergoing treatment for cancer.  Reviewed  CV studies:    The following studies were reviewed today: (if available, images/films reviewed: From Epic Chart or Care Everywhere) None:  Interval History:   John KRUEGERreturns today overall doing pretty well.  He really denies any major cardiac symptoms just some mild and a swelling.  The plan is for them to recheck a chest x-ray to determine what the next thing they can do for his cancer.  He has a skin tag to the treatments, they take a lot out of him.  He said he tried Chantix for smoking cessation and had nightmares.  Is not sure if he STEMI other quit smoking, has cut down a lot.  He does get short of breath walking around doing things but denies any PND orthopnea.  Trivial edema.  Only the end of  the day.  No further chest pain or pressure symptoms.  No symptoms of A-fib either rapid heartbeat/palpitations.  No syncope or near syncope.  No bleeding issues.  REVIEWED OF SYSTEMS   Pertinent symptoms not necessarily listed above. Somewhat worn out from all of his therapy, maybe a little bit of fatigue but doing better.  Energy level is not great. Feet swelling Off-and-on constipation Joint neck pain-stiffness.   Dizzy with bending over.  I have reviewed and (if needed) personally updated the patient's problem  list, medications, allergies, past medical and surgical history, social and family history.   PAST MEDICAL HISTORY   Past Medical History:  Diagnosis Date   Anticoagulated    eliquis --- managed by cardiology   Benign localized prostatic hyperplasia with lower urinary tract symptoms (LUTS)    CAD S/P 4V PCI 08/10/2019   08/09/19: m-dLCx 80%--OM2 100% @ small branch take-off. --> DES PCI: 2.25 mm x 28 mm Synergy DES (2.75 mm);  pRCA 50%-80% tandem lesions --> DES PCI: 3.5 mm x 24 mm Synergy DES (4.1 mm) 08/29/19: Staged Bifurcation PCI of p-dLAD_0  with CSI atherectomy and scoring low PTA of ostial D1-  (4 total stents, initial stent from LAD into D1 (Synergy 2.75 x 16-2.9 mm, with 3 overlapping stents in L   ED (erectile dysfunction)    Full dentures    H/O: upper GI bleed 05/01/2020   Admitted with acute blood loss anemia-4+ units PRBC.  Provocative bleeding scan-small bowel AVMs s/p Cauterization 05/08/2020   History of anemia    History of cancer chemotherapy    06-11-2020  to 06-07-2021 lung cancer   History of external beam radiation therapy    03-21-2020  to  05-11-2020  RLL   History of ST elevation myocardial infarction (STEMI) 08/09/2019   History of sudden cardiac arrest successfully resuscitated 08/09/2019   15 minutes following second COVID-19 vaccination -> vasovagal episode during the hypotension followed by VF cardiac arrest >20 min@ [facility]; found  to have 4 vessel CAD on cath with culprit 100% OM 3--now status post four-vessel PCI   History of sustained ventricular fibrillation 08/09/2019   In setting of acute MI   Malignant neoplasm [facility]) 12/2021   urologist--- dr eskridge/   radiation onologist--- dr Tammi Klippel;   dx 07/ 2023   Multiple vessel coronary artery disease 08/10/2019   Heart Cath -PCI 08/09/2019:  CULPRIT LESION: Mid Cx to Dist Cx lesion is 80% stenosed. 3rd Mrg lesion is 100% stenosed at small branch take-off. DES PCI: 2.25 mm x 28 mm Synergy DES (proximal postdilated 2.75 mm.  Post intervention, there is a 0% residual stenosis.  Small side branch was occluded Ost RCA to Prox RCA lesion is 50% stenosed.Prox RCA lesion is 80% stenosed.  DES PCI: 3.5 mm x 24 mm Sy   Paroxysmal atrial fibrillation ([place]) 04/06/2020   cardiologist---- dr Ellyn Hack Presented to [facility] -in A. fib RVR  04-07-2020  s/p DCCV   Pre-diabetes    S/P drug eluting coronary stent placement 08/09/2019   total 6 stents involving 4V   Squamous cell carcinoma of bronchus in right lower lobe ([place]) 01/2020   follow[facility] by [facility] in [facility]  (Dr Harlow Asa) CT scan 01/28/2020-status post bronchoscopy with biopsy -> pathology T3, N2, M0 poorly differentiated; MRI Brain - NO intracranial Mets.;   completed chemo 06-07-2021 and radiation 05-11-2010    PAST SURGICAL HISTORY   Past Surgical History:  Procedure Laterality Date   COLONOSCOPY WITH ESOPHAGOGASTRODUODENOSCOPY (EGD)  05/02/2020   [facility]: No evidence of acute bleeding, colon polyps noted.   CORONARY ATHERECTOMY N/A 08/29/2019   Procedure: CORONARY ATHERECTOMY;  Surgeon: Leonie Man, MD;  Location: [place] [facility];  Service: Cardiovascular; SCORING BALLOON ANGIOPLASTY- ostial D1, CSI ATHERECTOMY p-mLAD -> followed by bifurcation stenting   CORONARY STENT INTERVENTION N/A 08/09/2019   Procedure: CORONARY STENT INTERVENTION;  Surgeon: Lorretta Harp, MD::  2V PCI: Culprit lesion ~100%  OM3 (with 80% main LCx) --> DES PCI  covering both lesions-Synergy DES 2.25 mm x 28 mm, postdilated to 2.75 mm; LESION #2 proximal RCA 50% followed by 80% --> DES PCI covering both-Synergy DES 3.5 mm x 24 mm   CORONARY STENT INTERVENTION N/A 08/29/2019   Procedure: CORONARY STENT INTERVENTION;  Surgeon: Leonie Man, MD:: Bifurcation DES PCI, Reverse Culotte Technique W/ KB (4 total SYNERGY DES stents, 1st (2.75X16 - 2.9 MM) - pLAD-ostD1 & 3 overlapping in LAD (3.0 x 38, 3.0 x 16, 2.75 x16 - tapered POT postdilation 3.6-3.1 & apical LAD PTA only.   CYSTOSCOPY N/A 08/18/2019   Procedure: CYSTOSCOPY CLOT EVACUATION FULGURATION 0.5-2 CM.;  Surgeon: Festus Aloe, MD;  Location: [place];  Service: Urology;  Laterality: N/A;   ESOPHAGOGASTRODUODENOSCOPY W/ BANDING  05/08/2020   [facility]: identified Small Bowel AVMs - > Rx with Cauterization   GOLD SEED IMPLANT N/A 04/04/2022   Procedure: GOLD SEED IMPLANT;  Surgeon: Vira Agar, MD;  Location: [facility];  Service: Urology;  Laterality: N/A;   LEFT HEART CATH AND CORONARY ANGIOGRAPHY N/A 08/09/2019   Procedure: RIGHT & LEFT HEART CATH AND CORONARY ANGIOGRAPHY;  Surgeon: Lorretta Harp, MD;  Location: [place] [facility]:: post-arrest : 80% LCx-100% OM3 (DES PCI), pRCA tandem 50 & 80% (DES PCI), Bifurcation LAD (75% calcified)-ostial D1 95%.  LVEDP 11 mmHg, PCWP 15 mmHg.    MESENTERIC ARTERIOGRAM  05/07/2020   [facility]: No evidence of bleed or target ASM for treatment (Presented with ABLA - UGIB)    SPACE OAR INSTILLATION N/A 04/04/2022   Procedure: SPACE OAR INSTILLATION;  Surgeon: Vira Agar, MD;  Location: [facility];  Service: Urology;  Laterality: N/A;   TRANSTHORACIC ECHOCARDIOGRAM  08/10/2019    Post cardiac arrest: EF 55 to 60%.  No R WMA.  GR 1 DD.  Normal RV.  Normal valves.   TRANSTHORACIC ECHOCARDIOGRAM  02/23/2020   normal LV systolic function with an EF of 55-60%. The left atrium was mildly  dilated.   Trivial pericardial effusion.   VIDEO BRONCHOSCOPY WITH RADIAL ENDOBRONCHIAL ULTRASOUND  02/09/2020   [facility]: Dr. Hilton Cork => for RLL Lung mass: Prominent adenopathy in the paratracheal (2R-4R) station, subcarinal station(7), and 11 L.  Complex adenopathy/Mass in R [place] (unable to determine nodes versus direct tumor invasion). EBUS-transBronch Needle Asp @ 11L, 7 & 2R LNs-> No evidence of Malignancy; TO of Bronchus Intermedius w/ both endobronchial tumor & extrinsic compression. Bx taken.    Diagnostic    Dominance: Right        Intervention     Diagnostic              Intervention   Immunization History  Administered Date(s) Administered   PFIZER(Purple Top)SARS-COV-2 Vaccination 07/19/2019, 08/09/2019    MEDICATIONS/ALLERGIES   Current Meds  Medication Sig   ascorbic acid (VITAMIN C) 500 MG tablet 1 tab(s)   atorvastatin (LIPITOR) 40 MG tablet TAKE 1 TABLET(40 MG) BY MOUTH DAILY (Patient taking differently: Take 40 mg by mouth at bedtime.)   Bicalutamide (CASODEX PO) Take 1 tablet by mouth daily. "Testosterone blocker from urology office" pt unsure of exact name   buPROPion (WELLBUTRIN XL) 150 MG 24 hr tablet Take 1 tablet (150 mg total) by mouth 2 (two) times daily. Take second dose early evening   ELIQUIS 5 MG TABS tablet TAKE 1 TABLET(5 MG) BY MOUTH TWICE DAILY (Patient taking differently: Take 5 mg by mouth 2 (two) times daily.)   metoprolol tartrate (LOPRESSOR) 50 MG tablet  TAKE 1 TABLET(50 MG) BY MOUTH TWICE DAILY (Patient taking differently: Take 50 mg by mouth 2 (two) times daily.)   Multiple Vitamin (MULTIVITAMIN ADULT PO) Take 2 tablets by mouth daily.   ORGOVYX 120 MG TABS Take 1 tablet by mouth daily.   tadalafil (CIALIS) 10 MG tablet May take 1 to 2 tablet as needed for erectile dysfunction. No more than 2 tablets in a 36 hout period. (Patient taking differently: daily as needed. May take 1 to 2 tablet as needed for erectile dysfunction. No more than 2  tablets in a 36 hout period.)   tamsulosin (FLOMAX) 0.4 MG CAPS capsule Take 1 capsule (0.4 mg total) by mouth daily after supper. (Patient taking differently: Take 0.4 mg by mouth 2 (two) times daily.)   triamcinolone (KENALOG) 0.1 % Apply topically 2 (two) times daily.    No Known Allergies  SOCIAL HISTORY/FAMILY HISTORY   Reviewed in Epic:  Pertinent findings:  Social History   Tobacco Use   Smoking status: Every Day    Packs/day: 0.75    Years: 49.00    Total pack years: 36.75    Types: Cigarettes   Smokeless tobacco: Never   Tobacco comments:    Pt stopped 02/ 2021 after cardiac arrest w/ cardiac intervention but restarted back (started smoking age 38)  Vaping Use   Vaping Use: Never used  Substance Use Topics   Alcohol use: Yes    Alcohol/week: 28.0 - 35.0 standard drinks of alcohol    Types: 28 - 35 Cans of beer per week    Comment: 03-31-2022  pt stated drinks 3-4 beer daily (12 oz each)   Drug use: Never   Social History   Social History Narrative   Not on file    OBJCTIVE -PE, EKG, labs   Wt Readings from Last 3 Encounters:  05/26/22 251 lb 3.2 oz (113.9 kg)  04/04/22 254 lb 4.8 oz (115.3 kg)  01/28/22 244 lb 8 oz (110.9 kg)    Physical Exam: BP 128/62 (BP Location: Left Arm, Patient Position: Sitting, Cuff Size: Normal)   Pulse 66   Ht _0  (1.88 m)   Wt 251 lb 3.2 oz (113.9 kg)   SpO2 99%   BMI 32.25 kg/m  Physical Exam Constitutional:      General: He is not in acute distress.    Appearance: Normal appearance. He is obese. He is not ill-appearing (Well-nourished, well-groomed.  Relatively healthy appearing.) or toxic-appearing.  HENT:     Head: Normocephalic and atraumatic.  Cardiovascular:     Rate and Rhythm: Normal rate and regular rhythm.     Pulses: Normal pulses.     Heart sounds: No murmur heard.    No friction rub. No gallop.  Pulmonary:     Effort: Pulmonary effort is normal. No respiratory distress.     Breath sounds: No rales.      Comments: Course diffuse rhonchorous sounds throughout the entire right lung field with expiratory wheeze. Chest:     Chest wall: Tenderness (Some soreness along the right side of his chest) present.  Musculoskeletal:        General: Swelling (1-2+ pedal edema.) present.  Skin:    General: Skin is warm and dry.  Neurological:     General: No focal deficit present.     Mental Status: He is alert and oriented to person, place, and time.     Gait: Gait abnormal.  Psychiatric:        Mood  and Affect: Mood normal.        Behavior: Behavior normal.        Thought Content: Thought content normal.        Judgment: Judgment normal.      Adult ECG Report Not checked  Recent Labs: Reviewed Lab Results  Component Value Date   CHOL 96 (L) 11/22/2021   HDL 33 (L) 11/22/2021   LDLCALC 44 11/22/2021   TRIG 101 11/22/2021   CHOLHDL 2.9 11/22/2021   Lab Results  Component Value Date   CREATININE 0.90 04/04/2022   BUN 15 04/04/2022   NA 142 04/04/2022   K 4.3 04/04/2022   CL 104 04/04/2022   CO2 26 11/22/2021      Latest Ref Rng & Units 04/04/2022    6:55 AM 11/22/2021    9:16 AM 12/08/2019    9:04 AM  CBC  WBC 3.4 - 10.8 x10E3/uL  7.5  10.0   Hemoglobin 13.0 - 17.0 g/dL 15.0  14.1  12.7   Hematocrit 39.0 - 52.0 % 44.0  43.2  39.5   Platelets 150 - 450 x10E3/uL  228  457     Lab Results  Component Value Date   HGBA1C 6.1 (H) 11/22/2021   No results found for: "TSH"  ================================================== I spent a total of 35mnutes with the patient spent in direct patient consultation.  Additional time spent with chart review  / charting (studies, outside notes, etc): 16 min Total Time: 36 min  Current medicines are reviewed at length with the patient today.  (+/- concerns) apparently has potential GI procedure coming up.  Questions about anticoagulation.  Notice: This dictation was prepared with Dragon dictation along with smart phrase technology. Any  transcriptional errors that result from this process are unintentional and may not be corrected upon review.  Studies Ordered:   No orders of the defined types were placed in this encounter.  No orders of the defined types were placed in this encounter.   Patient Instructions / Medication Changes & Studies & Tests Ordered   Patient Instructions  Medication Instructions:   No changes   *If you need a refill on your cardiac medications before your next appointment, please call your pharmacy*   Lab Work:  Not needed    Testing/Procedures:  Not needed  Follow-Up: At [facility] you and your health needs are our priority.  As part of our continuing mission to provide you with exceptional heart care, we have created designated Provider Care Teams.  These Care Teams include your primary Cardiologist (physician) and Advanced Practice Providers (APPs -  Physician Assistants and Nurse Practitioners) who all work together to provide you with the care you need, when you need it.     Your next appointment:   6 month(s)  The format for your next appointment:   In Person  Provider:   DGlenetta Hew MD        DLeonie Man MD, MS DGlenetta Hew M.D., M.S. Interventional Cardiologist  [place] Pager # [phone] # [phone] [place] [address]  Thank you for choosing [facility]!

## 2022-05-27 ENCOUNTER — Ambulatory Visit
Admission: RE | Admit: 2022-05-27 | Discharge: 2022-05-27 | Disposition: A | Discharge status: Home / Self Care | Payer: Medicare Other | Source: Ambulatory Visit | Attending: Radiation Oncology | Admitting: Radiation Oncology

## 2022-05-27 ENCOUNTER — Other Ambulatory Visit: Payer: Self-pay

## 2022-05-27 DIAGNOSIS — C61 Malignant neoplasm of prostate: Secondary | ICD-10-CM | POA: Diagnosis not present

## 2022-05-27 LAB — RAD ONC ARIA SESSION SUMMARY
Course Elapsed Days: 36
Plan Fractions Treated to Date: 26
Plan Prescribed Dose Per Fraction: 2.5 Gy
Plan Total Fractions Prescribed: 28
Plan Total Prescribed Dose: 70 Gy
Reference Point Dosage Given to Date: 65 Gy
Reference Point Session Dosage Given: 2.5 Gy
Session Number: 26

## 2022-05-28 ENCOUNTER — Other Ambulatory Visit: Payer: Self-pay

## 2022-05-28 ENCOUNTER — Ambulatory Visit
Admission: RE | Admit: 2022-05-28 | Discharge: 2022-05-28 | Disposition: A | Discharge status: Home / Self Care | Payer: Medicare Other | Source: Ambulatory Visit | Attending: Radiation Oncology | Admitting: Radiation Oncology

## 2022-05-28 DIAGNOSIS — C61 Malignant neoplasm of prostate: Secondary | ICD-10-CM | POA: Diagnosis not present

## 2022-05-28 LAB — RAD ONC ARIA SESSION SUMMARY
Course Elapsed Days: 37
Plan Fractions Treated to Date: 27
Plan Prescribed Dose Per Fraction: 2.5 Gy
Plan Total Fractions Prescribed: 28
Plan Total Prescribed Dose: 70 Gy
Reference Point Dosage Given to Date: 67.5 Gy
Reference Point Session Dosage Given: 2.5 Gy
Session Number: 27

## 2022-05-28 NOTE — Telephone Encounter (Signed)
Patient on Eliquis for PAF. Will route to pharmacy team for input.    CHA2DS2-VASc Score = 3   This indicates a 3.2% annual risk of stroke. The patient's score is based upon: CHF History: 0 HTN History: 1 Diabetes History: 0 Stroke History: 0 Vascular Disease History: 1 Age Score: 1 Gender Score: 0     Platelet count: 11/22/2021: Platelets 228   Creatinine clearance: 102 mL/min (adjusted for weight)  04/04/2022: Creatinine, Ser 0.90; Hemoglobin 15.0     John Dubonnet, NP  05/28/22  10:45 AM

## 2022-05-28 NOTE — Telephone Encounter (Signed)
Patient with diagnosis of afib on Eliquis for anticoagulation.    Procedure: colonoscopy Date of procedure: TBD  CHA2DS2-VASc Score = 3  This indicates a 3.2% annual risk of stroke. The patient's score is based upon: CHF History: 0 HTN History: 1 Diabetes History: 0 Stroke History: 0 Vascular Disease History: 1 Age Score: 1 Gender Score: 0   CrCl >171mL/min Platelet count 193K  Per office protocol, patient can hold Eliquis for 2 days prior to procedure.    **This guidance is not considered finalized until pre-operative APP has relayed final recommendations.**

## 2022-05-29 ENCOUNTER — Encounter: Payer: Self-pay | Admitting: Urology

## 2022-05-29 ENCOUNTER — Ambulatory Visit: Payer: Medicare Other

## 2022-05-29 ENCOUNTER — Ambulatory Visit
Admission: RE | Admit: 2022-05-29 | Discharge: 2022-05-29 | Disposition: A | Discharge status: Home / Self Care | Payer: Medicare Other | Source: Ambulatory Visit | Attending: Radiation Oncology | Admitting: Radiation Oncology

## 2022-05-29 ENCOUNTER — Other Ambulatory Visit: Payer: Self-pay

## 2022-05-29 DIAGNOSIS — C61 Malignant neoplasm of prostate: Secondary | ICD-10-CM | POA: Diagnosis not present

## 2022-05-29 LAB — RAD ONC ARIA SESSION SUMMARY
Course Elapsed Days: 38
Plan Fractions Treated to Date: 28
Plan Prescribed Dose Per Fraction: 2.5 Gy
Plan Total Fractions Prescribed: 28
Plan Total Prescribed Dose: 70 Gy
Reference Point Dosage Given to Date: 70 Gy
Reference Point Session Dosage Given: 2.5 Gy
Session Number: 28

## 2022-05-29 NOTE — Telephone Encounter (Signed)
Okay to stop for 2 days.  I was not aware that he is having a colonoscopy done. His note is still pending for me to complete.  I should be dictating it in the next day or so.  Will go ahead and comment on that in my note.  Dh

## 2022-05-30 ENCOUNTER — Ambulatory Visit: Payer: Medicare Other

## 2022-05-31 ENCOUNTER — Encounter: Payer: Self-pay | Admitting: Cardiology

## 2022-05-31 NOTE — Assessment & Plan Note (Signed)
With initial plans of lifelong Plavix, the need for Eliquis made a change.  Plavix was stopped after 1 year of therapy post PCI in lieu of continuing twice daily Eliquis for both coverage.  Obviously not on aspirin as well.  Stents seem to be doing relatively well since he is not having active anginal symptoms.  On stable regimen of 50 mg twice daily Lopressor and atorvastatin

## 2022-05-31 NOTE — Assessment & Plan Note (Signed)
Status post multivessel PCI all related to his VT arrest post COVID shot.  Now seems doing well no active angina symptoms.  He is unfortunately more debilitated than expected because of events that occurred after his MI including diagnosed with lung cancer.    He remains on Eliquis which is standing dose for A-fib.  Not on antiplatelet agent. Is on statin and beta-blocker which we will leave alone.  Would be okay to hold Eliquis for procedures or surgeries.  I think were probably okay doing this without bridging.

## 2022-05-31 NOTE — Assessment & Plan Note (Signed)
More than 2 years out from when he was diagnosed with A-fib and beyond that for PCI.  He has not had a stroke that I am aware of and therefore we do not need to necessarily be overly aggressive with bridging for procedures or surgeries.  Okay to hold Eliquis for 48 to 72 hours preop for surgeries or procedures-the is without bridging.[name], MD

## 2022-05-31 NOTE — Assessment & Plan Note (Signed)
Seems be doing well without any recurrent episodes.  Likely exacerbated by his other ongoing issues including lung cancer.  Rate well-controlled on metoprolol.  Tolerating Eliquis well without any issues.  Okay to hold Eliquis for procedures and surgeries.  Would hold for 48 hours.  For spinal procedures would hold for 72 hours.  No need for bridging.

## 2022-05-31 NOTE — Assessment & Plan Note (Signed)
Talk smoking cessation, but he did not seem to be quite ready for it yet.  Working on it but not ready to completely get rid of it.  He understands the risk of taking.

## 2022-05-31 NOTE — Assessment & Plan Note (Signed)
BP is stable today.  He is only on Lopressor.  Monitor closely, may require additional therapy but for now we will hold off on titrating further until all of his cancer related treatments are done.

## 2022-05-31 NOTE — Assessment & Plan Note (Signed)
Last send lipids look great.  Continue current dose of statin.

## 2022-06-02 NOTE — Telephone Encounter (Signed)
   Patient Name: John Love  DOB: 11-07-1950 MRN: [id number]  Primary Cardiologist: [name], MD  Chart reviewed as part of pre-operative protocol coverage. Patient has an upcoming colonoscopy planned. He was recently seen by Dr. Ellyn Hack on 05/26/2022 at which time he was stable from a cardiac standpoint. Per Dr. Ellyn Hack, ok to hold Eliquis for 2 days prior to colonoscopy. Please restart this as soon as safely possible afterwards.   I will route this recommendation to the requesting party via Epic fax function and remove from pre-op pool.  Please call with questions.  Darreld Mclean, PA-C 06/02/2022, 1:38 PM

## 2022-06-17 ENCOUNTER — Other Ambulatory Visit: Payer: Self-pay | Admitting: Cardiology

## 2022-06-17 NOTE — Telephone Encounter (Signed)
Prescription refill request for Eliquis received. Indication:afib Last office visit:12/23 Scr:0.8 Age: 72 Weight:113.9  kg  Prescription refilled

## 2022-06-20 ENCOUNTER — Encounter: Payer: Self-pay | Admitting: Cardiology

## 2022-06-20 ENCOUNTER — Telehealth: Payer: Self-pay | Admitting: Cardiology

## 2022-06-20 NOTE — Telephone Encounter (Signed)
Error

## 2022-06-20 NOTE — Telephone Encounter (Signed)
Spoke with pt wife, aware she will need to go to the pharmacy and ask for tablets until he can get the medication.

## 2022-06-20 NOTE — Telephone Encounter (Signed)
Pt c/o medication issue:  1. Name of Medication: ELIQUIS 5 MG TABS tablet    2. How are you currently taking this medication (dosage and times per day)?   3. Are you having a reaction (difficulty breathing--STAT)?   4. What is your medication issue? Received a call from pt's wife stating pt doesn't have his medication and he is completely out  [employer] and they said that pt cannot pick it up till the 06/22/22 due to insurance issues, pt wants to know what he should do because he is out.

## 2022-06-20 NOTE — Progress Notes (Signed)
RN spoke with patient to assess any needs after recently completing his radiation treatment for his stage T1c adenocarcinoma of the prostate with Gleason score of 4+3, and PSA of 6.42.   Patient is scheduled for his post treatment nurse call 07/08/2022, and currently does not have any follow up's at [facility].  RN encouraged patient to call over to [facility] to get scheduled for 3 month follow up with Dr. Junious Silk.   Patient verbalized understanding and agreement.    No further needs at this time.

## 2022-07-03 NOTE — Progress Notes (Signed)
                                                                                                                                                             Patient Name: John Love MRN: [id number] DOB: 13-Jul-1950 Referring Physician: Mena Goes MATTHEW (Profile Not Attached) Date of Service: 05/29/2022 [facility], [place]                                                        End Of Treatment Note  Diagnoses: C61-Malignant neoplasm of prostate  Cancer Staging: 72 y.o. gentleman with Stage T1c adenocarcinoma of the prostate with Gleason score of 4+3, and PSA of 6.42.   Intent: Curative  Radiation Treatment Dates: 04/21/2022 through 05/29/2022 Site Technique Total Dose (Gy) Dose per Fx (Gy) Completed Fx Beam Energies  Prostate: Prostate IMRT 70/70 2.5 28/28 6X   Narrative: The patient tolerated radiation therapy relatively well. He did report some increased nocturia,mild dysuria and modest fatigue. His LUTS were managed with taking Flomax daily as prescribed.  Plan: The patient will receive a call in about one month from the radiation oncology department. He will continue follow up with his urologist, Dr. Mena Goes as well.  ------------------------------------------------   Margaretmary Dys, MD [facility]  Radiation Oncology Direct Dial: [phone]  Fax: [phone] [email or web address]  Skype  LinkedIn

## 2022-07-07 NOTE — Progress Notes (Signed)
  Radiation Oncology         [phone] ________________________________  Name: John Love MRN: [id number]  Date of Service: 07/08/2022  DOB: 02/20/1951  Post Treatment Telephone Note  Diagnosis:  73 y.o. gentleman with Stage T1c adenocarcinoma of the prostate with Gleason score of 4+3, and PSA of 6.42.   Intent: Curative  Radiation Treatment Dates: 04/21/2022 through 05/29/2022 Site Technique Total Dose (Gy) Dose per Fx (Gy) Completed Fx Beam Energies  Prostate: Prostate       (as documented in provider EOT note)   Pre Treatment IPSS Score: 20 (as documented in the provider consult note)   The patient was not available for call today. No voicemail option.   Patient does not currently have any scheduled follow up with his urologist to his knowledge so he was advised to call [facility] to schedule his post-treatment follow up with Dr. Mena Goes for ongoing surveillance. He was counseled that PSA levels will be drawn in the urology office, and was reassured that additional time is expected to improve bowel and bladder symptoms. He was encouraged to call back with concerns or questions regarding radiation.    [name], LPN

## 2022-07-08 ENCOUNTER — Ambulatory Visit
Admission: RE | Admit: 2022-07-08 | Discharge: 2022-07-08 | Disposition: A | Discharge status: Home / Self Care | Payer: Medicare Other | Source: Ambulatory Visit | Attending: Radiation Oncology | Admitting: Radiation Oncology

## 2022-10-10 ENCOUNTER — Other Ambulatory Visit: Payer: Self-pay | Admitting: Cardiology

## 2022-11-20 ENCOUNTER — Telehealth: Payer: Self-pay | Admitting: Cardiology

## 2022-11-20 ENCOUNTER — Encounter (HOSPITAL_COMMUNITY): Payer: Self-pay

## 2022-11-20 ENCOUNTER — Other Ambulatory Visit: Payer: Self-pay | Admitting: Urology

## 2022-11-20 NOTE — Telephone Encounter (Signed)
   Pre-operative Risk Assessment    Patient Name: John Love  DOB: 1950-09-23 MRN: [id number]      Request for Surgical Clearance    Procedure:   Transurethral resection of prostrate  Date of Surgery:  Clearance 12/19/22                                 Surgeon:  Dr.  Mena Goes Surgeon's Group or Practice Name: [facility]  Phone number:  [phone]  Fax number:  [phone]   Type of Clearance Requested:   - Medical  - Pharmacy:  Hold Apixaban (Eliquis)     Type of Anesthesia:  General    Additional requests/questions:   Caller stated they will need Eliquis for 2 days prior to surgery.    Signed, Annetta Maw   11/20/2022, 2:57 PM

## 2022-11-20 NOTE — Telephone Encounter (Signed)
Patient with diagnosis of atrial fibrillation on Eliquis for anticoagulation.    Procedure:   Transurethral resection of prostrate   Date of Surgery:  Clearance 12/19/22   CHA2DS2-VASc Score = 3   This indicates a 3.2% annual risk of stroke. The patient's score is based upon: CHF History: 0 HTN History: 1 Diabetes History: 0 Stroke History: 0 Vascular Disease History: 1 Age Score: 1 Gender Score: 0  CrCl 126 Platelet count 193  Per office protocol, patient can hold Eliquis for 2 days prior to procedure.   Patient will not need bridging with Lovenox (enoxaparin) around procedure.  **This guidance is not considered finalized until pre-operative APP has relayed final recommendations.**'

## 2022-11-21 NOTE — Telephone Encounter (Signed)
   Name: John Love  DOB: Nov 11, 1950  MRN: [id number]  Primary Cardiologist: Bryan Lemma, MD  Chart reviewed as part of pre-operative protocol coverage. Because of John Love's past medical history and time since last visit, he will require a follow-up telephone visit in order to better assess preoperative cardiovascular risk.  Pre-op covering staff: - Please schedule appointment and call patient to inform them. If patient already had an upcoming appointment within acceptable timeframe, please add "pre-op clearance" to the appointment notes so provider is aware. - Please contact requesting surgeon's office via preferred method (i.e, phone, fax) to inform them of need for appointment prior to surgery.  Per office protocol, patient can hold Eliquis for 2 days prior to procedure.   Patient will not need bridging with Lovenox (enoxaparin) around procedure.  Sharlene Dory, PA-C  11/21/2022, 7:38 AM

## 2022-11-21 NOTE — Telephone Encounter (Signed)
PT HAS AN APPT SCHEDULED WITH dR. hARDING 6/11. I UPDATED APPT NOTES AND WILL FORWARD TO PROVIDER.

## 2022-11-25 ENCOUNTER — Ambulatory Visit: Payer: Medicare Other | Attending: Cardiology | Admitting: Cardiology

## 2022-11-25 ENCOUNTER — Encounter: Payer: Self-pay | Admitting: Cardiology

## 2022-11-25 VITALS — BP 122/76 | HR 73 | Ht 74.0 in | Wt 223.6 lb

## 2022-11-25 DIAGNOSIS — Z716 Tobacco abuse counseling: Secondary | ICD-10-CM

## 2022-11-25 DIAGNOSIS — I469 Cardiac arrest, cause unspecified: Secondary | ICD-10-CM | POA: Diagnosis not present

## 2022-11-25 DIAGNOSIS — I480 Paroxysmal atrial fibrillation: Secondary | ICD-10-CM

## 2022-11-25 DIAGNOSIS — F17200 Nicotine dependence, unspecified, uncomplicated: Secondary | ICD-10-CM

## 2022-11-25 DIAGNOSIS — N1832 Chronic kidney disease, stage 3b: Secondary | ICD-10-CM

## 2022-11-25 DIAGNOSIS — I25118 Atherosclerotic heart disease of native coronary artery with other forms of angina pectoris: Secondary | ICD-10-CM

## 2022-11-25 DIAGNOSIS — I2510 Atherosclerotic heart disease of native coronary artery without angina pectoris: Secondary | ICD-10-CM | POA: Diagnosis not present

## 2022-11-25 DIAGNOSIS — Z01810 Encounter for preprocedural cardiovascular examination: Secondary | ICD-10-CM

## 2022-11-25 DIAGNOSIS — Z7901 Long term (current) use of anticoagulants: Secondary | ICD-10-CM

## 2022-11-25 DIAGNOSIS — I10 Essential (primary) hypertension: Secondary | ICD-10-CM

## 2022-11-25 DIAGNOSIS — Z8719 Personal history of other diseases of the digestive system: Secondary | ICD-10-CM

## 2022-11-25 DIAGNOSIS — Z9861 Coronary angioplasty status: Secondary | ICD-10-CM

## 2022-11-25 DIAGNOSIS — E785 Hyperlipidemia, unspecified: Secondary | ICD-10-CM

## 2022-11-25 DIAGNOSIS — R7303 Prediabetes: Secondary | ICD-10-CM

## 2022-11-25 DIAGNOSIS — I4901 Ventricular fibrillation: Secondary | ICD-10-CM

## 2022-11-25 NOTE — Patient Instructions (Addendum)
Medication Instructions:  NONE *If you need a refill on your cardiac medications before your next appointment, please call your pharmacy*   Lab Work: WILL DO LIPID PANEL AND A1C   If you have labs (blood work) drawn today and your tests are completely normal, you will receive your results only by: MyChart Message (if you have MyChart) OR A paper copy in the mail If you have any lab test that is abnormal or we need to change your treatment, we will call you to review the results.   Testing/Procedures: NONE   Follow-Up: At [facility], you and your health needs are our priority.  As part of our continuing mission to provide you with exceptional heart care, we have created designated Provider Care Teams.  These Care Teams include your primary Cardiologist (physician) and Advanced Practice Providers (APPs -  Physician Assistants and Nurse Practitioners) who all work together to provide you with the care you need, when you need it.  Your next appointment:   6 month(s)  Provider:   Joni Reining, DNP, ANP    Then, Bryan Lemma, MD will plan to see you again in 1 year(s).     CARDIAC CLEARANCE FOR UPCOMING PROCEDURE.

## 2022-11-25 NOTE — Progress Notes (Unsigned)
Primary Care Provider: Patient, No Pcp Per [facility] Cardiologist: Bryan Lemma, MD Electrophysiologist: None  Clinic Note: No chief complaint on file.   ===================================  ASSESSMENT/PLAN   Problem List Items Addressed This Visit       Cardiology Problems   PAF (paroxysmal atrial fibrillation) (HCC); CHA2DS2VASC 3; Eliquis (Chronic)   Hyperlipidemia with target LDL less than 70 (Chronic)   Essential hypertension (Chronic)   Coronary artery disease of native artery of native heart with stable angina pectoris (HCC) (Chronic)   Cardiac arrest with ventricular fibrillation (HCC) -> 20 minutes CPR with ROSC (Chronic)   CAD S/P 4 V PCI - Primary (Chronic)     Other   Current every day smoker (Chronic)    ===================================  HPI:    John Love is a 72 y.o. male with a PMH below who presents today for 6 month f/u.  08/09/2019: V Fib arrest during 15 min observation period after 2nd COVID Vaccine => apparent syncope - noted to be pulseless -> CPR (confirmed VF) -> ROSC => Lateral STEMI ->  Cath: 3 V CAD: prox RCA 80%, Ost-prox RCA 50%; D1 95% with prox-mid LAD 75%, & mid-distal LCx 80%-OM3 100% => DES PCI to m-dLCx & DES x 2 to RCA -> d/c on 08/19/2019 Echo EF 55-60%. No RWMA.  08/29/2019: Staged Atherectomy-Bifurcation PCI of LAD-Diag 3 Stentrs in LAD & 1 in Ost Diag. 01/28/2020: [facility] => hemoptysis; CT Chest showed lung mass - 2 rounds of Abx => Seen by Dr. Arabella Merles 02/02/2020 -> CT showed RLL mass c/w malignancy => PET on 02/22/20; Brilinta held for Bx Bronchoscopy 8/26 -> Dx SSCa in RLL -C lung cancer treatment below. Squamous Cell Lung Cancer (Right Lower Lobe -> T3N2Mo - 02/09/2020)  04/06/2020 -NEW ONSET A-FIB> returned to [facility] - noted to be in Afib => unfortunately, he was initiated  on Eliquis, aspirin and Brilinta Nov 16-25/21: Admitted with GI bleed => aspirin Plavix restarted with plan to restart  Eliquis after 1 month and stop aspirin.  Amlodipine was discontinued due to hypotension.  John Love was last seen on ***  Recent Hospitalizations: ***  Reviewed  CV studies:    The following studies were reviewed today: (if available, images/films reviewed: From Epic Chart or Care Everywhere) ***:  Interval History:   John Love   CV Review of Symptoms (Summary): no chest pain or dyspnea on exertion negative for - edema, irregular heartbeat, orthopnea, palpitations, paroxysmal nocturnal dyspnea, rapid heart rate, shortness of breath, or TIA/amaurosis fugax, TIA/CV/amaurosis fugax, claudication  REVIEWED OF SYSTEMS   ROS  I have reviewed and (if needed) personally updated the John Love's problem list, medications, allergies, past medical and surgical history, social and family history.   PAST MEDICAL HISTORY   Past Medical History:  Diagnosis Date   Anticoagulated    eliquis --- managed by cardiology   Benign localized prostatic hyperplasia with lower urinary tract symptoms (LUTS)    CAD S/P 4V PCI 08/10/2019   08/09/19: m-dLCx 80%--OM2 100% @ small branch take-off. --> DES PCI: 2.25 mm x 28 mm Synergy DES (2.75 mm);  pRCA 50%-80% tandem lesions --> DES PCI: 3.5 mm x 24 mm Synergy DES (4.1 mm) 08/29/19: Staged Bifurcation PCI of p-dLAD@D1  with CSI atherectomy and scoring low PTA of ostial D1-  (4 total stents, initial stent from LAD into D1 (Synergy 2.75 x 16-2.9 mm, with 3 overlapping stents in L   ED (erectile  dysfunction)    Full dentures    H/O: upper GI bleed 05/01/2020   Admitted with acute blood loss anemia-4+ units PRBC.  Provocative bleeding scan-small bowel AVMs s/p Cauterization 05/08/2020   History of anemia    History of cancer chemotherapy    06-11-2020  to 06-07-2021 lung cancer   History of external beam radiation therapy    03-21-2020  to  05-11-2020  RLL   History of ST elevation myocardial infarction (STEMI) 08/09/2019   History of sudden cardiac  arrest successfully resuscitated 08/09/2019   15 minutes following second COVID-19 vaccination -> vasovagal episode during the hypotension followed by VF cardiac arrest >20 min@ [place] [facility]; found to have 4 vessel CAD on cath with culprit 100% OM 3--now status post four-vessel PCI   History of sustained ventricular fibrillation 08/09/2019   In setting of acute MI   Malignant neoplasm prostate [facility]) 12/2021   urologist--- dr eskridge/   radiation onologist--- dr Kathrynn Running;   dx 07/ 2023   Multiple vessel coronary artery disease 08/10/2019   Heart Cath -PCI 08/09/2019:  CULPRIT LESION: Mid Cx to Dist Cx lesion is 80% stenosed. 3rd Mrg lesion is 100% stenosed at small branch take-off. DES PCI: 2.25 mm x 28 mm Synergy DES (proximal postdilated 2.75 mm.  Post intervention, there is a 0% residual stenosis.  Small side branch was occluded Ost RCA to Prox RCA lesion is 50% stenosed.Prox RCA lesion is 80% stenosed.  DES PCI: 3.5 mm x 24 mm Sy   Paroxysmal atrial fibrillation (HCC) 04/06/2020   cardiologist---- dr Herbie [place] Presented to [facility]. fib RVR  04-07-2020  s/p DCCV   Pre-diabetes    S/P drug eluting coronary stent placement 08/09/2019   total 6 stents involving 4V   Squamous cell carcinoma of bronchus in right lower lobe (HCC) 01/2020   [facility] by [facility]  (Dr Ottis Stain) CT scan 01/28/2020-status post bronchoscopy with biopsy -> pathology T3, N2, M0 poorly differentiated; MRI Brain - NO intracranial Mets.;   completed chemo 06-07-2021 and radiation 05-11-2010    PAST SURGICAL HISTORY   Past Surgical History:  Procedure Laterality Date   COLONOSCOPY WITH ESOPHAGOGASTRODUODENOSCOPY (EGD)  05/02/2020   [facility]: No evidence of acute bleeding, colon polyps noted.   CORONARY ATHERECTOMY N/A 08/29/2019   Procedure: CORONARY ATHERECTOMY;  Surgeon: Marykay Lex, MD;  Location: [facility] INVASIVE CV LAB;  Service: Cardiovascular; SCORING BALLOON ANGIOPLASTY- ostial D1, CSI  ATHERECTOMY p-mLAD -> followed by bifurcation stenting   CORONARY STENT INTERVENTION N/A 08/09/2019   Procedure: CORONARY STENT INTERVENTION;  Surgeon: Runell Gess, MD::  2V PCI: Culprit lesion ~100% OM3 (with 80% main LCx) --> DES PCI covering both lesions-Synergy DES 2.25 mm x 28 mm, postdilated to 2.75 mm; LESION #2 proximal RCA 50% followed by 80% --> DES PCI covering both-Synergy DES 3.5 mm x 24 mm   CORONARY STENT INTERVENTION N/A 08/29/2019   Procedure: CORONARY STENT INTERVENTION;  Surgeon: Marykay Lex, MD:: Bifurcation DES PCI, Reverse Culotte Technique W/ KB (4 total SYNERGY DES stents, 1st (2.75X16 - 2.9 MM) - pLAD-ostD1 & 3 overlapping in LAD (3.0 x 38, 3.0 x 16, 2.75 x16 - tapered POT postdilation 3.6-3.1 & apical LAD PTA only.   CYSTOSCOPY N/A 08/18/2019   Procedure: CYSTOSCOPY CLOT EVACUATION FULGURATION 0.5-2 CM.;  Surgeon: Jerilee Field, MD;  Location: [facility] OR;  Service: Urology;  Laterality: N/A;   ESOPHAGOGASTRODUODENOSCOPY W/ BANDING  05/08/2020   [facility]: identified Small Bowel  AVMs - > Rx with Cauterization   GOLD SEED IMPLANT N/A 04/04/2022   Procedure: GOLD SEED IMPLANT;  Surgeon: Despina Arias, MD;  Location: [facility];  Service: Urology;  Laterality: N/A;   LEFT HEART CATH AND CORONARY ANGIOGRAPHY N/A 08/09/2019   Procedure: RIGHT & LEFT HEART CATH AND CORONARY ANGIOGRAPHY;  Surgeon: Runell Gess, MD;  Location: [facility] INVASIVE CV LAB:: post-arrest : 80% LCx-100% OM3 (DES PCI), pRCA tandem 50 & 80% (DES PCI), Bifurcation LAD (75% calcified)-ostial D1 95%.  LVEDP 11 mmHg, PCWP 15 mmHg.    MESENTERIC ARTERIOGRAM  05/07/2020   [facility]: No evidence of bleed or target ASM for treatment (Presented with ABLA - UGIB)    SPACE OAR INSTILLATION N/A 04/04/2022   Procedure: SPACE OAR INSTILLATION;  Surgeon: Despina Arias, MD;  Location: [facility];  Service: Urology;  Laterality: N/A;   TRANSTHORACIC ECHOCARDIOGRAM  08/10/2019    Post  cardiac arrest: EF 55 to 60%.  No R WMA.  GR 1 DD.  Normal RV.  Normal valves.   TRANSTHORACIC ECHOCARDIOGRAM  02/23/2020   normal LV systolic function with an EF of 55-60%. The left atrium was mildly dilated.   Trivial pericardial effusion.   VIDEO BRONCHOSCOPY WITH RADIAL ENDOBRONCHIAL ULTRASOUND  02/09/2020   [facility]: Dr. Arabella Merles => for RLL Lung mass: Prominent adenopathy in the paratracheal (2R-4R) station, subcarinal station(7), and 11 L.  Complex adenopathy/Mass in R [place] (unable to determine nodes versus direct tumor invasion). EBUS-transBronch Needle Asp @ 11L, 7 & 2R LNs-> No evidence of Malignancy; TO of Bronchus Intermedius w/ both endobronchial tumor & extrinsic compression. Bx taken.        MEDICATIONS/ALLERGIES   Current Meds  Medication Sig   ascorbic acid (VITAMIN C) 500 MG tablet 1 tab(s)   atorvastatin (LIPITOR) 40 MG tablet TAKE 1 TABLET(40 MG) BY MOUTH DAILY (John Love taking differently: Take 40 mg by mouth at bedtime.)   ELIQUIS 5 MG TABS tablet TAKE 1 TABLET(5 MG) BY MOUTH TWICE DAILY   finasteride (PROSCAR) 5 MG tablet Take 5 mg by mouth daily.   metoprolol tartrate (LOPRESSOR) 50 MG tablet TAKE 1 TABLET(50 MG) BY MOUTH TWICE DAILY   Multiple Vitamin (MULTIVITAMIN ADULT PO) Take 2 tablets by mouth daily.    No Known Allergies  SOCIAL HISTORY/FAMILY HISTORY   Reviewed in Epic:  Pertinent findings:  Social History   Tobacco Use   Smoking status: Every Day    Packs/day: 0.75    Years: 49.00    Additional pack years: 0.00    Total pack years: 36.75    Types: Cigarettes   Smokeless tobacco: Never   Tobacco comments:    Pt stopped 02/ 2021 after cardiac arrest w/ cardiac intervention but restarted back (started smoking age 19)  Vaping Use   Vaping Use: Never used  Substance Use Topics   Alcohol use: Yes    Alcohol/week: 28.0 - 35.0 standard drinks of alcohol    Types: 28 - 35 Cans of beer per week    Comment: 03-31-2022  pt stated drinks 3-4 beer  daily (12 oz each)   Drug use: Never   Social History   Social History Narrative   Not on file    OBJCTIVE -PE, EKG, labs   Wt Readings from Last 3 Encounters:  11/25/22 223 lb 9.6 oz (101.4 kg)  05/26/22 251 lb 3.2 oz (113.9 kg)  04/04/22 254 lb 4.8 oz (115.3 kg)    Physical Exam:  BP 122/76   Pulse 73   Ht 6\' 2"  (1.88 m)   Wt 223 lb 9.6 oz (101.4 kg)   BMI 28.71 kg/m  Physical Exam   Adult ECG Report  Rate: *** ;  Rhythm: {rhythm:17366};   Narrative Interpretation: ***  Recent Labs:  due for recheck  Lab Results  Component Value Date   CHOL 96 (L) 11/22/2021   HDL 33 (L) 11/22/2021   LDLCALC 44 11/22/2021   TRIG 101 11/22/2021   CHOLHDL 2.9 11/22/2021   Lab Results  Component Value Date   CREATININE 0.90 04/04/2022   BUN 15 04/04/2022   NA 142 04/04/2022   K 4.3 04/04/2022   CL 104 04/04/2022   CO2 26 11/22/2021      Latest Ref Rng & Units 04/04/2022    6:55 AM 11/22/2021    9:16 AM 12/08/2019    9:04 AM  CBC  WBC 3.4 - 10.8 x10E3/uL  7.5  10.0   Hemoglobin 13.0 - 17.0 g/dL 16.1  09.6  04.5   Hematocrit 39.0 - 52.0 % 44.0  43.2  39.5   Platelets 150 - 450 x10E3/uL  228  457     Lab Results  Component Value Date   HGBA1C 6.1 (H) 11/22/2021   No results found for: "TSH"  ================================================== I spent a total of ***minutes with the John Love spent in direct John Love consultation.  Additional time spent with chart review  / charting (studies, outside notes, etc): *** min Total Time: *** min  Current medicines are reviewed at length with the John Love today.  (+/- concerns) ***  Notice: This dictation was prepared with Dragon dictation along with smart phrase technology. Any transcriptional errors that result from this process are unintentional and may not be corrected upon review.  Studies Ordered:   No orders of the defined types were placed in this encounter.  No orders of the defined types were placed in this  encounter.   John Love Instructions / Medication Changes & Studies & Tests Ordered   John Love Instructions  Medication Instructions:  *** *If you need a refill on your cardiac medications before your next appointment, please call your pharmacy*   Lab Work: *** If you have labs (blood work) drawn today and your tests are completely normal, you will receive your results only by: MyChart Message (if you have MyChart) OR A paper copy in the mail If you have any lab test that is abnormal or we need to change your treatment, we will call you to review the results.   Testing/Procedures: ***   Follow-Up: At [facility], you and your health needs are our priority.  As part of our continuing mission to provide you with exceptional heart care, we have created designated Provider Care Teams.  These Care Teams include your primary Cardiologist (physician) and Advanced Practice Providers (APPs -  Physician Assistants and Nurse Practitioners) who all work together to provide you with the care you need, when you need it.  We recommend signing up for the John Love portal called "MyChart".  Sign up information is provided on this After Visit Summary.  MyChart is used to connect with patients for Virtual Visits (Telemedicine).  Patients are able to view lab/test results, encounter notes, upcoming appointments, etc.  Non-urgent messages can be sent to your provider as well.   To learn more about what you can do with MyChart, go to [email or web address].    Your next appointment:   {numbers 1-12:10294} {Time; day/wk/mo/yr(s):9076}  Provider:   {  Providers/Teams        :[id number]} {If Card or EP not listed click to update   DO NOT delete brackets or number around this link :1}   Other Instructions ***      Marykay Lex, MD, MS Bryan Lemma, M.D., M.S. Interventional Cardiologist  [facility]  Pager # [phone] Phone # [phone] [address] [place], [place] [address]   Thank you for choosing [facility] [place]!!

## 2022-11-26 ENCOUNTER — Encounter: Payer: Self-pay | Admitting: Cardiology

## 2022-11-26 NOTE — Assessment & Plan Note (Signed)
Sizable GI bleed requiring transfusion in the past.  Has not had any significant bleeding but should be due for labs to assess hemoglobin level.

## 2022-11-26 NOTE — Assessment & Plan Note (Signed)
2 iterations of being evaluated with no active chest pain symptoms.  He is actually doing markedly well now.  He is only on statin, beta-blocker and Eliquis.  Trying to stay active, may be more limited by musculoskeletal issues than other issues.  No longer on Thienopyridine antiplatelet agent or aspirin because of Eliquis.

## 2022-11-26 NOTE — Assessment & Plan Note (Addendum)
Unfortunately, he did not tolerate Chantix.  He is going to try to use Nicorette gum, but this wants to get through his surgery.  Has cut down regardless, and trying to avoid being a mediator yet.

## 2022-11-26 NOTE — Assessment & Plan Note (Signed)
Talked about smoking cessation, he did not do well with Chantix.  He is thinking about maybe trying again with patches or Nicorette gum once he gets a prostate surgery done.  Encouraged him to continue to work toward this goal and continue to cut back to make it easier.

## 2022-11-26 NOTE — Assessment & Plan Note (Signed)
Early bystander PCI essentially stayed his life.  My suspicion is that he had global ischemia related to dehydration and vasovagal syncope after his COVID-19 shot.  He became ischemic because of multivessel disease.  Now status post CABG.  Stable with no active angina or heart failure symptoms.  No arrhythmia symptoms.  He is on a stable dose of beta-blocker and normal EF, we will push too far.

## 2022-11-26 NOTE — Assessment & Plan Note (Signed)
He had acute on chronic renal failure back in the setting of cardiac arrest.  Also had significant contrast related concerns during staged PCI.  Most recent creatinine was 2.0 back in March.  Potassium stable at 4.8.  He is not on ACE inhibitor or, ARB/spironolactone or diuretic.  He

## 2022-11-26 NOTE — Assessment & Plan Note (Signed)
Does not appear to have any breakthrough episodes of A-fib since converting to sinus rhythm m a couple of years ago.  On stable baseline dose of Lopressor as well as prophylaxis with DOAC.

## 2022-11-26 NOTE — Assessment & Plan Note (Signed)
He is now over 3 years out from his cardiac arrest and staged three-vessel PCI.  He had A-fib in the setting of his cardiac arrest as far as I can tell has not had any breakthrough spells.  For now he remains on Eliquis for prophylaxis. This patients CHA2DS2-VASc Score and unadjusted Ischemic Stroke Rate (% per year) is equal to 3.2 % stroke rate/year from a score of 3  Above score calculated as 1 point each if present [CHF, HTN, DM, Vascular=MI/PAD/Aortic Plaque, Age if 65-74, or Male] Above score calculated as 2 points each if present [Age > 75, or Stroke/TIA/TE]   Will continue Eliquis 5 mg twice daily. => Okay to hold Eliquis for 48 to 72 hours preop for surgical procedure without bridging.  Can hold additional 1 day postop if necessary.

## 2022-11-26 NOTE — Assessment & Plan Note (Signed)
For the most part he remains in sinus rhythm and doing well without any active symptoms.  He is pretty active.  As part of his upcoming operation he will need to hold his Eliquis.  Recommendation would be to hold Eliquis 2 to 3 days preop for surgeries or procedures.  Can then hold at least 1 to 2 days postop until safe.  Does not need bridging. Continue beta-blocker.   He is able to achieve 4 METS without any active anginal symptoms or heart failure.  He does have increased risk because of renal insufficiency, however the surgery is relatively low risk from a cardiovascular standpoint therefore would be considered class II risk-okay 6% risk for adverse cardiac events based simply on his renal insufficiency.  He does have history ischemic CAD, but has been revascularized with no active symptoms.  He has history of A-fib but no recurrent symptoms.  Would recommend proceeding with his prostate surgery without any further cardiac evaluation.  Okay to hold Eliquis as noted.

## 2022-11-26 NOTE — Assessment & Plan Note (Signed)
Blood pressure well-controlled today on twice daily Lopressor.

## 2022-11-26 NOTE — Assessment & Plan Note (Addendum)
Most recent labs showed LDL 44 in June 2023.  Should be due for labs recheck now.  He is on stable dose of atorvastatin and was well-controlled last year. We will add a fasting lipid panel and A1c to be drawn as part of his preop testing  on June 24.

## 2022-12-04 NOTE — Patient Instructions (Signed)
DUE TO COVID-19 ONLY TWO VISITORS  (aged 71 and older)  ARE ALLOWED TO COME WITH YOU AND STAY IN THE WAITING ROOM ONLY DURING PRE OP AND PROCEDURE.   **NO VISITORS ARE ALLOWED IN THE SHORT STAY AREA OR RECOVERY ROOM!!**  IF YOU WILL BE ADMITTED INTO THE HOSPITAL YOU ARE ALLOWED ONLY FOUR SUPPORT PEOPLE DURING VISITATION HOURS ONLY (7 AM -8PM)   The support person(s) must pass our screening, gel in and out, and wear a mask at all times, including in the patient's room. Patients must also wear a mask when staff or their support person are in the room. Visitors GUEST BADGE MUST BE WORN VISIBLY  One adult visitor may remain with you overnight and MUST be in the room by 8 P.M.     Your procedure is scheduled on: 12/19/22   Report to [facility] Main Entrance    Report to admitting at: 5:15 AM   Call this number if you have problems the morning of surgery [phone]   Do not eat food :After Midnight.  FOLLOW BOWEL PREP AND ANY ADDITIONAL PRE OP INSTRUCTIONS YOU RECEIVED FROM YOUR SURGEON'S OFFICE!!!   Oral Hygiene is also important to reduce your risk of infection.                                    Remember - BRUSH YOUR TEETH THE MORNING OF SURGERY WITH YOUR REGULAR TOOTHPASTE  DENTURES WILL BE REMOVED PRIOR TO SURGERY PLEASE DO NOT APPLY "Poly grip" OR ADHESIVES!!!   Do NOT smoke after Midnight   Take these medicines the morning of surgery with A SIP OF WATER: metoprolol,finasteride                              You may not have any metal on your body including hair pins, jewelry, and body piercing             Do not wear lotions, powders, perfumes/cologne, or deodorant              Men may shave face and neck.   Do not bring valuables to the hospital. [facility] IS NOT             RESPONSIBLE   FOR VALUABLES.   Contacts, glasses, or bridgework may not be worn into surgery.   Bring small overnight bag day of surgery.   DO NOT BRING YOUR HOME MEDICATIONS TO THE  HOSPITAL. PHARMACY WILL DISPENSE MEDICATIONS LISTED ON YOUR MEDICATION LIST TO YOU DURING YOUR ADMISSION IN THE HOSPITAL!    Patients discharged on the day of surgery will not be allowed to drive home.  Someone NEEDS to stay with you for the first 24 hours after anesthesia.   Special Instructions: Bring a copy of your healthcare power of attorney and living will documents         the day of surgery if you haven't scanned them before.              Please read over the following fact sheets you were given: IF YOU HAVE QUESTIONS ABOUT YOUR PRE-OP INSTRUCTIONS PLEASE CALL [phone]    [facility] - Preparing for Surgery Before surgery, you can play an important role.  Because skin is not sterile, your skin needs to be as free of germs as possible.  You can reduce  the number of germs on your skin by washing with CHG (chlorahexidine gluconate) soap before surgery.  CHG is an antiseptic cleaner which kills germs and bonds with the skin to continue killing germs even after washing. Please DO NOT use if you have an allergy to CHG or antibacterial soaps.  If your skin becomes reddened/irritated stop using the CHG and inform your nurse when you arrive at Short Stay. Do not shave (including legs and underarms) for at least 48 hours prior to the first CHG shower.  You may shave your face/neck. Please follow these instructions carefully:  1.  Shower with CHG Soap the night before surgery and the  morning of Surgery.  2.  If you choose to wash your hair, wash your hair first as usual with your  normal  shampoo.  3.  After you shampoo, rinse your hair and body thoroughly to remove the  shampoo.                           4.  Use CHG as you would any other liquid soap.  You can apply chg directly  to the skin and wash                       Gently with a scrungie or clean washcloth.  5.  Apply the CHG Soap to your body ONLY FROM THE NECK DOWN.   Do not use on face/ open                           Wound or open  sores. Avoid contact with eyes, ears mouth and genitals (private parts).                       Wash face,  Genitals (private parts) with your normal soap.             6.  Wash thoroughly, paying special attention to the area where your surgery  will be performed.  7.  Thoroughly rinse your body with warm water from the neck down.  8.  DO NOT shower/wash with your normal soap after using and rinsing off  the CHG Soap.                9.  Pat yourself dry with a clean towel.            10.  Wear clean pajamas.            11.  Place clean sheets on your bed the night of your first shower and do not  sleep with pets. Day of Surgery : Do not apply any lotions/deodorants the morning of surgery.  Please wear clean clothes to the hospital/surgery center.  FAILURE TO FOLLOW THESE INSTRUCTIONS MAY RESULT IN THE CANCELLATION OF YOUR SURGERY PATIENT SIGNATURE_________________________________  NURSE SIGNATURE__________________________________  ________________________________________________________________________

## 2022-12-08 ENCOUNTER — Encounter (HOSPITAL_COMMUNITY): Payer: Self-pay

## 2022-12-08 ENCOUNTER — Other Ambulatory Visit: Payer: Self-pay

## 2022-12-08 ENCOUNTER — Encounter (HOSPITAL_COMMUNITY)
Admission: RE | Admit: 2022-12-08 | Discharge: 2022-12-08 | Disposition: A | Discharge status: Home / Self Care | Payer: Medicare Other | Source: Ambulatory Visit | Attending: Urology | Admitting: Urology

## 2022-12-08 VITALS — BP 134/86 | HR 70 | Temp 98.0°F | Ht 74.0 in | Wt 220.0 lb

## 2022-12-08 DIAGNOSIS — I2510 Atherosclerotic heart disease of native coronary artery without angina pectoris: Secondary | ICD-10-CM | POA: Diagnosis not present

## 2022-12-08 DIAGNOSIS — Z01812 Encounter for preprocedural laboratory examination: Secondary | ICD-10-CM | POA: Diagnosis present

## 2022-12-08 DIAGNOSIS — I10 Essential (primary) hypertension: Secondary | ICD-10-CM

## 2022-12-08 DIAGNOSIS — N401 Enlarged prostate with lower urinary tract symptoms: Secondary | ICD-10-CM | POA: Diagnosis not present

## 2022-12-08 DIAGNOSIS — R338 Other retention of urine: Secondary | ICD-10-CM | POA: Diagnosis not present

## 2022-12-08 DIAGNOSIS — N189 Chronic kidney disease, unspecified: Secondary | ICD-10-CM | POA: Diagnosis not present

## 2022-12-08 DIAGNOSIS — I480 Paroxysmal atrial fibrillation: Secondary | ICD-10-CM | POA: Diagnosis not present

## 2022-12-08 HISTORY — DX: Acute myocardial infarction, unspecified: I21.9

## 2022-12-08 HISTORY — DX: Chronic kidney disease, unspecified: N18.9

## 2022-12-08 HISTORY — DX: Cardiac arrhythmia, unspecified: I49.9

## 2022-12-08 LAB — BASIC METABOLIC PANEL
Anion gap: 8 (ref 5–15)
BUN: 15 mg/dL (ref 8–23)
CO2: 25 mmol/L (ref 22–32)
Calcium: 8.9 mg/dL (ref 8.9–10.3)
Chloride: 104 mmol/L (ref 98–111)
Creatinine, Ser: 1.24 mg/dL (ref 0.61–1.24)
GFR, Estimated: >60 mL/min (ref >60)
Glucose, Bld: 92 mg/dL (ref 70–99)
Potassium: 4.5 mmol/L (ref 3.5–5.1)
Sodium: 137 mmol/L (ref 135–145)

## 2022-12-08 LAB — CBC
HCT: 45.5 % (ref 39.0–52.0)
Hemoglobin: 14.2 g/dL (ref 13.0–17.0)
MCH: 24.6 pg — ABNORMAL LOW (ref 26.0–34.0)
MCHC: 31.2 g/dL (ref 30.0–36.0)
MCV: 78.9 fL — ABNORMAL LOW (ref 80.0–100.0)
Platelets: 251 10*3/uL (ref 150–400)
RBC: 5.77 MIL/uL (ref 4.22–5.81)
RDW: 16.9 % — ABNORMAL HIGH (ref 11.5–15.5)
WBC: 8.7 10*3/uL (ref 4.0–10.5)
nRBC: 0 % (ref 0.0–0.2)

## 2022-12-08 NOTE — Progress Notes (Signed)
For Short Stay: COVID SWAB appointment date:  Bowel Prep reminder:   For Anesthesia: PCP - Dr. Jerrell Mylar: [facility]. Cardiologist - Dr. Bryan Lemma. Clearance: 11/25/22  Chest x-ray -  EKG - 11/25/22 Stress Test -  ECHO - 08/10/19 Cardiac Cath - 08/29/19 Pacemaker/ICD device last checked: Pacemaker orders received: Device Rep notified:  Spinal Cord Stimulator: N/A  Sleep Study - N/A CPAP -   Fasting Blood Sugar - N/A Checks Blood Sugar ___0__ times a day Date and result of last Hgb A1c- 6.1: 11/22/21  Last dose of GLP1 agonist- N/A GLP1 instructions:   Last dose of SGLT-2 inhibitors- N/A SGLT-2 instructions:   Blood Thinner Instructions:Eliquis will be hold after 12/16/22 Aspirin Instructions: Last Dose:  Activity level: Can go up a flight of stairs and activities of daily living without stopping and without chest pain and/or shortness of breath   Able to exercise without chest pain and/or shortness of breath  Anesthesia review: Hx: Afib,Cardiac arrest,Pre-DIA,CAD,Smoker,CKD III.  Patient denies shortness of breath, fever, cough and chest pain at PAT appointment   Patient verbalized understanding of instructions that were given to them at the PAT appointment. Patient was also instructed that they will need to review over the PAT instructions again at home before surgery.

## 2022-12-09 NOTE — Anesthesia Preprocedure Evaluation (Addendum)
Anesthesia Evaluation  Patient identified by MRN, date of birth, ID band Patient awake    Reviewed: Allergy & Precautions, H&P , NPO status , Patient's Chart, lab work & pertinent test results  Airway Mallampati: III  TM Distance: <3 FB Neck ROM: Limited    Dental no notable dental hx.    Pulmonary Current Smoker and Patient abstained from smoking.   Pulmonary exam normal breath sounds clear to auscultation       Cardiovascular hypertension, + CAD, + Past MI, + Cardiac Stents and + CABG  Normal cardiovascular exam Rhythm:Regular Rate:Normal     Neuro/Psych negative neurological ROS  negative psych ROS   GI/Hepatic negative GI ROS, Neg liver ROS,,,  Endo/Other  negative endocrine ROS    Renal/GU negative Renal ROS  negative genitourinary   Musculoskeletal negative musculoskeletal ROS (+)    Abdominal   Peds negative pediatric ROS (+)  Hematology negative hematology ROS (+)   Anesthesia Other Findings   Reproductive/Obstetrics negative OB ROS                             Anesthesia Physical Anesthesia Plan  ASA: 3  Anesthesia Plan: General   Post-op Pain Management: Minimal or no pain anticipated   Induction: Intravenous  PONV Risk Score and Plan: 1 and Ondansetron, Dexamethasone and Treatment may vary due to age or medical condition  Airway Management Planned: LMA  Additional Equipment:   Intra-op Plan:   Post-operative Plan: Extubation in OR  Informed Consent: I have reviewed the patients History and Physical, chart, labs and discussed the procedure including the risks, benefits and alternatives for the proposed anesthesia with the patient or authorized representative who has indicated his/her understanding and acceptance.     Dental advisory given  Plan Discussed with: CRNA and Surgeon  Anesthesia Plan Comments: (See PAT note 12/08/2022)       Anesthesia Quick  Evaluation

## 2022-12-09 NOTE — Progress Notes (Signed)
Anesthesia Chart Review   Case: [id number] Date/Time: 12/19/22 0715   Procedure: TRANSURETHRAL RESECTION OF THE PROSTATE (TURP) - 90 MINS FOR CASE   Anesthesia type: General   Pre-op diagnosis: BENIGN PROSTATE HYPERPLASIA , RETENTION   Location: [facility] PROCEDURE RO[facility] / [facility]   Surgeons: Jerilee Field, MD       DISCUSSION:72 y.o. smoker with h/o CAD (CABG), PAF, CKD, prostate cancer, BPH with urinary retention scheduled for above procedure 12/19/2022 with Dr. Perlie Gold.   Pt last seen by cardiology 71/11/24. Per OV note, "Cardiac arrest with ventricular fibrillation (HCC) -> 20 minutes CPR with ROSC (Chronic). Early bystander PCI essentially stayed his life. My suspicion is that he had global ischemia related to dehydration and vasovagal syncope after his COVID-19 shot. He became ischemic because of multivessel disease. Now status post CABG. Stable with no active angina or heart failure symptoms. No arrhythmia symptoms. He is on a stable dose of beta-blocker and normal EF, we will push too far.   Preop cardiovascular exam - Primary  For the most part he remains in sinus rhythm and doing well without any active symptoms. He is pretty active.  As part of his upcoming operation he will need to hold his Eliquis.  Recommendation would be to hold Eliquis 2 to 3 days preop for surgeries or procedures.  Can then hold at least 1 to 2 days postop until safe.  Does not need bridging. Continue beta-blocker. He is able to achieve 4 METS without any active anginal symptoms or heart failure. He does have increased risk because of renal insufficiency, however the surgery is relatively low risk from a cardiovascular standpoint therefore would be considered class II risk-okay 6% risk for adverse cardiac events based simply on his renal insufficiency. He does have history ischemic CAD, but has been revascularized with no active symptoms. He has history of A-fib but no recurrent symptoms.  Would recommend  proceeding with his prostate surgery without any further cardiac evaluation. Okay to hold Eliquis as noted."  Anticipate pt can proceed with planned procedure barring acute status change.   VS: BP 134/86   Pulse 70   Temp 36.7 C (Oral)   Ht 6\' 2"  (1.88 m)   Wt 99.8 kg   SpO2 99%   BMI 28.25 kg/m   PROVIDERS: [facility]  Cardiologist - Dr. Bryan Lemma  LABS: Labs reviewed: Acceptable for surgery. (all labs ordered are listed, but only abnormal results are displayed)  Labs Reviewed  CBC - Abnormal; Notable for the following components:      Result Value   MCV 78.9 (*)    MCH 24.6 (*)    RDW 16.9 (*)    All other components within normal limits  BASIC METABOLIC PANEL     IMAGES:   EKG:   CV: Echo 08/10/2019 1. Left ventricular ejection fraction, by estimation, is 55 to 60%. The  left ventricle has normal function. The left ventricle demonstrates  regional wall motion abnormalities (see scoring diagram/findings for  description). There is mild concentric left  ventricular hypertrophy. Left ventricular diastolic parameters are  consistent with Grade I diastolic dysfunction (impaired relaxation).   2. Right ventricular systolic function is normal. The right ventricular  size is normal.   3. The mitral valve is grossly normal. No evidence of mitral valve  regurgitation. No evidence of mitral stenosis.   4. The aortic valve is grossly normal. Aortic valve regurgitation is not  visualized. No aortic stenosis is present.  Past Medical History:  Diagnosis Date   Anticoagulated    eliquis --- managed by cardiology   Benign localized prostatic hyperplasia with lower urinary tract symptoms (LUTS)    CAD S/P 4V PCI 08/10/2019   08/09/19: m-dLCx 80%--OM2 100% @ small branch take-off. --> DES PCI: 2.25 mm x 28 mm Synergy DES (2.75 mm);  pRCA 50%-80% tandem lesions --> DES PCI: 3.5 mm x 24 mm Synergy DES (4.1 mm) 08/29/19: Staged Bifurcation PCI of p-dLAD@D1  with  CSI atherectomy and scoring low PTA of ostial D1-  (4 total stents, initial stent from LAD into D1 (Synergy 2.75 x 16-2.9 mm, with 3 overlapping stents in L   Chronic kidney disease    Dysrhythmia    ED (erectile dysfunction)    Full dentures    H/O: upper GI bleed 05/01/2020   Admitted with acute blood loss anemia-4+ units PRBC.  Provocative bleeding scan-small bowel AVMs s/p Cauterization 05/08/2020   History of anemia    History of cancer chemotherapy    06-11-2020  to 06-07-2021 lung cancer   History of external beam radiation therapy    03-21-2020  to  05-11-2020  RLL   History of ST elevation myocardial infarction (STEMI) 08/09/2019   History of sudden cardiac arrest successfully resuscitated 08/09/2019   15 minutes following second COVID-19 vaccination -> vasovagal episode during the hypotension followed by VF cardiac arrest >20 min@ [place] [facility] [facility]; found to have 4 vessel CAD on cath with culprit 100% OM 3--now status post four-vessel PCI   History of sustained ventricular fibrillation 08/09/2019   In setting of acute MI   Malignant neoplasm prostate [facility]) 12/2021   urologist--- dr eskridge/   radiation onologist--- dr Kathrynn Running;   dx 07/ 2023   Multiple vessel coronary artery disease 08/10/2019   Heart Cath -PCI 08/09/2019:  CULPRIT LESION: Mid Cx to Dist Cx lesion is 80% stenosed. 3rd Mrg lesion is 100% stenosed at small branch take-off. DES PCI: 2.25 mm x 28 mm Synergy DES (proximal postdilated 2.75 mm.  Post intervention, there is a 0% residual stenosis.  Small side branch was occluded Ost RCA to Prox RCA lesion is 50% stenosed.Prox RCA lesion is 80% stenosed.  DES PCI: 3.5 mm x 24 mm Sy   Myocardial infarction [facility])    Paroxysmal atrial fibrillation (HCC) 04/06/2020   cardiologist---- dr Herbie [place] Presented to [facility] -in A. fib RVR  04-07-2020  s/p DCCV   Pre-diabetes    S/P drug eluting coronary stent placement 08/09/2019   total 6 stents involving 4V   Squamous cell  carcinoma of bronchus in right lower lobe (HCC) 01/2020   [facility]  (Dr Ottis Stain) CT scan 01/28/2020-status post bronchoscopy with biopsy -> pathology T3, N2, M0 poorly differentiated; MRI Brain - NO intracranial Mets.;   completed chemo 06-07-2021 and radiation 05-11-2010    Past Surgical History:  Procedure Laterality Date   COLONOSCOPY WITH ESOPHAGOGASTRODUODENOSCOPY (EGD)  05/02/2020   [facility]: No evidence of acute bleeding, colon polyps noted.   CORONARY ATHERECTOMY N/A 08/29/2019   Procedure: CORONARY ATHERECTOMY;  Surgeon: Marykay Lex, MD;  Location: [facility] INVASIVE CV LAB;  Service: Cardiovascular; SCORING BALLOON ANGIOPLASTY- ostial D1, CSI ATHERECTOMY p-mLAD -> followed by bifurcation stenting   CORONARY STENT INTERVENTION N/A 08/09/2019   Procedure: CORONARY STENT INTERVENTION;  Surgeon: Runell Gess, MD::  2V PCI: Culprit lesion ~100% OM3 (with 80% main LCx) --> DES PCI covering both lesions-Synergy DES 2.25 mm x 28  mm, postdilated to 2.75 mm; LESION #2 proximal RCA 50% followed by 80% --> DES PCI covering both-Synergy DES 3.5 mm x 24 mm   CORONARY STENT INTERVENTION N/A 08/29/2019   Procedure: CORONARY STENT INTERVENTION;  Surgeon: Marykay Lex, MD:: Bifurcation DES PCI, Reverse Culotte Technique W/ KB (4 total SYNERGY DES stents, 1st (2.75X16 - 2.9 MM) - pLAD-ostD1 & 3 overlapping in LAD (3.0 x 38, 3.0 x 16, 2.75 x16 - tapered POT postdilation 3.6-3.1 & apical LAD PTA only.   CYSTOSCOPY N/A 08/18/2019   Procedure: CYSTOSCOPY CLOT EVACUATION FULGURATION 0.5-2 CM.;  Surgeon: Jerilee Field, MD;  Location: [facility] OR;  Service: Urology;  Laterality: N/A;   ESOPHAGOGASTRODUODENOSCOPY W/ BANDING  05/08/2020   [facility]: identified Small Bowel AVMs - > Rx with Cauterization   GOLD SEED IMPLANT N/A 04/04/2022   Procedure: GOLD SEED IMPLANT;  Surgeon: Despina Arias, MD;  Location: [facility];  Service: Urology;  Laterality: N/A;   LEFT  HEART CATH AND CORONARY ANGIOGRAPHY N/A 08/09/2019   Procedure: RIGHT & LEFT HEART CATH AND CORONARY ANGIOGRAPHY;  Surgeon: Runell Gess, MD;  Location: [facility] INVASIVE CV LAB:: post-arrest : 80% LCx-100% OM3 (DES PCI), pRCA tandem 50 & 80% (DES PCI), Bifurcation LAD (75% calcified)-ostial D1 95%.  LVEDP 11 mmHg, PCWP 15 mmHg.    MESENTERIC ARTERIOGRAM  05/07/2020   [facility]: No evidence of bleed or target ASM for treatment (Presented with ABLA - UGIB)    SPACE OAR INSTILLATION N/A 04/04/2022   Procedure: SPACE OAR INSTILLATION;  Surgeon: Despina Arias, MD;  Location: [facility];  Service: Urology;  Laterality: N/A;   TRANSTHORACIC ECHOCARDIOGRAM  08/10/2019    Post cardiac arrest: EF 55 to 60%.  No R WMA.  GR 1 DD.  Normal RV.  Normal valves.   TRANSTHORACIC ECHOCARDIOGRAM  02/23/2020   normal LV systolic function with an EF of 55-60%. The left atrium was mildly dilated.   Trivial pericardial effusion.   VIDEO BRONCHOSCOPY WITH RADIAL ENDOBRONCHIAL ULTRASOUND  02/09/2020   [facility]: Dr. Arabella Merles => for RLL Lung mass: Prominent adenopathy in the paratracheal (2R-4R) station, subcarinal station(7), and 11 L.  Complex adenopathy/Mass in R [place] (unable to determine nodes versus direct tumor invasion). EBUS-transBronch Needle Asp @ 11L, 7 & 2R LNs-> No evidence of Malignancy; TO of Bronchus Intermedius w/ both endobronchial tumor & extrinsic compression. Bx taken.    MEDICATIONS:  acetaminophen (TYLENOL) 500 MG tablet   Ascorbic Acid (VITAMIN C PO)   atorvastatin (LIPITOR) 40 MG tablet   ELIQUIS 5 MG TABS tablet   finasteride (PROSCAR) 5 MG tablet   metoprolol tartrate (LOPRESSOR) 50 MG tablet   No current facility-administered medications for this encounter.    Jodell Cipro Ward, PA-C [facility] Pre-Surgical Testing [phone]

## 2022-12-17 NOTE — H&P (Signed)
Office Visit Report     11/19/2022   --------------------------------------------------------------------------------   John Love  MRN: [id number]  DOB: 09/25/1950, 72 year old Male  SSN:    PRIMARY CARE:  Jackie Plum, MD  PRIMARY CARE FAX:  [phone]  REFERRING:  Doyne Keel, MD  PROVIDER:  Jerilee Field, M.D.  LOCATION:  [facility] [phone]     --------------------------------------------------------------------------------   CC/HPI: F/u -   1) PCa -intermediate risk prostate cancer July 2023 on a fusion biopsy treated with ST-ADT (Aug 2023 - Feb 2024 Orgovyx) and 5.5 weeks of prostate IMRT completed DEC 2023. His 2021 PSA was 4.8 and then he developed lung cancer and needed chemo and radiation. His PSA rose March 2023 up to 6.4 with a normal DRE and he underwent a prostate MRI with several PI-RADS 4 lesions.   Biopsy:  July 2023 intermediate risk prostate cancer  PSA 6.4  T1c  Prostate 117 g  GG3, 1 core, 60%, +PNI  GG2, 2 cores, 80%, +PNI  GG1, 3 cores, 10 to 50%  6/18   Staging:  April 2023 prostate MRI with findings:  -a 2 cm PI-RADS 4 lesion right peripheral zone mid  -a 1.4 cm PI-RADS 4 lesion left anterior  -a 8 mm PI-RADS 4 lesion/nodule right peripheral zone apex  -Staging negative  -Prostate 82 g (PSA density 0.08)   2) BPH - since 2021. Patient was seen for urinary retention and hemorrhagic cystitis consult February 2021 p Covid vaccine. Prostate about 65 g on imaging and exam. Benign. Cysto benign with a large capacity bladder. He was started on tamsulosin.   Follow-up postvoid residual was 856 and then on ultrasound 1026, but no hydronephrosis and no mass. He had right epididymo-orchitis 06/21. [place] c/w E-O and benign. Resolved with abx. His PSA 07/21 was 4.83. He had DES placed 02/21. He ran out of tamsulosin. He had some dizziness with BID. Restart QHS. Voiding well. UA clear.   3) ED - He asked Dr. Hazle Coca NP  if he could take pde5i and it was approved because he doesn't have NTG, chest pain and is clear for exercise. He walks around the block. He tried sildenafil without a lot of benefit - up to 100 mg.   Today, seen for the above. He is due for a PSA and a testosterone. He completed his radiation December 2023. Orgovyx February 2024. He went into urinary retention February 2024 with painful inability to void and had greater than 1 L PVR. Foley catheter placed. He failed a voiding trial. In May 2024 urodynamics revealed a large capacity hypotonic bladder. Cystoscopy today with a large median lobe obstructing his bladder neck. He has a long prostatic urethra. Trabeculated bladder. He failed VT p filling with 300 ml.   He is on plavix and eliquis - Dr. Herbie [place]     ALLERGIES: No Allergies    MEDICATIONS: Tamsulosin Hcl 0.4 mg capsule 1 capsule PO BID  Atorvastatin Calcium 80 mg tablet  Carvedilol 25 mg tablet  Eliquis 5 mg tablet  Metoprolol Tartrate 50 mg tablet 1 tablet PO Daily  Multivitamin  Sildenafil Citrate 20 mg tablet take 1 - 5 tablets as needed before sex  Tadalafil 20 mg tablet take 1 tablet 1 hour prior to sexual activity PRN     GU PSH: Complex cystometrogram, w/ void pressure and urethral pressure profile studies, any technique - 10/29/2022 Complex Uroflow - 10/29/2022 Cystoscopy Irrigate Clot - 2021 Cystoscopy TURBT <2  cm - 2021 Emg surf Electrd - 10/29/2022 Intrabd voidng Press - 10/29/2022 PLACE RT DEVICE/MARKER, PROS - 04/04/2022 Prostate Needle Biopsy - 12/31/2021 Transperineal Plmt Biodegradable Matrl 1/Mlt Njx - 04/04/2022     NON-GU PSH: Surgical Pathology, Gross And Microscopic Examination For Prostate Needle - 12/31/2021     GU PMH: Urinary Retention - 10/29/2022, - 10/08/2022, - 09/24/2022 Prostate Cancer - 10/08/2022, - 09/24/2022, Discussed nature r/b/a to Orgovyx / ADT and rationale for 6 mo course. Scheduling sheet turned in for Gold seeds and space oar. We went over  the nature r/b of the procedure including rectal and urethral injury among others. Discussed a colleague would likely be doing the procedure. His T is 337 and he wants to "boost it up" in the future. We can consider it after PCa treatment. Also disc decreased ejaculate. , - 02/10/2022, I had a long discussion with the patient using the understanding prostate cancer booklet and his path report as a reference. We discussed his stage, grade and prognosis. We discussed the nature risks and benefits of active surveillance, radical prostatectomy, external beam radiotherapy, and brachytherapy. We discussed the role of androgen deprivation and chemotherapy in prostate cancer. We also discussed other ablative techniques such as HiFU and cryotherapy as well as whole gland versus focal treatment. We discussed specifically how each treatment might affect bowel, bladder and sexual function. We discussed how each treatment might effect salvage treatments and active surveillance might lead to progression and more difficult treatment in the future. All questions answered. He is interested in external beam +/- ADT. He asked about T replacement after prostate cancer treatment. He is concerned about libido and ED which he is having. Discussed T replacement after prostate ca tx is controversial but we will discuss it and can consider it depending on his #'s. I did send a T today. We will consider Orgovyx if ADT is needed. ALso disc nature r/b/a to spaceoar and gold seeds and a colleague may be doing it. , - 01/17/2022 Acute Cystitis/UTI - 09/10/2022, - 08/29/2022, - 08/08/2022 BPH w/LUTS - 09/10/2022, - 08/29/2022, - 08/08/2022, He is on tamsulosin and will double up to BID. ALso he is starting ADT which will shrink prostate. Also disc surgical procedures. He wants to avoid cystoscopy and surgery. , - 02/10/2022, - 11/27/2021, - 08/15/2021, start tams QHS. Disc 5ari but he already has ED and declines. , - 2021, Intraoperative DRE noted a  non-nodular gland approximately 60 grams in size., - 2021 Incomplete bladder emptying - 09/10/2022, - 08/29/2022, - 08/08/2022, - 02/10/2022, - 11/27/2021, cont tams , - 08/15/2021, - 2021 Nocturnal Enuresis - 09/10/2022, - 08/29/2022 Dysuria - 08/29/2022 Urinary Urgency - 08/29/2022 ED due to arterial insufficiency, we again disc affect of pca tx on sex fxn. I sent a T level. We disc nature ED and management of ED. PDE5i, inj, IPP. - 01/17/2022, PDE5i not as effective. ALso disc ICI, VED . He will consider. , - 11/27/2021, trial of tadalafil . Also disc injections - nature r.b.a - he would need to hold pressure on the injection site. , - 08/15/2021, Disc nature r/b of pde5i and he will start. Went over dosing - 20 mg and up. , - 2021 Elevated PSA, Once we got him into position and more relaxed from the probe in place he did quite well. Minimal bleeding. Prostate measured 117 g but that may be slightly overinflated due to the median lobe and the long length. - 12/31/2021, I discussed with the patient the  MRI results. We discussed the significance of the PI-RADS scale and concern for prostate cancer although they could be benign or inflammation. We discussed the nature risk benefits to fusion biopsy. We discussed continued surveillance could result in a delay of diagnosis with progression of disease and not often but metastatic prostate cancer which would be incurable. All questions answered., - 11/27/2021, send a PSA and disc nature of PSA elevation - MRI and biopsy. Go with MRI given dual anti platelet therapy. , - 08/15/2021, We discussed the nature, risks and benefits of PSA screening as well as the nature of elevated PSA (benign versus malignant). We discussed the possibility of prostate cancer and that as the PSA rises above the level of 2.5 and even over 1, the risk of prostate cancer on biopsy increases. We discussed the management of prostate cancer might include active surveillance or treatment depending on patient and  cancer characteristics. In that context, we discussed the nature, risks and benefits of continued surveillance, other lab tests, transrectal ultrasound/prostate biopsy, or prostate MRI. All questions answered. , - 2021 Epididymo-orchitis, ua clear - 2021 Gross hematuria - 2021    NON-GU PMH: GERD Gout Heart disease, unspecified Hypercholesterolemia Hypertension Sleep Apnea    FAMILY HISTORY: 1 Daughter - Daughter Death In The Family Father - Father Sickle Cell Hb-[place] Disease - Cousin   SOCIAL HISTORY: Marital Status: Married Preferred Language: English; Ethnicity: Not Hispanic Or Latino Current Smoking Status: Patient smokes occasionally. Has smoked since 08/14/1989. Smokes 1 pack per day.   Tobacco Use Assessment Completed: Used Tobacco in last 30 days? Drinks 4 drinks per day. Types of alcohol consumed: Beer.  Drinks 1 caffeinated drink per day. 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    REVIEW OF SYSTEMS:    GU Review Male:   Patient denies frequent urination, hard to postpone urination, burning/ pain with urination, get up at night to urinate, leakage of urine, stream starts and stops, trouble starting your stream, have to strain to urinate , erection problems, and penile pain.  Gastrointestinal (Upper):   Patient denies nausea, vomiting, and indigestion/ heartburn.  Gastrointestinal (Lower):   Patient denies diarrhea and constipation.  Constitutional:   Patient denies fever, night sweats, weight loss, and fatigue.  Skin:   Patient denies skin rash/ lesion and itching.  Eyes:   Patient denies blurred vision and double vision.  Ears/ Nose/ Throat:   Patient denies sore throat and sinus problems.  Hematologic/Lymphatic:   Patient denies swollen glands and easy bruising.  Cardiovascular:   Patient denies leg swelling and chest pains.  Respiratory:   Patient denies cough and shortness of breath.  Endocrine:   Patient denies excessive thirst.  Musculoskeletal:   Patient  denies back pain and joint pain.  Neurological:   Patient denies headaches and dizziness.  Psychologic:   Patient denies depression and anxiety.   VITAL SIGNS: None   MULTI-SYSTEM PHYSICAL EXAMINATION:    Constitutional: Well-nourished. No physical deformities. Normally developed. Good grooming.  Neck: Neck symmetrical, not swollen. Normal tracheal position.  Respiratory: No labored breathing, no use of accessory muscles.   Cardiovascular: Normal temperature, normal extremity pulses, no swelling, no varicosities.  Skin: No paleness, no jaundice, no cyanosis. No lesion, no ulcer, no rash.  Neurologic / Psychiatric: Oriented to time, oriented to place, oriented to person. No depression, no anxiety, no agitation.  Gastrointestinal: No mass, no tenderness, no rigidity, non obese abdomen.     Complexity of Data:  Records Review:  Previous Doctor Records  Urodynamics Review:   Review Urodynamics Tests   08/15/21 12/20/19  PSA  Total PSA 6.42 ng/mL 4.83 ng/mL  Free PSA 0.83 ng/mL   % Free PSA 13 % PSA     01/17/22  Hormones  Testosterone, Total 337.7 ng/dL   Notes:                     dr Kathrynn Running    PROCEDURES:         Flexible Cystoscopy - 52000  Risks, benefits, and some of the potential complications of the procedure were discussed with the patient. All questions were answered. Informed consent was obtained. Antibiotic prophylaxis was given -- Cephalexin. Sterile technique and intraurethral analgesia were used.  Meatus:  Normal size. Normal location. Normal condition.  Urethra:  No strictures.  External Sphincter:  Normal.  Verumontanum:  Normal.  Prostate:  Obstructing. Enlarged median lobe. Moderate hyperplasia.  Bladder Neck:  Non-obstructing.  Ureteral Orifices:  Normal location. Normal size. Normal shape. Effluxed clear urine.  Bladder:  Moderate trabeculation. No tumors. Normal mucosa. No stones.      The lower urinary tract was carefully examined. The procedure was  well-tolerated and without complications. Antibiotic instructions were given. Instructions were given to call the office immediately for bloody urine, difficulty urinating, painful urination, fever, chills, nausea, vomiting or other illness. The patient stated that he understood these instructions and would comply with them.        Simple Foley Catheterization - [id number]  A 16 French Foley catheter was inserted into the bladder using sterile technique. Numbing Jelly instilled. The patient was taught routine catheter care. A leg bag was connected. 200 cc of urine was obtained.   ASSESSMENT:      ICD-10 Details  1 GU:   BPH w/LUTS - N40.1 Chronic, Worsening - He is in a tough situation. He scout a large prostate and a large capacity hypotonic bladder. We went over the nature risk benefits and alternatives to TURP. We discussed continuing the catheter, learning CIC or a suprapubic tube. TURP of the median lobe may give him a reasonable chance to void. We discussed he is at increased risk for failure with a hypotonic bladder. We went over the nature risk benefits of a TURP including incontinence, stricture, failure to void, bleeding, infection among other risk. All questions answered. He elects to proceed. I did go ahead and start him on finasteride today.  2   Incomplete bladder emptying - R39.14 Chronic, Worsening  3   Prostate Cancer - C61 Chronic, Stable - Check labs   PLAN:            Medications New Meds: Finasteride 5 mg tablet 1 tablet PO Daily   #90  0 Refill(s)  Pharmacy Name:  [id number]  Address:  [address]   [place], [place] [id number]  Phone:  [phone]  Fax:  [phone]            Orders Labs PSA, Total Testosterone          Schedule Return Visit/Planned Activity: Next Available Appointment - Schedule Surgery          Document Letter(s):  Created for Patient: Clinical Summary         Notes:   cc: Dr. Julio Sicks         Next Appointment:       Next Appointment: 12/12/2022 09:30 AM    Appointment Type: Laboratory Appointment  Location: [facility] [phone]    Provider: Lab LAB    Reason for Visit: 3 MO PSA/TOTAL T      * Signed by Jerilee Field, M.D. on 11/19/22 at 5:14 PM (EDT)*

## 2022-12-19 ENCOUNTER — Other Ambulatory Visit: Payer: Self-pay

## 2022-12-19 ENCOUNTER — Encounter (HOSPITAL_COMMUNITY)
Admission: RE | Disposition: A | Discharge status: Home / Self Care | Payer: Self-pay | Source: Home / Self Care | Attending: Urology

## 2022-12-19 ENCOUNTER — Ambulatory Visit (HOSPITAL_COMMUNITY)
Admission: RE | Admit: 2022-12-19 | Discharge: 2022-12-19 | Disposition: A | Discharge status: Home / Self Care | Payer: Medicare Other | Source: Home / Self Care | Attending: Urology | Admitting: Urology

## 2022-12-19 ENCOUNTER — Ambulatory Visit (HOSPITAL_BASED_OUTPATIENT_CLINIC_OR_DEPARTMENT_OTHER): Payer: Medicare Other | Admitting: Registered Nurse

## 2022-12-19 ENCOUNTER — Encounter (HOSPITAL_COMMUNITY): Payer: Self-pay | Admitting: Urology

## 2022-12-19 ENCOUNTER — Ambulatory Visit (HOSPITAL_COMMUNITY): Payer: Medicare Other | Admitting: Physician Assistant

## 2022-12-19 DIAGNOSIS — F17210 Nicotine dependence, cigarettes, uncomplicated: Secondary | ICD-10-CM | POA: Insufficient documentation

## 2022-12-19 DIAGNOSIS — N138 Other obstructive and reflux uropathy: Secondary | ICD-10-CM

## 2022-12-19 DIAGNOSIS — I25118 Atherosclerotic heart disease of native coronary artery with other forms of angina pectoris: Secondary | ICD-10-CM | POA: Diagnosis not present

## 2022-12-19 DIAGNOSIS — Z7902 Long term (current) use of antithrombotics/antiplatelets: Secondary | ICD-10-CM | POA: Insufficient documentation

## 2022-12-19 DIAGNOSIS — Z9221 Personal history of antineoplastic chemotherapy: Secondary | ICD-10-CM | POA: Insufficient documentation

## 2022-12-19 DIAGNOSIS — R310 Gross hematuria: Secondary | ICD-10-CM | POA: Diagnosis not present

## 2022-12-19 DIAGNOSIS — Z7901 Long term (current) use of anticoagulants: Secondary | ICD-10-CM | POA: Insufficient documentation

## 2022-12-19 DIAGNOSIS — I129 Hypertensive chronic kidney disease with stage 1 through stage 4 chronic kidney disease, or unspecified chronic kidney disease: Secondary | ICD-10-CM | POA: Diagnosis not present

## 2022-12-19 DIAGNOSIS — R338 Other retention of urine: Secondary | ICD-10-CM | POA: Insufficient documentation

## 2022-12-19 DIAGNOSIS — Z87891 Personal history of nicotine dependence: Secondary | ICD-10-CM

## 2022-12-19 DIAGNOSIS — Z85118 Personal history of other malignant neoplasm of bronchus and lung: Secondary | ICD-10-CM | POA: Insufficient documentation

## 2022-12-19 DIAGNOSIS — Z951 Presence of aortocoronary bypass graft: Secondary | ICD-10-CM | POA: Insufficient documentation

## 2022-12-19 DIAGNOSIS — Z923 Personal history of irradiation: Secondary | ICD-10-CM | POA: Insufficient documentation

## 2022-12-19 DIAGNOSIS — N99820 Postprocedural hemorrhage and hematoma of a genitourinary system organ or structure following a genitourinary system procedure: Secondary | ICD-10-CM | POA: Diagnosis not present

## 2022-12-19 DIAGNOSIS — Z955 Presence of coronary angioplasty implant and graft: Secondary | ICD-10-CM | POA: Insufficient documentation

## 2022-12-19 DIAGNOSIS — N183 Chronic kidney disease, stage 3 unspecified: Secondary | ICD-10-CM

## 2022-12-19 DIAGNOSIS — N401 Enlarged prostate with lower urinary tract symptoms: Secondary | ICD-10-CM

## 2022-12-19 DIAGNOSIS — I2510 Atherosclerotic heart disease of native coronary artery without angina pectoris: Secondary | ICD-10-CM | POA: Insufficient documentation

## 2022-12-19 DIAGNOSIS — C61 Malignant neoplasm of prostate: Secondary | ICD-10-CM | POA: Insufficient documentation

## 2022-12-19 DIAGNOSIS — I10 Essential (primary) hypertension: Secondary | ICD-10-CM | POA: Insufficient documentation

## 2022-12-19 HISTORY — PX: TRANSURETHRAL RESECTION OF PROSTATE: SHX73

## 2022-12-19 SURGERY — TURP (TRANSURETHRAL RESECTION OF PROSTATE)
Anesthesia: General

## 2022-12-19 MED ORDER — FENTANYL CITRATE (PF) 100 MCG/2ML IJ SOLN
INTRAMUSCULAR | Status: AC
  Filled 2022-12-19: qty 2

## 2022-12-19 MED ORDER — OXYCODONE HCL 5 MG/5ML PO SOLN: 5.0000 mg | Freq: Once | ORAL | Status: AC | PRN

## 2022-12-19 MED ORDER — DEXAMETHASONE SODIUM PHOSPHATE 10 MG/ML IJ SOLN
INTRAMUSCULAR | Status: DC | PRN
  Administered 2022-12-19: 8 mg via INTRAVENOUS

## 2022-12-19 MED ORDER — LIDOCAINE HCL URETHRAL/MUCOSAL 2 % EX GEL
CUTANEOUS | Status: AC
  Filled 2022-12-19: qty 5

## 2022-12-19 MED ORDER — FENTANYL CITRATE PF 50 MCG/ML IJ SOSY
25.0000 ug | PREFILLED_SYRINGE | INTRAMUSCULAR | Status: DC | PRN
  Administered 2022-12-19: 50 ug via INTRAVENOUS

## 2022-12-19 MED ORDER — LIDOCAINE HCL (PF) 2 % IJ SOLN
INTRAMUSCULAR | Status: AC
  Filled 2022-12-19: qty 5

## 2022-12-19 MED ORDER — FENTANYL CITRATE (PF) 100 MCG/2ML IJ SOLN
INTRAMUSCULAR | Status: DC | PRN
  Administered 2022-12-19 (×2): 25 ug via INTRAVENOUS
  Administered 2022-12-19: 50 ug via INTRAVENOUS
  Administered 2022-12-19: 25 ug via INTRAVENOUS

## 2022-12-19 MED ORDER — PROPOFOL 10 MG/ML IV BOLUS
INTRAVENOUS | Status: DC | PRN
  Administered 2022-12-19: 150 mg via INTRAVENOUS

## 2022-12-19 MED ORDER — ORAL CARE MOUTH RINSE: 15.0000 mL | Freq: Once | OROMUCOSAL | Status: AC

## 2022-12-19 MED ORDER — MIDAZOLAM HCL 2 MG/2ML IJ SOLN
INTRAMUSCULAR | Status: AC
  Filled 2022-12-19: qty 2

## 2022-12-19 MED ORDER — ONDANSETRON HCL 4 MG/2ML IJ SOLN: 4.0000 mg | Freq: Once | INTRAMUSCULAR | Status: DC | PRN

## 2022-12-19 MED ORDER — ACETAMINOPHEN 10 MG/ML IV SOLN
INTRAVENOUS | Status: AC
  Filled 2022-12-19: qty 100

## 2022-12-19 MED ORDER — MIDAZOLAM HCL 5 MG/5ML IJ SOLN
INTRAMUSCULAR | Status: DC | PRN
  Administered 2022-12-19: 2 mg via INTRAVENOUS

## 2022-12-19 MED ORDER — SODIUM CHLORIDE 0.9 % IR SOLN
Status: DC | PRN
  Administered 2022-12-19: 24000 mL via INTRAVESICAL

## 2022-12-19 MED ORDER — LACTATED RINGERS IV SOLN: INTRAVENOUS | Status: DC

## 2022-12-19 MED ORDER — ACETAMINOPHEN 10 MG/ML IV SOLN
1000.0000 mg | Freq: Once | INTRAVENOUS | Status: DC | PRN
  Administered 2022-12-19: 1000 mg via INTRAVENOUS

## 2022-12-19 MED ORDER — LIDOCAINE 2% (20 MG/ML) 5 ML SYRINGE
INTRAMUSCULAR | Status: DC | PRN
  Administered 2022-12-19: 100 mg via INTRAVENOUS

## 2022-12-19 MED ORDER — SODIUM CHLORIDE 0.9 % IV SOLN
2.0000 g | INTRAVENOUS | Status: AC
  Administered 2022-12-19: 2 g via INTRAVENOUS

## 2022-12-19 MED ORDER — SODIUM CHLORIDE 0.9 % IV SOLN
INTRAVENOUS | Status: AC
  Filled 2022-12-19: qty 20

## 2022-12-19 MED ORDER — OXYCODONE HCL 5 MG PO TABS
ORAL_TABLET | ORAL | Status: AC
  Filled 2022-12-19: qty 1

## 2022-12-19 MED ORDER — LIDOCAINE HCL URETHRAL/MUCOSAL 2 % EX GEL: CUTANEOUS | Status: DC | PRN

## 2022-12-19 MED ORDER — ONDANSETRON HCL 4 MG/2ML IJ SOLN
INTRAMUSCULAR | Status: AC
  Filled 2022-12-19: qty 2

## 2022-12-19 MED ORDER — NITROFURANTOIN MONOHYD MACRO 100 MG PO CAPS: 100.0000 mg | ORAL_CAPSULE | Freq: Every day | ORAL | 0 refills | Status: DC

## 2022-12-19 MED ORDER — DEXAMETHASONE SODIUM PHOSPHATE 10 MG/ML IJ SOLN
INTRAMUSCULAR | Status: AC
  Filled 2022-12-19: qty 1

## 2022-12-19 MED ORDER — PROPOFOL 10 MG/ML IV BOLUS
INTRAVENOUS | Status: AC
  Filled 2022-12-19: qty 20

## 2022-12-19 MED ORDER — CHLORHEXIDINE GLUCONATE 0.12 % MT SOLN
15.0000 mL | Freq: Once | OROMUCOSAL | Status: AC
  Administered 2022-12-19: 15 mL via OROMUCOSAL

## 2022-12-19 MED ORDER — OXYCODONE HCL 5 MG PO TABS
5.0000 mg | ORAL_TABLET | Freq: Once | ORAL | Status: AC | PRN
  Administered 2022-12-19: 5 mg via ORAL

## 2022-12-19 MED ORDER — ONDANSETRON HCL 4 MG/2ML IJ SOLN
INTRAMUSCULAR | Status: DC | PRN
  Administered 2022-12-19: 4 mg via INTRAVENOUS

## 2022-12-19 MED ORDER — APIXABAN 5 MG PO TABS: ORAL_TABLET | ORAL | 1 refills | Status: DC

## 2022-12-19 MED ORDER — FENTANYL CITRATE PF 50 MCG/ML IJ SOSY
PREFILLED_SYRINGE | INTRAMUSCULAR | Status: AC
  Filled 2022-12-19: qty 1

## 2022-12-19 SURGICAL SUPPLY — 22 items
BAG DRN RND TRDRP ANRFLXCHMBR (UROLOGICAL SUPPLIES) ×1
BAG URINE DRAIN 2000ML AR STRL (UROLOGICAL SUPPLIES) ×1 IMPLANT
BAG URO CATCHER STRL LF (MISCELLANEOUS) ×1 IMPLANT
BAND INSRT 18 STRL LF DISP RB (MISCELLANEOUS) ×1
BAND RUBBER #18 3X1/16 STRL (MISCELLANEOUS) IMPLANT
CATH HEMA 3WAY 30CC 22FR COUDE (CATHETERS) IMPLANT
DRAPE FOOT SWITCH (DRAPES) ×1 IMPLANT
EVACUATOR MICROVAS BLADDER (UROLOGICAL SUPPLIES) ×1 IMPLANT
GLOVE SURG LX STRL 7.5 STRW (GLOVE) ×1 IMPLANT
GOWN STRL REUS W/ TWL XL LVL3 (GOWN DISPOSABLE) ×1 IMPLANT
GOWN STRL REUS W/TWL XL LVL3 (GOWN DISPOSABLE) ×1
HOLDER FOLEY CATH W/STRAP (MISCELLANEOUS) IMPLANT
KIT TURNOVER KIT A (KITS) IMPLANT
LOOP CUT BIPOLAR 24F LRG (ELECTROSURGICAL) IMPLANT
MANIFOLD NEPTUNE II (INSTRUMENTS) ×1 IMPLANT
PACK CYSTO (CUSTOM PROCEDURE TRAY) ×1 IMPLANT
PIN SAFETY STERILE (MISCELLANEOUS) IMPLANT
SYR 30ML LL (SYRINGE) IMPLANT
SYR TOOMEY IRRIG 70ML (MISCELLANEOUS) ×1
SYRINGE TOOMEY IRRIG 70ML (MISCELLANEOUS) ×1 IMPLANT
TUBING CONNECTING 10 (TUBING) ×1 IMPLANT
TUBING UROLOGY SET (TUBING) ×1 IMPLANT

## 2022-12-19 NOTE — Transfer of Care (Signed)
Immediate Anesthesia Transfer of Care Note  Patient: John Love  Procedure(s) Performed: TRANSURETHRAL RESECTION OF THE PROSTATE (TURP)  Patient Location: PACU  Anesthesia Type:General  Level of Consciousness: awake, alert , oriented, and patient cooperative  Airway & Oxygen Therapy: Patient Spontanous Breathing and Patient connected to face mask oxygen  Post-op Assessment: Report given to RN, Post -op Vital signs reviewed and stable, and Patient moving all extremities  Post vital signs: Reviewed and stable  Last Vitals:  Vitals Value Taken Time  BP 172/84 12/19/22 0900  Temp    Pulse 75 12/19/22 0900  Resp 16 12/19/22 0900  SpO2 100 % 12/19/22 0900  Vitals shown include unvalidated device data.  Last Pain:  Vitals:   12/19/22 0555  TempSrc:   PainSc: 0-No pain         Complications: No notable events documented.

## 2022-12-19 NOTE — Interval H&P Note (Signed)
History and Physical Interval Note:  12/19/2022 7:24 AM  John Love  has presented today for surgery, with the diagnosis of BENIGN PROSTATE HYPERPLASIA , RETENTION.  The various methods of treatment have been discussed with the patient and family. After consideration of risks, benefits and other options for treatment, the patient has consented to  Procedure(s) with comments: TRANSURETHRAL RESECTION OF THE PROSTATE (TURP) (N/A) - 90 MINS FOR CASE as a surgical intervention.  The patient's history has been reviewed, patient examined, no change in status, stable for surgery.  I have reviewed the patient's chart and labs.  Questions were answered to the patient's satisfaction.  He is well. Urine clear. On abx this week. Off eliquis x 3 days. Disc again difficulty of tx a large prostate and a large prostate p XRT. Disc again risk of TURP p XRT such as incontinence and stricture among others. He may need a staged procedure and he ultimately may be stuck with foley / fail void trial but has a good chance of success over time.    Jerilee Field

## 2022-12-19 NOTE — Anesthesia Postprocedure Evaluation (Signed)
Anesthesia Post Note  Patient: John Love  Procedure(s) Performed: TRANSURETHRAL RESECTION OF THE PROSTATE (TURP)     Patient location during evaluation: PACU Anesthesia Type: General Level of consciousness: awake and alert Pain management: pain level controlled Vital Signs Assessment: post-procedure vital signs reviewed and stable Respiratory status: spontaneous breathing, nonlabored ventilation, respiratory function stable and patient connected to nasal cannula oxygen Cardiovascular status: blood pressure returned to baseline and stable Postop Assessment: no apparent nausea or vomiting Anesthetic complications: no  No notable events documented.  Last Vitals:  Vitals:   12/19/22 1000 12/19/22 1015  BP: (!) 152/86 (!) 143/80  Pulse: 70 64  Resp: 14 12  Temp: (!) 36.4 C   SpO2: 94% 94%    Last Pain:  Vitals:   12/19/22 1015  TempSrc:   PainSc: 2                  Tito Ausmus S

## 2022-12-19 NOTE — Op Note (Signed)
Preoperative diagnosis: BPH, urinary retention Postoperative diagnosis: Same  Procedure: Transurethral resection of the prostate  Surgeon: Mena Goes  Anesthesia: General  Indication for procedure: John Love is a 72 year old male with a history of BPH and the prostate measuring anywhere from 80 to about 120 g.  He completed radiation therapy for prostate cancer December 2023.  He has been in retention over the past few weeks.  He is failed voiding trials.  Cystoscopy revealed a large obstructing median lobe.  Brought today for TURP.  Findings: On exam the penis was circumcised without mass or lesion.  The glans appeared normal.  On cystoscopy the urethra was unremarkable, the prostate was obstructed by trilobar hypertrophy and a large median lobe.  Median lobe protruded into the bladder.  Bladder was moderately trabeculated with a large cellule superior and posterior.  No suspicious mucosal lesions.  No stone or foreign body in the bladder.  Description of procedure: After consent was obtained patient brought to the operating room.  After adequate anesthesia he was placed in lithotomy position and prepped and draped in the usual sterile fashion.  Timeout was performed to confirm the patient and procedure.  The cystoscope was passed per urethra.  The bladder was inspected with a 30 and 70 degree lens.  The urine was quite clear.  Bladder was irrigated several times.  I then switched out to the continuous-flow resectoscope sheath passed and that with the visual obturator.  I then passed the loop and handle.  The ureteral orifice ease were able to be identified and those were marked.  I then started at the patient's right side 7 o'clock position and made an incision down to the bladder neck and prostate capsule brought that down toward the verumontanum.  This was also repeated on the left side about 5:00.  This nicely outline the median lobe.  The median lobe was resected.  I was able to follow that and  cleanly get the median lobe down to the bladder level and bladder neck level.  All the chips were evacuated.  There was obstructing lateral lobe tissue and this was resected anterior to posterior from bladder neck down to the verumontanum.  First on the left than the right.  The chips were evacuated.  This created an excellent channel.  90% of the resection was of the median lobe I did a much more limited dissection laterally and anteriorly.  No resection about distal from the verumontanum.  Ureteral orifices were identified post resection noted to be well away from the resection of the median lobe and intact.  Careful inspection of the bladder to make sure there were no chips.  Scope was backed out and catheter placed.  Irrigation was clear.  I did place him on light traction and CBI for wake-up.  Will plan to wean off traction and CBI in PACU and hopefully get him home today, which she really hopes to do.  He was awakened and taken to the cover room in stable condition.  Complications: None  Blood loss: 50 mL  Specimens: TURP chips to pathology  Drains: 22 French three-way hematuria catheter  Disposition: Patient stable to PACU.  I discussed with his wife the procedure, postop care and follow-up.

## 2022-12-19 NOTE — Anesthesia Procedure Notes (Addendum)
Procedure Name: LMA Insertion Date/Time: 12/19/2022 8:39 AM  Performed by: Elisabeth Cara, CRNAPre-anesthesia Checklist: Patient identified, Emergency Drugs available, Suction available, Timeout performed and Patient being monitored Patient Re-evaluated:Patient Re-evaluated prior to induction Oxygen Delivery Method: Circle system utilized Preoxygenation: Pre-oxygenation with 100% oxygen Induction Type: IV induction LMA: LMA with gastric port inserted LMA Size: 4.0 Number of attempts: 1 Placement Confirmation: positive ETCO2 and breath sounds checked- equal and bilateral Tube secured with: Tape Dental Injury: Teeth and Oropharynx as per pre-operative assessment

## 2022-12-20 ENCOUNTER — Other Ambulatory Visit: Payer: Self-pay | Admitting: Cardiology

## 2022-12-20 ENCOUNTER — Encounter (HOSPITAL_COMMUNITY): Payer: Self-pay | Admitting: Urology

## 2022-12-20 ENCOUNTER — Other Ambulatory Visit: Payer: Self-pay

## 2022-12-20 ENCOUNTER — Inpatient Hospital Stay (HOSPITAL_COMMUNITY)
Admission: EM | Admit: 2022-12-20 | Discharge: 2022-12-22 | DRG: 988 | Disposition: A | Discharge status: Home / Self Care | Payer: Medicare Other | Attending: Internal Medicine | Admitting: Internal Medicine

## 2022-12-20 DIAGNOSIS — I480 Paroxysmal atrial fibrillation: Secondary | ICD-10-CM | POA: Diagnosis present

## 2022-12-20 DIAGNOSIS — I2510 Atherosclerotic heart disease of native coronary artery without angina pectoris: Secondary | ICD-10-CM | POA: Diagnosis present

## 2022-12-20 DIAGNOSIS — Z85118 Personal history of other malignant neoplasm of bronchus and lung: Secondary | ICD-10-CM

## 2022-12-20 DIAGNOSIS — Z8674 Personal history of sudden cardiac arrest: Secondary | ICD-10-CM

## 2022-12-20 DIAGNOSIS — N99820 Postprocedural hemorrhage and hematoma of a genitourinary system organ or structure following a genitourinary system procedure: Principal | ICD-10-CM | POA: Diagnosis present

## 2022-12-20 DIAGNOSIS — Z955 Presence of coronary angioplasty implant and graft: Secondary | ICD-10-CM

## 2022-12-20 DIAGNOSIS — Z8546 Personal history of malignant neoplasm of prostate: Secondary | ICD-10-CM

## 2022-12-20 DIAGNOSIS — Z79899 Other long term (current) drug therapy: Secondary | ICD-10-CM

## 2022-12-20 DIAGNOSIS — Z7902 Long term (current) use of antithrombotics/antiplatelets: Secondary | ICD-10-CM

## 2022-12-20 DIAGNOSIS — R7303 Prediabetes: Secondary | ICD-10-CM | POA: Diagnosis present

## 2022-12-20 DIAGNOSIS — R319 Hematuria, unspecified: Secondary | ICD-10-CM | POA: Diagnosis present

## 2022-12-20 DIAGNOSIS — N179 Acute kidney failure, unspecified: Secondary | ICD-10-CM | POA: Diagnosis present

## 2022-12-20 DIAGNOSIS — Z923 Personal history of irradiation: Secondary | ICD-10-CM

## 2022-12-20 DIAGNOSIS — E785 Hyperlipidemia, unspecified: Secondary | ICD-10-CM | POA: Diagnosis present

## 2022-12-20 DIAGNOSIS — R310 Gross hematuria: Secondary | ICD-10-CM | POA: Diagnosis present

## 2022-12-20 DIAGNOSIS — N401 Enlarged prostate with lower urinary tract symptoms: Secondary | ICD-10-CM | POA: Diagnosis present

## 2022-12-20 DIAGNOSIS — I252 Old myocardial infarction: Secondary | ICD-10-CM

## 2022-12-20 DIAGNOSIS — Z9221 Personal history of antineoplastic chemotherapy: Secondary | ICD-10-CM

## 2022-12-20 DIAGNOSIS — Z8249 Family history of ischemic heart disease and other diseases of the circulatory system: Secondary | ICD-10-CM

## 2022-12-20 DIAGNOSIS — F17210 Nicotine dependence, cigarettes, uncomplicated: Secondary | ICD-10-CM | POA: Diagnosis present

## 2022-12-20 DIAGNOSIS — I10 Essential (primary) hypertension: Secondary | ICD-10-CM | POA: Diagnosis present

## 2022-12-20 DIAGNOSIS — R338 Other retention of urine: Secondary | ICD-10-CM | POA: Diagnosis present

## 2022-12-20 LAB — CBC WITH DIFFERENTIAL/PLATELET
Abs Immature Granulocytes: 0.07 10*3/uL (ref 0.00–0.07)
Basophils Absolute: 0 10*3/uL (ref 0.0–0.1)
Basophils Relative: 0 %
Eosinophils Absolute: 0 10*3/uL (ref 0.0–0.5)
Eosinophils Relative: 0 %
HCT: 38.6 % — ABNORMAL LOW (ref 39.0–52.0)
Hemoglobin: 12.2 g/dL — ABNORMAL LOW (ref 13.0–17.0)
Immature Granulocytes: 1 %
Lymphocytes Relative: 8 %
Lymphs Abs: 1.1 10*3/uL (ref 0.7–4.0)
MCH: 24.7 pg — ABNORMAL LOW (ref 26.0–34.0)
MCHC: 31.6 g/dL (ref 30.0–36.0)
MCV: 78.3 fL — ABNORMAL LOW (ref 80.0–100.0)
Monocytes Absolute: 1.1 10*3/uL — ABNORMAL HIGH (ref 0.1–1.0)
Monocytes Relative: 8 %
Neutro Abs: 11.8 10*3/uL — ABNORMAL HIGH (ref 1.7–7.7)
Neutrophils Relative %: 83 %
Platelets: 238 10*3/uL (ref 150–400)
RBC: 4.93 MIL/uL (ref 4.22–5.81)
RDW: 16.2 % — ABNORMAL HIGH (ref 11.5–15.5)
WBC: 14.1 10*3/uL — ABNORMAL HIGH (ref 4.0–10.5)
nRBC: 0 % (ref 0.0–0.2)

## 2022-12-20 LAB — BASIC METABOLIC PANEL
Anion gap: 10 (ref 5–15)
BUN: 29 mg/dL — ABNORMAL HIGH (ref 8–23)
CO2: 24 mmol/L (ref 22–32)
Calcium: 9.1 mg/dL (ref 8.9–10.3)
Chloride: 103 mmol/L (ref 98–111)
Creatinine, Ser: 1.47 mg/dL — ABNORMAL HIGH (ref 0.61–1.24)
GFR, Estimated: 51 mL/min — ABNORMAL LOW (ref >60)
Glucose, Bld: 117 mg/dL — ABNORMAL HIGH (ref 70–99)
Potassium: 4.8 mmol/L (ref 3.5–5.1)
Sodium: 137 mmol/L (ref 135–145)

## 2022-12-20 MED ORDER — SODIUM CHLORIDE 0.9 % IR SOLN
3000.0000 mL | Status: DC
  Administered 2022-12-20: 3000 mL

## 2022-12-20 NOTE — Discharge Instructions (Addendum)
You were seen in the emergency department for your hematuria.  You likely had some rebleeding after your surgery and after irrigating your bladder we were able to get your Foley to drain and your bleeding stopped.  You should can continue to hold your Eliquis until your urologist tells you that it is safe to resume.  You should call your urologist to let them know that you had bleeding this weekend on Monday and see if they would like to see you in the office sooner or if they are okay with your current follow-up appointment.  You should return to the emergency department if you feel like your Foley catheter is not draining or is clogged, you have significant abdominal pain, you have fevers or if you have any other new or concerning symptoms.

## 2022-12-20 NOTE — ED Triage Notes (Signed)
Pt had a TURP yesterday and had urinary catheter placed. Pt states that he thinks the catheter tubing is "defective" because he is having bleeding and urine leaking from catheter. Denies pain.

## 2022-12-20 NOTE — ED Notes (Signed)
Bladder scan reading greater than 

## 2022-12-20 NOTE — ED Notes (Signed)
Large amount of blood clots irrigated from pts foley catheter. Continuous bladder irrigation started

## 2022-12-20 NOTE — ED Provider Notes (Signed)
[place] EMERGENCY DEPARTMENT AT [facility] Provider Note   CSN: [id number] Arrival date & time: 12/20/22  2045     History {Add pertinent medical, surgical, social history, OB history to HPI:1} Chief Complaint  Patient presents with   Hematuria    John Love is a 72 y.o. male.  Patient is a 72 year old male with a past medical history of prostate cancer status post radiation and TURP procedure yesterday, A-fib on Eliquis presenting to the emergency department with hematuria.  The patient states that when he left the hospital yesterday he was having pink-tinged urine in his Foley bag.  He states that today the urine appeared dark red blood and does not appear to be draining appropriately.  He states that his abdomen feels full like he is having urinary retention but denies any abdominal pain.  He states that he has not seen any blood clots in the Foley bag.  He states that blood has been leaking out of some of the Foley ports.  He states that he has not restarted his Eliquis since the surgery.  The history is provided by the patient and the spouse.  Hematuria       Home Medications Prior to Admission medications   Medication Sig Start Date End Date Taking? Authorizing Provider  acetaminophen (TYLENOL) 500 MG tablet Take 500 mg by mouth every 6 (six) hours as needed for moderate pain.    [provider]  apixaban (ELIQUIS) 5 MG TABS tablet TAKE 1 TABLET(5 MG) BY MOUTH TWICE DAILY 12/22/22   Jerilee Field, MD  Ascorbic Acid (VITAMIN C PO) Take 1 tablet by mouth daily.    [provider]  atorvastatin (LIPITOR) 40 MG tablet TAKE 1 TABLET(40 MG) BY MOUTH DAILY 02/14/22   Marykay Lex, MD  finasteride (PROSCAR) 5 MG tablet Take 5 mg by mouth daily. 11/19/22   [provider]  metoprolol tartrate (LOPRESSOR) 50 MG tablet TAKE 1 TABLET(50 MG) BY MOUTH TWICE DAILY 10/10/22   Marykay Lex, MD  nitrofurantoin, macrocrystal-monohydrate,  (MACROBID) 100 MG capsule Take 1 capsule (100 mg total) by mouth at bedtime. 12/19/22   Jerilee Field, MD      Allergies    Patient has no known allergies.    Review of Systems   Review of Systems  Genitourinary:  Positive for hematuria.    Physical Exam Updated Vital Signs BP (!) 172/110   Pulse 95   Temp 97.8 F (36.6 C) (Oral)   Resp 15   SpO2 100%  Physical Exam Vitals and nursing note reviewed.  Constitutional:      General: He is not in acute distress.    Appearance: Normal appearance.  HENT:     Head: Normocephalic.     Nose: Nose normal.     Mouth/Throat:     Mouth: Mucous membranes are moist.  Eyes:     Extraocular Movements: Extraocular movements intact.     Conjunctiva/sclera: Conjunctivae normal.  Cardiovascular:     Rate and Rhythm: Normal rate.  Pulmonary:     Effort: Pulmonary effort is normal.  Abdominal:     General: Abdomen is flat.     Palpations: Abdomen is soft.     Tenderness: There is abdominal tenderness (mild suprapubic tenderness with distension).  Genitourinary:    Comments: Foley catheter in place with bright red blood and small clots within the foley bag and tubing, no blood at urethral meatus around catheter Musculoskeletal:  General: Normal range of motion.     Cervical back: Normal range of motion.  Skin:    General: Skin is warm and dry.  Neurological:     General: No focal deficit present.     Mental Status: He is alert and oriented to person, place, and time.  Psychiatric:        Mood and Affect: Mood normal.        Behavior: Behavior normal.     ED Results / Procedures / Treatments   Labs (all labs ordered are listed, but only abnormal results are displayed) Labs Reviewed  URINALYSIS, W/ REFLEX TO CULTURE (INFECTION SUSPECTED)  BASIC METABOLIC PANEL  CBC WITH DIFFERENTIAL/PLATELET    EKG None  Radiology No results found.  Procedures Procedures  {Document cardiac monitor, telemetry assessment procedure  when appropriate:1}  Medications Ordered in ED Medications  sodium chloride irrigation 0.9 % 3,000 mL (has no administration in time range)    ED Course/ Medical Decision Making/ A&P   {   Click here for ABCD2, HEART and other calculatorsREFRESH Note before signing :1}                          Medical Decision Making This patient presents to the ED with chief complaint(s) of hematuria with pertinent past medical history of state cancer status post radiation and TURP procedure yesterday with Foley catheter in place, A-fib on Eliquis which further complicates the presenting complaint. The complaint involves an extensive differential diagnosis and also carries with it a high risk of complications and morbidity.    The differential diagnosis includes urinary retention, gross hematuria, UTI, anemia, obstructive AKI  Additional history obtained: Additional history obtained from spouse Records reviewed previous admission documents  ED Course and Reassessment: On patient's arrival he is hemodynamically stable in no acute distress.  He does have bright red blood with blood clots in his Foley bag in some palpable bladder distention on exam concerning for obstruction.  Bladder scan will be performed and continuous bladder irrigation will be initiated.  Patient will have labs to evaluate for anemia or AKI and will be closely reassessed.  Independent labs interpretation:  The following labs were independently interpreted: ***  Independent visualization of imaging: - I independently visualized the following imaging with scope of interpretation limited to determining acute life threatening conditions related to emergency care: ***, which revealed ***  Consultation: - Consulted or discussed management/test interpretation w/ external professional: ***  Consideration for admission or further workup: *** Social Determinants of health: ***    Amount and/or Complexity of Data Reviewed Labs:  ordered.  Risk Prescription drug management.   ***  {Document critical care time when appropriate:1} {Document review of labs and clinical decision tools ie heart score, Chads2Vasc2 etc:1}  {Document your independent review of radiology images, and any outside records:1} {Document your discussion with family members, caretakers, and with consultants:1} {Document social determinants of health affecting pt's care:1} {Document your decision making why or why not admission, treatments were needed:1} Final Clinical Impression(s) / ED Diagnoses Final diagnoses:  None    Rx / DC Orders ED Discharge Orders     None

## 2022-12-21 ENCOUNTER — Observation Stay (HOSPITAL_COMMUNITY): Payer: Medicare Other

## 2022-12-21 DIAGNOSIS — R338 Other retention of urine: Secondary | ICD-10-CM | POA: Diagnosis present

## 2022-12-21 DIAGNOSIS — R319 Hematuria, unspecified: Secondary | ICD-10-CM | POA: Diagnosis present

## 2022-12-21 DIAGNOSIS — N179 Acute kidney failure, unspecified: Secondary | ICD-10-CM | POA: Diagnosis present

## 2022-12-21 DIAGNOSIS — Z79899 Other long term (current) drug therapy: Secondary | ICD-10-CM | POA: Diagnosis not present

## 2022-12-21 DIAGNOSIS — Z8674 Personal history of sudden cardiac arrest: Secondary | ICD-10-CM | POA: Diagnosis not present

## 2022-12-21 DIAGNOSIS — Z9221 Personal history of antineoplastic chemotherapy: Secondary | ICD-10-CM | POA: Diagnosis not present

## 2022-12-21 DIAGNOSIS — I2510 Atherosclerotic heart disease of native coronary artery without angina pectoris: Secondary | ICD-10-CM | POA: Diagnosis present

## 2022-12-21 DIAGNOSIS — Z8546 Personal history of malignant neoplasm of prostate: Secondary | ICD-10-CM | POA: Diagnosis not present

## 2022-12-21 DIAGNOSIS — N401 Enlarged prostate with lower urinary tract symptoms: Secondary | ICD-10-CM | POA: Diagnosis present

## 2022-12-21 DIAGNOSIS — R310 Gross hematuria: Principal | ICD-10-CM | POA: Diagnosis present

## 2022-12-21 DIAGNOSIS — F17210 Nicotine dependence, cigarettes, uncomplicated: Secondary | ICD-10-CM | POA: Diagnosis present

## 2022-12-21 DIAGNOSIS — I480 Paroxysmal atrial fibrillation: Secondary | ICD-10-CM | POA: Diagnosis present

## 2022-12-21 DIAGNOSIS — R7303 Prediabetes: Secondary | ICD-10-CM | POA: Diagnosis present

## 2022-12-21 DIAGNOSIS — Z7902 Long term (current) use of antithrombotics/antiplatelets: Secondary | ICD-10-CM | POA: Diagnosis not present

## 2022-12-21 DIAGNOSIS — I10 Essential (primary) hypertension: Secondary | ICD-10-CM | POA: Diagnosis present

## 2022-12-21 DIAGNOSIS — Z923 Personal history of irradiation: Secondary | ICD-10-CM | POA: Diagnosis not present

## 2022-12-21 DIAGNOSIS — Z85118 Personal history of other malignant neoplasm of bronchus and lung: Secondary | ICD-10-CM | POA: Diagnosis not present

## 2022-12-21 DIAGNOSIS — E785 Hyperlipidemia, unspecified: Secondary | ICD-10-CM | POA: Diagnosis present

## 2022-12-21 DIAGNOSIS — I252 Old myocardial infarction: Secondary | ICD-10-CM | POA: Diagnosis not present

## 2022-12-21 DIAGNOSIS — N99820 Postprocedural hemorrhage and hematoma of a genitourinary system organ or structure following a genitourinary system procedure: Secondary | ICD-10-CM | POA: Diagnosis present

## 2022-12-21 DIAGNOSIS — Z8249 Family history of ischemic heart disease and other diseases of the circulatory system: Secondary | ICD-10-CM | POA: Diagnosis not present

## 2022-12-21 DIAGNOSIS — Z955 Presence of coronary angioplasty implant and graft: Secondary | ICD-10-CM | POA: Diagnosis not present

## 2022-12-21 LAB — CBC
HCT: 34.8 % — ABNORMAL LOW (ref 39.0–52.0)
Hemoglobin: 11 g/dL — ABNORMAL LOW (ref 13.0–17.0)
MCH: 24.9 pg — ABNORMAL LOW (ref 26.0–34.0)
MCHC: 31.6 g/dL (ref 30.0–36.0)
MCV: 78.7 fL — ABNORMAL LOW (ref 80.0–100.0)
Platelets: 189 10*3/uL (ref 150–400)
RBC: 4.42 MIL/uL (ref 4.22–5.81)
RDW: 16.1 % — ABNORMAL HIGH (ref 11.5–15.5)
WBC: 10.5 10*3/uL (ref 4.0–10.5)
nRBC: 0 % (ref 0.0–0.2)

## 2022-12-21 LAB — BASIC METABOLIC PANEL
Anion gap: 7 (ref 5–15)
BUN: 25 mg/dL — ABNORMAL HIGH (ref 8–23)
CO2: 27 mmol/L (ref 22–32)
Calcium: 9 mg/dL (ref 8.9–10.3)
Chloride: 105 mmol/L (ref 98–111)
Creatinine, Ser: 1.17 mg/dL (ref 0.61–1.24)
GFR, Estimated: >60 mL/min (ref >60)
Glucose, Bld: 99 mg/dL (ref 70–99)
Potassium: 4.5 mmol/L (ref 3.5–5.1)
Sodium: 139 mmol/L (ref 135–145)

## 2022-12-21 LAB — URINALYSIS, W/ REFLEX TO CULTURE (INFECTION SUSPECTED)
Glucose, UA: NEGATIVE mg/dL
RBC / HPF: >50 RBC/hpf (ref 0–5)

## 2022-12-21 LAB — PHOSPHORUS: Phosphorus: 3.7 mg/dL (ref 2.5–4.6)

## 2022-12-21 LAB — HIV ANTIBODY (ROUTINE TESTING W REFLEX): HIV Screen 4th Generation wRfx: NONREACTIVE

## 2022-12-21 LAB — MAGNESIUM: Magnesium: 2.1 mg/dL (ref 1.7–2.4)

## 2022-12-21 MED ORDER — SODIUM CHLORIDE 0.9% FLUSH
10.0000 mL | INTRAVENOUS | Status: DC | PRN
  Administered 2022-12-22: 10 mL

## 2022-12-21 MED ORDER — CHLORHEXIDINE GLUCONATE CLOTH 2 % EX PADS
6.0000 | MEDICATED_PAD | Freq: Every day | CUTANEOUS | Status: DC
  Administered 2022-12-21 – 2022-12-22 (×2): 6 via TOPICAL

## 2022-12-21 MED ORDER — ACETAMINOPHEN 325 MG PO TABS
650.0000 mg | ORAL_TABLET | Freq: Four times a day (QID) | ORAL | Status: DC | PRN
  Administered 2022-12-22: 650 mg via ORAL
  Filled 2022-12-21: qty 2

## 2022-12-21 MED ORDER — ATORVASTATIN CALCIUM 40 MG PO TABS
40.0000 mg | ORAL_TABLET | Freq: Every day | ORAL | Status: DC
  Administered 2022-12-21 – 2022-12-22 (×2): 40 mg via ORAL
  Filled 2022-12-21 (×2): qty 1

## 2022-12-21 MED ORDER — PROCHLORPERAZINE EDISYLATE 10 MG/2ML IJ SOLN: 5.0000 mg | Freq: Four times a day (QID) | INTRAMUSCULAR | Status: DC | PRN

## 2022-12-21 MED ORDER — POLYETHYLENE GLYCOL 3350 17 G PO PACK: 17.0000 g | PACK | Freq: Every day | ORAL | Status: DC | PRN

## 2022-12-21 MED ORDER — MELATONIN 5 MG PO TABS: 5.0000 mg | ORAL_TABLET | Freq: Every evening | ORAL | Status: DC | PRN

## 2022-12-21 MED ORDER — SODIUM CHLORIDE 0.9 % IV SOLN: INTRAVENOUS | Status: AC

## 2022-12-21 MED ORDER — METOPROLOL TARTRATE 25 MG PO TABS
12.5000 mg | ORAL_TABLET | Freq: Two times a day (BID) | ORAL | Status: DC
  Administered 2022-12-21 – 2022-12-22 (×3): 12.5 mg via ORAL
  Filled 2022-12-21 (×3): qty 1

## 2022-12-21 NOTE — H&P (Signed)
History and Physical  REIF BEDGOOD ZOX:[id number] DOB: [birth year] DOA: 12/20/2022  Referring physician: Dr. Manus Gunning, EDP  PCP: Michaelle Birks [facility]  Outpatient Specialists: Urology Patient coming from: Home  Chief Complaint: Blood in the urine.  HPI: John Love is a 72 y.o. male with medical history significant for paroxysmal A-fib on Eliquis, hypertension, hyperlipidemia, urinary retention, BPH status post TURP on 12/19/22, prostate cancer status post radiation therapy, urinary retention for the past few weeks, failed voiding trial.  Followed by urology, status post cystoscopy that revealed a large obstructing median lobe.  He was brought in to [facility] for TURP on 12/19/2022.  Prior to his procedure, home Eliquis was held for 3 days.  He received antibiotics prior to his procedure.  He was discharged from the hospital with a Foley catheter.  The patient presented to the ED today due to gross hematuria and urine leaking from the catheter.  Denies having any pain.  In the ED had a bladder scan showing more than a liter of urine in the bladder.  His Foley catheter was irrigated and relieved large amount of blood clots.  Continuous bladder irrigation was initiated.  EDP discussed the case with urology Dr. Molli Hazard.  Urology agreed with continuous bladder irrigation.  Recommended observation admission overnight by hospitalist service.  The patient was admitted to progressive care unit as observation status.  Currently not hypotensive and vital signs are stable.  ED Course: Tmax 98.2.  BP 164/83, pulse 81, respiratory 16, saturation 97% on room air.  WBC 14.1, hemoglobin 12.2, platelet count 238.  Review of Systems: Review of systems as noted in the HPI. All other systems reviewed and are negative.   Past Medical History:  Diagnosis Date   Anticoagulated    eliquis --- managed by cardiology   Benign localized prostatic hyperplasia with lower urinary tract symptoms (LUTS)     CAD S/P 4V PCI 08/10/2019   08/09/19: m-dLCx 80%--OM2 100% @ small branch take-off. --> DES PCI: 2.25 mm x 28 mm Synergy DES (2.75 mm);  pRCA 50%-80% tandem lesions --> DES PCI: 3.5 mm x 24 mm Synergy DES (4.1 mm) 08/29/19: Staged Bifurcation PCI of p-dLAD@D1  with CSI atherectomy and scoring low PTA of ostial D1-  (4 total stents, initial stent from LAD into D1 (Synergy 2.75 x 16-2.9 mm, with 3 overlapping stents in L   Chronic kidney disease    Dysrhythmia    ED (erectile dysfunction)    Full dentures    H/O: upper GI bleed 05/01/2020   Admitted with acute blood loss anemia-4+ units PRBC.  Provocative bleeding scan-small bowel AVMs s/p Cauterization 05/08/2020   History of anemia    History of cancer chemotherapy    06-11-2020  to 06-07-2021 lung cancer   History of external beam radiation therapy    03-21-2020  to  05-11-2020  RLL   History of ST elevation myocardial infarction (STEMI) 08/09/2019   History of sudden cardiac arrest successfully resuscitated 08/09/2019   15 minutes following second COVID-19 vaccination -> vasovagal episode during the hypotension followed by VF cardiac arrest >20 min@ [facility]; found to have 4 vessel CAD on cath with culprit 100% OM 3--now status post four-vessel PCI   History of sustained ventricular fibrillation 08/09/2019   In setting of acute MI   Malignant neoplasm prostate [facility]) 12/2021   urologist--- dr eskridge/   radiation onologist--- dr Kathrynn Running;   dx 07/ 2023   Multiple vessel coronary artery disease 08/10/2019  Heart Cath -PCI 08/09/2019:  CULPRIT LESION: Mid Cx to Dist Cx lesion is 80% stenosed. 3rd Mrg lesion is 100% stenosed at small branch take-off. DES PCI: 2.25 mm x 28 mm Synergy DES (proximal postdilated 2.75 mm.  Post intervention, there is a 0% residual stenosis.  Small side branch was occluded Ost RCA to Prox RCA lesion is 50% stenosed.Prox RCA lesion is 80% stenosed.  DES PCI: 3.5 mm x 24 mm Sy   Myocardial infarction [facility])     Paroxysmal atrial fibrillation (HCC) 04/06/2020   cardiologist---- dr Herbie [place] Presented to [facility] -[facility] A. fib RVR  04-07-2020  s/p DCCV   Pre-diabetes    S/P drug eluting coronary stent placement 08/09/2019   total 6 stents involving 4V   Squamous cell carcinoma of bronchus in right lower lobe (HCC) 01/2020   followed by [facility] in [facility]  (Dr Ottis Stain) CT scan 01/28/2020-status post bronchoscopy with biopsy -> pathology T3, N2, M0 poorly differentiated; MRI Brain - NO intracranial Mets.;   completed chemo 06-07-2021 and radiation 05-11-2010   Past Surgical History:  Procedure Laterality Date   COLONOSCOPY WITH ESOPHAGOGASTRODUODENOSCOPY (EGD)  05/02/2020   [facility]: No evidence of acute bleeding, colon polyps noted.   CORONARY ATHERECTOMY N/A 08/29/2019   Procedure: CORONARY ATHERECTOMY;  Surgeon: Marykay Lex, MD;  Location: [facility] INVASIVE CV LAB;  Service: Cardiovascular; SCORING BALLOON ANGIOPLASTY- ostial D1, CSI ATHERECTOMY p-mLAD -> followed by bifurcation stenting   CORONARY STENT INTERVENTION N/A 08/09/2019   Procedure: CORONARY STENT INTERVENTION;  Surgeon: Runell Gess, MD::  2V PCI: Culprit lesion ~100% OM3 (with 80% main LCx) --> DES PCI covering both lesions-Synergy DES 2.25 mm x 28 mm, postdilated to 2.75 mm; LESION #2 proximal RCA 50% followed by 80% --> DES PCI covering both-Synergy DES 3.5 mm x 24 mm   CORONARY STENT INTERVENTION N/A 08/29/2019   Procedure: CORONARY STENT INTERVENTION;  Surgeon: Marykay Lex, MD:: Bifurcation DES PCI, Reverse Culotte Technique W/ KB (4 total SYNERGY DES stents, 1st (2.75X16 - 2.9 MM) - pLAD-ostD1 & 3 overlapping in LAD (3.0 x 38, 3.0 x 16, 2.75 x16 - tapered POT postdilation 3.6-3.1 & apical LAD PTA only.   CYSTOSCOPY N/A 08/18/2019   Procedure: CYSTOSCOPY CLOT EVACUATION FULGURATION 0.5-2 CM.;  Surgeon: Jerilee Field, MD;  Location: [facility] OR;  Service: Urology;  Laterality: N/A;   ESOPHAGOGASTRODUODENOSCOPY W/ BANDING   05/08/2020   [facility]: identified Small Bowel AVMs - > Rx with Cauterization   GOLD SEED IMPLANT N/A 04/04/2022   Procedure: GOLD SEED IMPLANT;  Surgeon: Despina Arias, MD;  Location: [facility];  Service: Urology;  Laterality: N/A;   LEFT HEART CATH AND CORONARY ANGIOGRAPHY N/A 08/09/2019   Procedure: RIGHT & LEFT HEART CATH AND CORONARY ANGIOGRAPHY;  Surgeon: Runell Gess, MD;  Location: [facility] INVASIVE CV LAB:: post-arrest : 80% LCx-100% OM3 (DES PCI), pRCA tandem 50 & 80% (DES PCI), Bifurcation LAD (75% calcified)-ostial D1 95%.  LVEDP 11 mmHg, PCWP 15 mmHg.    MESENTERIC ARTERIOGRAM  05/07/2020   [facility]: No evidence of bleed or target ASM for treatment (Presented with ABLA - UGIB)    SPACE OAR INSTILLATION N/A 04/04/2022   Procedure: SPACE OAR INSTILLATION;  Surgeon: Despina Arias, MD;  Location: [facility];  Service: Urology;  Laterality: N/A;   TRANSTHORACIC ECHOCARDIOGRAM  08/10/2019    Post cardiac arrest: EF 55 to 60%.  No R WMA.  GR 1 DD.  Normal RV.  Normal valves.   TRANSTHORACIC ECHOCARDIOGRAM  02/23/2020   normal LV systolic function with an EF of 55-60%. The left atrium was mildly dilated.   Trivial pericardial effusion.   TRANSURETHRAL RESECTION OF PROSTATE N/A 12/19/2022   Procedure: TRANSURETHRAL RESECTION OF THE PROSTATE (TURP);  Surgeon: Jerilee Field, MD;  Location: [facility] ORS;  Service: Urology;  Laterality: N/A;  90 MINS FOR CASE   VIDEO BRONCHOSCOPY WITH RADIAL ENDOBRONCHIAL ULTRASOUND  02/09/2020   [facility]: Dr. Arabella Merles => for RLL Lung mass: Prominent adenopathy in the paratracheal (2R-4R) station, subcarinal station(7), and 11 L.  Complex adenopathy/Mass in R [place] (unable to determine nodes versus direct tumor invasion). EBUS-transBronch Needle Asp @ 11L, 7 & 2R LNs-> No evidence of Malignancy; TO of Bronchus Intermedius w/ both endobronchial tumor & extrinsic compression. Bx taken.    Social History:  reports that he has been  smoking cigarettes. He has a 36.75 pack-year smoking history. He has never used smokeless tobacco. He reports that he does not currently use alcohol after a past usage of about 28.0 - 35.0 standard drinks of alcohol per week. He reports that he does not use drugs.   No Known Allergies  Family History  Problem Relation Age of Onset   Heart disease Father        died in his 3's during circumscion procedure      Prior to Admission medications   Medication Sig Start Date End Date Taking? Authorizing Provider  acetaminophen (TYLENOL) 500 MG tablet Take 500 mg by mouth every 6 (six) hours as needed for moderate pain.   Yes [provider]  apixaban (ELIQUIS) 5 MG TABS tablet TAKE 1 TABLET(5 MG) BY MOUTH TWICE DAILY 12/22/22  Yes Jerilee Field, MD  atorvastatin (LIPITOR) 40 MG tablet TAKE 1 TABLET(40 MG) BY MOUTH DAILY 02/14/22  Yes Marykay Lex, MD  cephALEXin (KEFLEX) 500 MG capsule Take 500 mg by mouth 2 (two) times daily. 12/09/22  Yes [provider]  finasteride (PROSCAR) 5 MG tablet Take 5 mg by mouth every evening. 11/19/22  Yes [provider]  metoprolol tartrate (LOPRESSOR) 50 MG tablet TAKE 1 TABLET(50 MG) BY MOUTH TWICE DAILY 10/10/22  Yes Marykay Lex, MD  nitrofurantoin, macrocrystal-monohydrate, (MACROBID) 100 MG capsule Take 1 capsule (100 mg total) by mouth at bedtime. 12/19/22  Yes Jerilee Field, MD  vitamin B-12 (CYANOCOBALAMIN) 500 MCG tablet Take 500 mcg by mouth daily.   Yes [provider]    Physical Exam: BP (!) 164/83   Pulse 81   Temp 98.2 F (36.8 C)   Resp 16   SpO2 97%   General: 72 y.o. year-old male well developed well nourished in no acute distress.  Alert and oriented x3. Cardiovascular: Regular rate and rhythm with no rubs or gallops.  No thyromegaly or JVD noted.  Trace lower extremity edema bilaterally. Respiratory: Clear to auscultation with no wheezes or rales. Good inspiratory effort. Abdomen: Soft  nontender nondistended with normal bowel sounds x4 quadrants. Muskuloskeletal: No cyanosis or clubbing noted bilaterally Neuro: CN II-XII intact, strength, sensation, reflexes Skin: No ulcerative lesions noted or rashes Psychiatry: Judgement and insight appear normal. Mood is appropriate for condition and setting          Labs on Admission:  Basic Metabolic Panel: Recent Labs  Lab 12/20/22 2215  NA 137  K 4.8  CL 103  CO2 24  GLUCOSE 117*  BUN 29*  CREATININE 1.47*  CALCIUM 9.1   Liver Function Tests: No  results for input(s): "AST", "ALT", "ALKPHOS", "BILITOT", "PROT", "ALBUMIN" in the last 168 hours. No results for input(s): "LIPASE", "AMYLASE" in the last 168 hours. No results for input(s): "AMMONIA" in the last 168 hours. CBC: Recent Labs  Lab 12/20/22 2215  WBC 14.1*  NEUTROABS 11.8*  HGB 12.2*  HCT 38.6*  MCV 78.3*  PLT 238   Cardiac Enzymes: No results for input(s): "CKTOTAL", "CKMB", "CKMBINDEX", "TROPONINI" in the last 168 hours.  BNP (last 3 results) No results for input(s): "BNP" in the last 8760 hours.  ProBNP (last 3 results) No results for input(s): "PROBNP" in the last 8760 hours.  CBG: No results for input(s): "GLUCAP" in the last 168 hours.  Radiological Exams on Admission: No results found.  EKG: I independently viewed the EKG done and my findings are as followed: None available at time of this visit.  Assessment/Plan Present on Admission:  Gross hematuria  Principal Problem:   Gross hematuria  Gross hematuria status post TURP on 12/19/2022 Chronic urinary retention Foley catheter in place Continue, continuous bladder irrigation Urology consulted Closely monitor H&H Gentle IV fluid hydration Maintain MAP greater than 65 N.p.o. until seen by urology.  AKI suspect postobstructive in the setting of urinary retention Normal renal function at baseline Presented with creatinine 1.47 and GFR 51 Avoid nephrotoxic agents, dehydration and  hypotension Gentle IV fluid hydration NS at 50 cc/h x 1 day Monitor urine output  Paroxysmal A-fib on Eliquis Continue to hold off Eliquis for now due to gross hematuria Resume Eliquis when okay with urology  Hyperlipidemia Resume home Lipitor  Hypertension BP is not at goal, elevated Avoid hypotension Low-dose home Lopressor. Maintain MAP greater than 65 Closely monitor vital signs  History of BPH History of prostate cancer status post radiation Management per urology   Time: 75 minutes.    DVT prophylaxis: SCDs  Code Status: Full code  Family Communication: Patient's wife at bedside  Disposition Plan: Admitted to progressive care unit  Consults called: Urology  Admission status: Observation status   Status is: Observation    Darlin Drop MD [facility] Pager [phone]  If 7PM-7AM, please contact night-coverage [email or web address] Password [facility]  12/21/2022, 2:39 AM

## 2022-12-21 NOTE — ED Provider Notes (Signed)
Patient from Dr. Theresia Lo.  Patient status post TURP.  With hematuria and urinary retention getting irrigation.  After irrigation, patient still having gross hematuria with clots.  Discussed with urology resident Dr. Molli Hazard.  She agrees with continued CBI and observation admission overnight as hematuria does not appear to be clearing.  Hemoglobin is stable at 12.  Observation admission discussed with Dr. Margo Aye.   Glynn Octave, MD 12/21/22 [phone]

## 2022-12-21 NOTE — Progress Notes (Signed)
No charge note  Patient seen and examined this morning, admitted overnight, H&P reviewed and I agree with the assessment and plan.  72 year old male with PAF on Eliquis, HTN, HLD, recent TURP on 7/5, prostate cancer s/p radiation therapy comes into the hospital with hematuria.  He is on CBI, urology following.  Scheduled Meds:  atorvastatin  40 mg Oral Daily   Chlorhexidine Gluconate Cloth  6 each Topical Daily   metoprolol tartrate  12.5 mg Oral BID   Continuous Infusions:  sodium chloride 50 mL/hr at 12/21/22 0644   sodium chloride irrigation Stopped (12/21/22 0009)   PRN Meds:.acetaminophen, melatonin, polyethylene glycol, prochlorperazine, sodium chloride flush  Aylen Stradford M. Elvera Lennox, MD, PhD [facility]  Between 7 am - 7 pm you can contact me via Amion (for emergencies) or Securechat (non urgent matters).  I am not available 7 pm - 7 am, please contact night coverage MD/APP via Amion

## 2022-12-21 NOTE — Consult Note (Signed)
Urology Consult Note   Requesting Attending Physician:  Glynn Octave, MD Service Providing Consult: Urology  Consulting Attending: Dr  Modena Slater   Reason for Consult:  Hematuria with clot retention   HPI: John Love is seen in consultation for reasons noted above at the request of Rancour, Jeannett Senior, MD  for evaluation of hematuria with clot retention  This is a 72 y.o. male with h/o CAD s/p PCI, Afib on Eliquis, CKD, lung cancer, BPH s/p TURP of the median lobe on 7/5. He went home with catheter in place. Comes in with retention with >1L of urine on bladder scan. Urology consulted for further management and potential CBI.   Yesterday evening, catheter stopped draining and patient had abdominal fullness. No nausea, vomiting. No fevers or chills. Urine was "bloody" per the patient.   On arrival to the ED, bladder scan was >999cc. Foley irrigated with clot return.  He is still holding is eliquis.      Past Medical History: Past Medical History:  Diagnosis Date   Anticoagulated    eliquis --- managed by cardiology   Benign localized prostatic hyperplasia with lower urinary tract symptoms (LUTS)    CAD S/P 4V PCI 08/10/2019   08/09/19: m-dLCx 80%--OM2 100% @ small branch take-off. --> DES PCI: 2.25 mm x 28 mm Synergy DES (2.75 mm);  pRCA 50%-80% tandem lesions --> DES PCI: 3.5 mm x 24 mm Synergy DES (4.1 mm) 08/29/19: Staged Bifurcation PCI of p-dLAD@D1  with CSI atherectomy and scoring low PTA of ostial D1-  (4 total stents, initial stent from LAD into D1 (Synergy 2.75 x 16-2.9 mm, with 3 overlapping stents in L   Chronic kidney disease    Dysrhythmia    ED (erectile dysfunction)    Full dentures    H/O: upper GI bleed 05/01/2020   Admitted with acute blood loss anemia-4+ units PRBC.  Provocative bleeding scan-small bowel AVMs s/p Cauterization 05/08/2020   History of anemia    History of cancer chemotherapy    06-11-2020  to 06-07-2021 lung cancer   History of external  beam radiation therapy    03-21-2020  to  05-11-2020  RLL   History of ST elevation myocardial infarction (STEMI) 08/09/2019   History of sudden cardiac arrest successfully resuscitated 08/09/2019   15 minutes following second COVID-19 vaccination -> vasovagal episode during the hypotension followed by VF cardiac arrest >20 min@ [place] [facility]; found to have 4 vessel CAD on cath with culprit 100% OM 3--now status post four-vessel PCI   History of sustained ventricular fibrillation 08/09/2019   In setting of acute MI   Malignant neoplasm prostate [facility]) 12/2021   urologist--- dr eskridge/   radiation onologist--- dr Kathrynn Running;   dx 07/ 2023   Multiple vessel coronary artery disease 08/10/2019   Heart Cath -PCI 08/09/2019:  CULPRIT LESION: Mid Cx to Dist Cx lesion is 80% stenosed. 3rd Mrg lesion is 100% stenosed at small branch take-off. DES PCI: 2.25 mm x 28 mm Synergy DES (proximal postdilated 2.75 mm.  Post intervention, there is a 0% residual stenosis.  Small side branch was occluded Ost RCA to Prox RCA lesion is 50% stenosed.Prox RCA lesion is 80% stenosed.  DES PCI: 3.5 mm x 24 mm Sy   Myocardial infarction [facility])    Paroxysmal atrial fibrillation (HCC) 04/06/2020   cardiologist---- dr Herbie [place] Presented to [facility] -in A. fib RVR  04-07-2020  s/p DCCV   Pre-diabetes    S/P drug eluting coronary stent placement  08/09/2019   total 6 stents involving 4V   Squamous cell carcinoma of bronchus in right lower lobe (HCC) 01/2020   followed by [facility] in [facility]  (Dr Ottis Stain) CT scan 01/28/2020-status post bronchoscopy with biopsy -> pathology T3, N2, M0 poorly differentiated; MRI Brain - NO intracranial Mets.;   completed chemo 06-07-2021 and radiation 05-11-2010    Past Surgical History:  Past Surgical History:  Procedure Laterality Date   COLONOSCOPY WITH ESOPHAGOGASTRODUODENOSCOPY (EGD)  05/02/2020   [facility]: No evidence of acute bleeding, colon polyps noted.   CORONARY ATHERECTOMY  N/A 08/29/2019   Procedure: CORONARY ATHERECTOMY;  Surgeon: Marykay Lex, MD;  Location: [facility] INVASIVE CV LAB;  Service: Cardiovascular; SCORING BALLOON ANGIOPLASTY- ostial D1, CSI ATHERECTOMY p-mLAD -> followed by bifurcation stenting   CORONARY STENT INTERVENTION N/A 08/09/2019   Procedure: CORONARY STENT INTERVENTION;  Surgeon: Runell Gess, MD::  2V PCI: Culprit lesion ~100% OM3 (with 80% main LCx) --> DES PCI covering both lesions-Synergy DES 2.25 mm x 28 mm, postdilated to 2.75 mm; LESION #2 proximal RCA 50% followed by 80% --> DES PCI covering both-Synergy DES 3.5 mm x 24 mm   CORONARY STENT INTERVENTION N/A 08/29/2019   Procedure: CORONARY STENT INTERVENTION;  Surgeon: Marykay Lex, MD:: Bifurcation DES PCI, Reverse Culotte Technique W/ KB (4 total SYNERGY DES stents, 1st (2.75X16 - 2.9 MM) - pLAD-ostD1 & 3 overlapping in LAD (3.0 x 38, 3.0 x 16, 2.75 x16 - tapered POT postdilation 3.6-3.1 & apical LAD PTA only.   CYSTOSCOPY N/A 08/18/2019   Procedure: CYSTOSCOPY CLOT EVACUATION FULGURATION 0.5-2 CM.;  Surgeon: Jerilee Field, MD;  Location: [facility] OR;  Service: Urology;  Laterality: N/A;   ESOPHAGOGASTRODUODENOSCOPY W/ BANDING  05/08/2020   [facility]: identified Small Bowel AVMs - > Rx with Cauterization   GOLD SEED IMPLANT N/A 04/04/2022   Procedure: GOLD SEED IMPLANT;  Surgeon: Despina Arias, MD;  Location: [facility];  Service: Urology;  Laterality: N/A;   LEFT HEART CATH AND CORONARY ANGIOGRAPHY N/A 08/09/2019   Procedure: RIGHT & LEFT HEART CATH AND CORONARY ANGIOGRAPHY;  Surgeon: Runell Gess, MD;  Location: [facility] INVASIVE CV LAB:: post-arrest : 80% LCx-100% OM3 (DES PCI), pRCA tandem 50 & 80% (DES PCI), Bifurcation LAD (75% calcified)-ostial D1 95%.  LVEDP 11 mmHg, PCWP 15 mmHg.    MESENTERIC ARTERIOGRAM  05/07/2020   [facility]: No evidence of bleed or target ASM for treatment (Presented with ABLA - UGIB)    SPACE OAR INSTILLATION N/A 04/04/2022   Procedure:  SPACE OAR INSTILLATION;  Surgeon: Despina Arias, MD;  Location: [facility];  Service: Urology;  Laterality: N/A;   TRANSTHORACIC ECHOCARDIOGRAM  08/10/2019    Post cardiac arrest: EF 55 to 60%.  No R WMA.  GR 1 DD.  Normal RV.  Normal valves.   TRANSTHORACIC ECHOCARDIOGRAM  02/23/2020   normal LV systolic function with an EF of 55-60%. The left atrium was mildly dilated.   Trivial pericardial effusion.   TRANSURETHRAL RESECTION OF PROSTATE N/A 12/19/2022   Procedure: TRANSURETHRAL RESECTION OF THE PROSTATE (TURP);  Surgeon: Jerilee Field, MD;  Location: [facility]S;  Service: Urology;  Laterality: N/A;  90 MINS FOR CASE   VIDEO BRONCHOSCOPY WITH RADIAL ENDOBRONCHIAL ULTRASOUND  02/09/2020   [facility]: Dr. Arabella Merles => for RLL Lung mass: Prominent adenopathy in the paratracheal (2R-4R) station, subcarinal station(7), and 11 L.  Complex adenopathy/Mass in R Hilum (unable to determine nodes versus direct tumor invasion). EBUS-transBronch  Needle Asp @ 11L, 7 & 2R LNs-> No evidence of Malignancy; TO of Bronchus Intermedius w/ both endobronchial tumor & extrinsic compression. Bx taken.    Medication: Current Facility-Administered Medications  Medication Dose Route Frequency Provider Last Rate Last Admin   sodium chloride irrigation 0.9 % 3,000 mL  3,000 mL Irrigation Continuous Kingsley, Victoria K, DO   Stopped at 12/21/22 0009   Current Outpatient Medications  Medication Sig Dispense Refill   acetaminophen (TYLENOL) 500 MG tablet Take 500 mg by mouth every 6 (six) hours as needed for moderate pain.     [START ON 12/22/2022] apixaban (ELIQUIS) 5 MG TABS tablet TAKE 1 TABLET(5 MG) BY MOUTH TWICE DAILY 180 tablet 1   atorvastatin (LIPITOR) 40 MG tablet TAKE 1 TABLET(40 MG) BY MOUTH DAILY 90 tablet 3   cephALEXin (KEFLEX) 500 MG capsule Take 500 mg by mouth 2 (two) times daily.     finasteride (PROSCAR) 5 MG tablet Take 5 mg by mouth every evening.     metoprolol tartrate (LOPRESSOR) 50 MG  tablet TAKE 1 TABLET(50 MG) BY MOUTH TWICE DAILY 180 tablet 3   nitrofurantoin, macrocrystal-monohydrate, (MACROBID) 100 MG capsule Take 1 capsule (100 mg total) by mouth at bedtime. 14 capsule 0   vitamin B-12 (CYANOCOBALAMIN) 500 MCG tablet Take 500 mcg by mouth daily.      Allergies: No Known Allergies  Social History: Social History   Tobacco Use   Smoking status: Every Day    Packs/day: 0.75    Years: 49.00    Additional pack years: 0.00    Total pack years: 36.75    Types: Cigarettes   Smokeless tobacco: Never   Tobacco comments:    Pt stopped 02/ 2021 after cardiac arrest w/ cardiac intervention but restarted back (started smoking age 77)  Vaping Use   Vaping Use: Never used  Substance Use Topics   Alcohol use: Not Currently    Alcohol/week: 28.0 - 35.0 standard drinks of alcohol    Types: 28 - 35 Cans of beer per week    Comment: 03-31-2022  pt stated drinks 3-4 beer daily (12 oz each)   Drug use: Never    Family History Family History  Problem Relation Age of Onset   Heart disease Father        died in his 74's during circumscion procedure    Review of Systems 10 systems were reviewed and are negative except as noted specifically in the HPI.  Objective   Vital signs in last 24 hours: BP (!) 164/83   Pulse 81   Temp 98.2 F (36.8 C)   Resp 16   SpO2 97%   Physical Exam General: NAD, A&O, resting, appropriate HEENT: [place]/AT, EOMI, MMM Pulmonary: Normal work of breathing Cardiovascular: HDS, adequate peripheral perfusion Abdomen: Soft, NTTP, nondistended, . GU: foley catheter in place with merlot urine prior to irrigation, No CVA tenderness Extremities: warm and well perfused Neuro: Appropriate, no focal neurological deficits  Most Recent Labs: Lab Results  Component Value Date   WBC 14.1 (H) 12/20/2022   HGB 12.2 (L) 12/20/2022   HCT 38.6 (L) 12/20/2022   PLT 238 12/20/2022    Lab Results  Component Value Date   NA 137 12/20/2022   K 4.8  12/20/2022   CL 103 12/20/2022   CO2 24 12/20/2022   BUN 29 (H) 12/20/2022   CREATININE 1.47 (H) 12/20/2022   CALCIUM 9.1 12/20/2022   MG 1.9 08/10/2019   PHOS 3.1 08/11/2019  Lab Results  Component Value Date   INR 1.3 (H) 08/09/2019   APTT 38 (H) 08/09/2019     Urine Culture: @[id number] (laburin,org,[id number],[id number])@   IMAGING: No results found.  ------  Assessment:  72 y.o. male with CAD s/p PCI, pAFib on AC, BPH who is now s/p median lobe TURP who presents with a clog catheter with over 1L in the bladder. Irrigation of the catheter lead to instant relief with c/f clot retention. Pt admitted for CBI and irrigation.   Hg 12.2 from 14.2 preop. Cr mild elevation from baseline at 1. 47 from BL of 0.9-1.3. Likely in setting of recent retention.   On evaluation at the bedside, urine merlot in color. Hang irrigated 150 cc of clot burden. Urine pink but became cherry red. Decision to start CBI. Foley catheter was placed on traction with tape.    Recommendations: #Hematuria with clot retention -Admit to medicine -continue 3 way catheter to drainage with CBI -Titrate CBI to light pink. Wean as able.  -foley catheter currently on traction. Urology to remove traction tape in the AM.  -Obtain a formal bladder US to assess for clot burden.  -pt will go home with foley catheter. Given c/f bladder stretch injury, catheter should stay for 1 week.  -pt NPO incase of procedure today   #AKI -trend Cr in setting of retention, should improve with proper drainage of bladder   Thank you for this consult. Please contact the urology consult pager with any further questions/concerns.

## 2022-12-22 DIAGNOSIS — R310 Gross hematuria: Secondary | ICD-10-CM | POA: Diagnosis not present

## 2022-12-22 LAB — COMPREHENSIVE METABOLIC PANEL
ALT: 17 U/L (ref 0–44)
AST: 14 U/L — ABNORMAL LOW (ref 15–41)
Albumin: 2.9 g/dL — ABNORMAL LOW (ref 3.5–5.0)
Alkaline Phosphatase: 52 U/L (ref 38–126)
Anion gap: 8 (ref 5–15)
BUN: 23 mg/dL (ref 8–23)
CO2: 25 mmol/L (ref 22–32)
Calcium: 8.2 mg/dL — ABNORMAL LOW (ref 8.9–10.3)
Chloride: 106 mmol/L (ref 98–111)
Creatinine, Ser: 0.95 mg/dL (ref 0.61–1.24)
GFR, Estimated: >60 mL/min (ref >60)
Glucose, Bld: 87 mg/dL (ref 70–99)
Potassium: 4 mmol/L (ref 3.5–5.1)
Sodium: 139 mmol/L (ref 135–145)
Total Bilirubin: 0.4 mg/dL (ref 0.3–1.2)
Total Protein: 5.6 g/dL — ABNORMAL LOW (ref 6.5–8.1)

## 2022-12-22 LAB — CBC
HCT: 30 % — ABNORMAL LOW (ref 39.0–52.0)
Hemoglobin: 9.4 g/dL — ABNORMAL LOW (ref 13.0–17.0)
MCH: 24.7 pg — ABNORMAL LOW (ref 26.0–34.0)
MCHC: 31.3 g/dL (ref 30.0–36.0)
MCV: 78.9 fL — ABNORMAL LOW (ref 80.0–100.0)
Platelets: 155 10*3/uL (ref 150–400)
RBC: 3.8 MIL/uL — ABNORMAL LOW (ref 4.22–5.81)
RDW: 16.2 % — ABNORMAL HIGH (ref 11.5–15.5)
WBC: 7.2 10*3/uL (ref 4.0–10.5)
nRBC: 0 % (ref 0.0–0.2)

## 2022-12-22 LAB — SURGICAL PATHOLOGY

## 2022-12-22 MED ORDER — NITROFURANTOIN MONOHYD MACRO 100 MG PO CAPS: 100.0000 mg | ORAL_CAPSULE | Freq: Every day | ORAL | 0 refills | Status: AC

## 2022-12-22 MED ORDER — HEPARIN SOD (PORK) LOCK FLUSH 100 UNIT/ML IV SOLN
500.0000 [IU] | INTRAVENOUS | Status: AC | PRN
  Administered 2022-12-22: 500 [IU]
  Filled 2022-12-22: qty 5

## 2022-12-22 NOTE — Consult Note (Signed)
Urology Consult Note   Requesting Attending Physician:  Leatha Gilding, MD Service Providing Consult: Urology  Consulting Attending: Dr  Mena Goes   Reason for Consult:  Hematuria with clot retention   HPI: John Love is seen in consultation for reasons noted above at the request of Leatha Gilding, MD  for evaluation of hematuria with clot retention  This is a 72 y.o. male with h/o CAD s/p PCI, Afib on Eliquis, CKD, lung cancer, BPH s/p TURP of the median lobe on 7/5. He went home with catheter in place. Comes in with retention with >1L of urine on bladder scan. Urology consulted for further management and potential CBI.   Yesterday evening, catheter stopped draining and patient had abdominal fullness. No nausea, vomiting. No fevers or chills. Urine was "bloody" per the patient.   On arrival to the ED, bladder scan was >999cc. Foley irrigated with clot return.  He is still holding is eliquis.      Past Medical History: Past Medical History:  Diagnosis Date   Anticoagulated    eliquis --- managed by cardiology   Benign localized prostatic hyperplasia with lower urinary tract symptoms (LUTS)    CAD S/P 4V PCI 08/10/2019   08/09/19: m-dLCx 80%--OM2 100% @ small branch take-off. --> DES PCI: 2.25 mm x 28 mm Synergy DES (2.75 mm);  pRCA 50%-80% tandem lesions --> DES PCI: 3.5 mm x 24 mm Synergy DES (4.1 mm) 08/29/19: Staged Bifurcation PCI of p-dLAD@D1  with CSI atherectomy and scoring low PTA of ostial D1-  (4 total stents, initial stent from LAD into D1 (Synergy 2.75 x 16-2.9 mm, with 3 overlapping stents in L   Chronic kidney disease    Dysrhythmia    ED (erectile dysfunction)    Full dentures    H/O: upper GI bleed 05/01/2020   Admitted with acute blood loss anemia-4+ units PRBC.  Provocative bleeding scan-small bowel AVMs s/p Cauterization 05/08/2020   History of anemia    History of cancer chemotherapy    06-11-2020  to 06-07-2021 lung cancer   History of external  beam radiation therapy    03-21-2020  to  05-11-2020  RLL   History of ST elevation myocardial infarction (STEMI) 08/09/2019   History of sudden cardiac arrest successfully resuscitated 08/09/2019   15 minutes following second COVID-19 vaccination -> vasovagal episode during the hypotension followed by VF cardiac arrest >20 min@ [place] [facility]; found to have 4 vessel CAD on cath with culprit 100% OM 3--now status post four-vessel PCI   History of sustained ventricular fibrillation 08/09/2019   In setting of acute MI   Malignant neoplasm prostate [facility]) 12/2021   urologist--- dr eskridge/   radiation onologist--- dr Kathrynn Running;   dx 07/ 2023   Multiple vessel coronary artery disease 08/10/2019   Heart Cath -PCI 08/09/2019:  CULPRIT LESION: Mid Cx to Dist Cx lesion is 80% stenosed. 3rd Mrg lesion is 100% stenosed at small branch take-off. DES PCI: 2.25 mm x 28 mm Synergy DES (proximal postdilated 2.75 mm.  Post intervention, there is a 0% residual stenosis.  Small side branch was occluded Ost RCA to Prox RCA lesion is 50% stenosed.Prox RCA lesion is 80% stenosed.  DES PCI: 3.5 mm x 24 mm Sy   Myocardial infarction [facility])    Paroxysmal atrial fibrillation (HCC) 04/06/2020   cardiologist---- dr Herbie [place] Presented to [facility] -in A. fib RVR  04-07-2020  s/p DCCV   Pre-diabetes    S/P drug eluting coronary stent  placement 08/09/2019   total 6 stents involving 4V   Squamous cell carcinoma of bronchus in right lower lobe (HCC) 01/2020   followed by [facility] in [facility]  (Dr Ottis Stain) CT scan 01/28/2020-status post bronchoscopy with biopsy -> pathology T3, N2, M0 poorly differentiated; MRI Brain - NO intracranial Mets.;   completed chemo 06-07-2021 and radiation 05-11-2010    Past Surgical History:  Past Surgical History:  Procedure Laterality Date   COLONOSCOPY WITH ESOPHAGOGASTRODUODENOSCOPY (EGD)  05/02/2020   [facility]: No evidence of acute bleeding, colon polyps noted.   CORONARY ATHERECTOMY  N/A 08/29/2019   Procedure: CORONARY ATHERECTOMY;  Surgeon: Marykay Lex, MD;  Location: [facility] INVASIVE CV LAB;  Service: Cardiovascular; SCORING BALLOON ANGIOPLASTY- ostial D1, CSI ATHERECTOMY p-mLAD -> followed by bifurcation stenting   CORONARY STENT INTERVENTION N/A 08/09/2019   Procedure: CORONARY STENT INTERVENTION;  Surgeon: Runell Gess, MD::  2V PCI: Culprit lesion ~100% OM3 (with 80% main LCx) --> DES PCI covering both lesions-Synergy DES 2.25 mm x 28 mm, postdilated to 2.75 mm; LESION #2 proximal RCA 50% followed by 80% --> DES PCI covering both-Synergy DES 3.5 mm x 24 mm   CORONARY STENT INTERVENTION N/A 08/29/2019   Procedure: CORONARY STENT INTERVENTION;  Surgeon: Marykay Lex, MD:: Bifurcation DES PCI, Reverse Culotte Technique W/ KB (4 total SYNERGY DES stents, 1st (2.75X16 - 2.9 MM) - pLAD-ostD1 & 3 overlapping in LAD (3.0 x 38, 3.0 x 16, 2.75 x16 - tapered POT postdilation 3.6-3.1 & apical LAD PTA only.   CYSTOSCOPY N/A 08/18/2019   Procedure: CYSTOSCOPY CLOT EVACUATION FULGURATION 0.5-2 CM.;  Surgeon: Jerilee Field, MD;  Location: [facility] OR;  Service: Urology;  Laterality: N/A;   ESOPHAGOGASTRODUODENOSCOPY W/ BANDING  05/08/2020   [facility]: identified Small Bowel AVMs - > Rx with Cauterization   GOLD SEED IMPLANT N/A 04/04/2022   Procedure: GOLD SEED IMPLANT;  Surgeon: Despina Arias, MD;  Location: [facility];  Service: Urology;  Laterality: N/A;   LEFT HEART CATH AND CORONARY ANGIOGRAPHY N/A 08/09/2019   Procedure: RIGHT & LEFT HEART CATH AND CORONARY ANGIOGRAPHY;  Surgeon: Runell Gess, MD;  Location: [facility] INVASIVE CV LAB:: post-arrest : 80% LCx-100% OM3 (DES PCI), pRCA tandem 50 & 80% (DES PCI), Bifurcation LAD (75% calcified)-ostial D1 95%.  LVEDP 11 mmHg, PCWP 15 mmHg.    MESENTERIC ARTERIOGRAM  05/07/2020   [facility]: No evidence of bleed or target ASM for treatment (Presented with ABLA - UGIB)    SPACE OAR INSTILLATION N/A 04/04/2022   Procedure:  SPACE OAR INSTILLATION;  Surgeon: Despina Arias, MD;  Location[facility];  Service: Urology;  Laterality: N/A;   TRANSTHORACIC ECHOCARDIOGRAM  08/10/2019    Post cardiac arrest: EF 55 to 60%.  No R WMA.  GR 1 DD.  Normal RV.  Normal valves.   TRANSTHORACIC ECHOCARDIOGRAM  02/23/2020   normal LV systolic function with an EF of 55-60%. The left atrium was mildly dilated.   Trivial pericardial effusion.   TRANSURETHRAL RESECTION OF PROSTATE N/A 12/19/2022   Procedure: TRANSURETHRAL RESECTION OF THE PROSTATE (TURP);  Surgeon: Jerilee Field, MD;  Location: [facility];  Service: Urology;  Laterality: N/A;  90 MINS FOR CASE   VIDEO BRONCHOSCOPY WITH RADIAL ENDOBRONCHIAL ULTRASOUND  02/09/2020   [facility]: Dr. Arabella Merles => for RLL Lung mass: Prominent adenopathy in the paratracheal (2R-4R) station, subcarinal station(7), and 11 L.  Complex adenopathy/Mass in R Hilum (unable to determine nodes versus direct tumor invasion).  EBUS-transBronch Needle Asp @ 11L, 7 & 2R LNs-> No evidence of Malignancy; TO of Bronchus Intermedius w/ both endobronchial tumor & extrinsic compression. Bx taken.    Medication: Current Facility-Administered Medications  Medication Dose Route Frequency Provider Last Rate Last Admin   acetaminophen (TYLENOL) tablet 650 mg  650 mg Oral Q6H PRN Darlin Drop, DO   650 mg at 12/22/22 4098   atorvastatin (LIPITOR) tablet 40 mg  40 mg Oral Daily Darlin Drop, DO   40 mg at 12/22/22 1191   Chlorhexidine Gluconate Cloth 2 % PADS 6 each  6 each Topical Daily Leatha Gilding, MD   6 each at 12/22/22 0930   melatonin tablet 5 mg  5 mg Oral QHS PRN Darlin Drop, DO       metoprolol tartrate (LOPRESSOR) tablet 12.5 mg  12.5 mg Oral BID Dow Adolph N, DO   12.5 mg at 12/22/22 4782   polyethylene glycol (MIRALAX / GLYCOLAX) packet 17 g  17 g Oral Daily PRN Dow Adolph N, DO       prochlorperazine (COMPAZINE) injection 5 mg  5 mg Intravenous Q6H PRN Hall, Carole N, DO        sodium chloride flush (NS) 0.9 % injection 10-40 mL  10-40 mL Intracatheter PRN Leatha Gilding, MD       sodium chloride irrigation 0.9 % 3,000 mL  3,000 mL Irrigation Continuous Kingsley, Victoria K, DO   Stopped at 12/21/22 0009    Allergies: No Known Allergies  Social History: Social History   Tobacco Use   Smoking status: Every Day    Packs/day: 0.75    Years: 49.00    Additional pack years: 0.00    Total pack years: 36.75    Types: Cigarettes   Smokeless tobacco: Never   Tobacco comments:    Pt stopped 02/ 2021 after cardiac arrest w/ cardiac intervention but restarted back (started smoking age 23)  Vaping Use   Vaping Use: Never used  Substance Use Topics   Alcohol use: Not Currently    Alcohol/week: 28.0 - 35.0 standard drinks of alcohol    Types: 28 - 35 Cans of beer per week    Comment: 03-31-2022  pt stated drinks 3-4 beer daily (12 oz each)   Drug use: Never    Family History Family History  Problem Relation Age of Onset   Heart disease Father        died in his 30's during circumscion procedure    Review of Systems 10 systems were reviewed and are negative except as noted specifically in the HPI.  Objective   Vital signs in last 24 hours: BP 121/72 (BP Location: Left Arm)   Pulse 78   Temp 97.8 F (36.6 C) (Oral)   Resp 19   Ht 6\' 2"  (1.88 m)   Wt 103.9 kg   SpO2 98%   BMI 29.41 kg/m   Physical Exam General: NAD, A&O, resting, appropriate HEENT: [place]/AT, EOMI, MMM Pulmonary: Normal work of breathing Cardiovascular: HDS, adequate peripheral perfusion Abdomen: Soft, NTTP, nondistended, . GU: foley catheter in place with clear urine on slow drip CBI. No CVA tenderness Extremities: warm and well perfused Neuro: Appropriate, no focal neurological deficits  Most Recent Labs: Lab Results  Component Value Date   WBC 7.2 12/22/2022   HGB 9.4 (L) 12/22/2022   HCT 30.0 (L) 12/22/2022   PLT 155 12/22/2022    Lab Results  Component Value  Date   NA  139 12/22/2022   K 4.0 12/22/2022   CL 106 12/22/2022   CO2 25 12/22/2022   BUN 23 12/22/2022   CREATININE 0.95 12/22/2022   CALCIUM 8.2 (L) 12/22/2022   MG 2.1 12/21/2022   PHOS 3.7 12/21/2022    Lab Results  Component Value Date   INR 1.3 (H) 08/09/2019   APTT 38 (H) 08/09/2019     Urine Culture: @[id number] (laburin,org,[id number],[id number])@   IMAGING: US PELVIS LIMITED (TRANSABDOMINAL ONLY)  Result Date: 12/21/2022 CLINICAL DATA:  Clots within the urinary bladder EXAM: LIMITED ULTRASOUND OF PELVIS TECHNIQUE: Limited transabdominal ultrasound examination of the pelvis was performed. COMPARISON:  None Available. FINDINGS: Hyperechoic region within the bladder most consistent with blood clot measures 1.4 x 1.3 x 1.5 cm. There is a Foley catheter present. IMPRESSION: Hyperechoic region within the bladder most consistent with blood clot measures 1.4 x 1.3 x 1.5 cm. Electronically Signed   By: Deatra Robinson M.D.   On: 12/21/2022 03:17    ------  Assessment:  72 y.o. male with CAD s/p PCI, pAFib on AC, BPH who is now s/p median lobe TURP who presented with clot retention. Pt did well on CBI.   CBI clamped this morning. Urine clear after remaining of CBI for 2 hours.    Recommendations: #Hematuria with clot retention -Continue catheter given retention -ok to discharge with catheter in place -please send home with daily macrobid, another 7 days (ordered) -ok to restart Eliquis on Wednesday 7/10.     Thank you for this consult. Please contact the urology consult pager with any further questions/concerns.

## 2022-12-22 NOTE — Discharge Summary (Signed)
Physician Discharge Summary  John Love OZH:[id number] DOB: 1951-05-30 DOA: 12/20/2022  PCP: Michaelle Birks [facility]  Admit date: 12/20/2022 Discharge date: 12/22/2022  Admitted From: home Disposition:  home  Recommendations for Outpatient Follow-up:  Follow up with PCP in 1-2 weeks Please obtain BMP/CBC in one week Follow up with Urology as an outpatient  Home Health: none Equipment/Devices: none  Discharge Condition: stable CODE STATUS: Full code Diet Orders (From admission, onward)     Start     Ordered   12/21/22 1102  Diet regular Room service appropriate? Yes; Fluid consistency: Thin  Diet effective now       Question Answer Comment  Room service appropriate? Yes   Fluid consistency: Thin      12/21/22 1101            HPI: Per admitting MD, John Love is a 72 y.o. male with medical history significant for paroxysmal A-fib on Eliquis, hypertension, hyperlipidemia, urinary retention, BPH status post TURP on 12/19/22, prostate cancer status post radiation therapy, urinary retention for the past few weeks, failed voiding trial.  Followed by urology, status post cystoscopy that revealed a large obstructing median lobe.  He was brought in to [facility] for TURP on 12/19/2022.  Prior to his procedure, home Eliquis was held for 3 days.  He received antibiotics prior to his procedure.  He was discharged from the hospital with a Foley catheter. The patient presented to the ED today due to gross hematuria and urine leaking from the catheter.  Denies having any pain.  In the ED had a bladder scan showing more than a liter of urine in the bladder.  His Foley catheter was irrigated and relieved large amount of blood clots.  Continuous bladder irrigation was initiated.  EDP discussed the case with urology Dr. Molli Hazard.  Urology agreed with continuous bladder irrigation.  Recommended observation admission overnight by hospitalist service.  Hospital Course / Discharge  diagnoses: Principal Problem:   Gross hematuria Active Problems:   Hematuria  Principal problem Gross hematuria status post TURP 12/19/2022 -urology consulted and followed patient while hospitalized.  He was placed on CBI with improvement in his hematuria.  This was discontinued, he has remained stable without further blood in the urine, and will be discharged home in stable condition with clearance from urology  Active problems PAF -he was recommended to hold his Eliquis upon discharge for 2 additional days, okay to resume on Wednesday per urology Essential hypertension-continue home medications History of BPH-continue medication History of prostate cancer-status postradiation AKI-creatinine normalized  Sepsis ruled out   Discharge Instructions   Allergies as of 12/22/2022   No Known Allergies      Medication List     TAKE these medications    acetaminophen 500 MG tablet Commonly known as: TYLENOL Take 500 mg by mouth every 6 (six) hours as needed for moderate pain.   atorvastatin 40 MG tablet Commonly known as: LIPITOR TAKE 1 TABLET(40 MG) BY MOUTH DAILY   cephALEXin 500 MG capsule Commonly known as: KEFLEX Take 500 mg by mouth 2 (two) times daily.   Eliquis 5 MG Tabs tablet Generic drug: apixaban TAKE 1 TABLET(5 MG) BY MOUTH TWICE DAILY   finasteride 5 MG tablet Commonly known as: PROSCAR Take 5 mg by mouth every evening.   metoprolol tartrate 50 MG tablet Commonly known as: LOPRESSOR TAKE 1 TABLET(50 MG) BY MOUTH TWICE DAILY   nitrofurantoin (macrocrystal-monohydrate) 100 MG capsule Commonly known as: Macrobid Take  1 capsule (100 mg total) by mouth at bedtime.   vitamin B-12 500 MCG tablet Commonly known as: CYANOCOBALAMIN Take 500 mcg by mouth daily.        Follow-up Information     Jerilee Field, MD. Call in 2 days.   Specialty: Urology Contact information: [address] [place] [address] [phone]                  Consultations: Urology   Procedures/Studies:  US PELVIS LIMITED (TRANSABDOMINAL ONLY)  Result Date: 12/21/2022 CLINICAL DATA:  Clots within the urinary bladder EXAM: LIMITED ULTRASOUND OF PELVIS TECHNIQUE: Limited transabdominal ultrasound examination of the pelvis was performed. COMPARISON:  None Available. FINDINGS: Hyperechoic region within the bladder most consistent with blood clot measures 1.4 x 1.3 x 1.5 cm. There is a Foley catheter present. IMPRESSION: Hyperechoic region within the bladder most consistent with blood clot measures 1.4 x 1.3 x 1.5 cm. Electronically Signed   By: Deatra Robinson M.D.   On: 12/21/2022 03:17     Subjective: - no chest pain, shortness of breath, no abdominal pain, nausea or vomiting.   Discharge Exam: BP 121/72 (BP Location: Left Arm)   Pulse 78   Temp 97.8 F (36.6 C) (Oral)   Resp 19   Ht 6\' 2"  (1.88 m)   Wt 103.9 kg   SpO2 98%   BMI 29.41 kg/m   General: Pt is alert, awake, not in acute distress Cardiovascular: RRR, S1/S2 +, no rubs, no gallops Respiratory: CTA bilaterally, no wheezing, no rhonchi Abdominal: Soft, NT, ND, bowel sounds + Extremities: no edema, no cyanosis    The results of significant diagnostics from this hospitalization (including imaging, microbiology, ancillary and laboratory) are listed below for reference.     Microbiology: No results found for this or any previous visit (from the past 240 hour(s)).   Labs: Basic Metabolic Panel: Recent Labs  Lab 12/20/22 2215 12/21/22 0534 12/22/22 0250  NA 137 139 139  K 4.8 4.5 4.0  CL 103 105 106  CO2 24 27 25   GLUCOSE 117* 99 87  BUN 29* 25* 23  CREATININE 1.47* 1.17 0.95  CALCIUM 9.1 9.0 8.2*  MG  --  2.1  --   PHOS  --  3.7  --    Liver Function Tests: Recent Labs  Lab 12/22/22 0250  AST 14*  ALT 17  ALKPHOS 52  BILITOT 0.4  PROT 5.6*  ALBUMIN 2.9*   CBC: Recent Labs  Lab 12/20/22 2215 12/21/22 0534 12/22/22 0250  WBC 14.1* 10.5 7.2   NEUTROABS 11.8*  --   --   HGB 12.2* 11.0* 9.4*  HCT 38.6* 34.8* 30.0*  MCV 78.3* 78.7* 78.9*  PLT 238 189 155   CBG: No results for input(s): "GLUCAP" in the last 168 hours. Hgb A1c No results for input(s): "HGBA1C" in the last 72 hours. Lipid Profile No results for input(s): "CHOL", "HDL", "LDLCALC", "TRIG", "CHOLHDL", "LDLDIRECT" in the last 72 hours. Thyroid function studies No results for input(s): "TSH", "T4TOTAL", "T3FREE", "THYROIDAB" in the last 72 hours.  Invalid input(s): "FREET3" Urinalysis    Component Value Date/Time   COLORURINE RED (A) 12/21/2022 0006   APPEARANCEUR TURBID (A) 12/21/2022 0006   LABSPEC  12/21/2022 0006    TEST NOT REPORTED DUE TO COLOR INTERFERENCE OF URINE PIGMENT   PHURINE  12/21/2022 0006    TEST NOT REPORTED DUE TO COLOR INTERFERENCE OF URINE PIGMENT   GLUCOSEU NEGATIVE 12/21/2022 0006   HGBUR (  A) 12/21/2022 0006    TEST NOT REPORTED DUE TO COLOR INTERFERENCE OF URINE PIGMENT   BILIRUBINUR (A) 12/21/2022 0006    TEST NOT REPORTED DUE TO COLOR INTERFERENCE OF URINE PIGMENT   KETONESUR (A) 12/21/2022 0006    TEST NOT REPORTED DUE TO COLOR INTERFERENCE OF URINE PIGMENT   PROTEINUR (A) 12/21/2022 0006    TEST NOT REPORTED DUE TO COLOR INTERFERENCE OF URINE PIGMENT   NITRITE (A) 12/21/2022 0006    TEST NOT REPORTED DUE TO COLOR INTERFERENCE OF URINE PIGMENT   LEUKOCYTESUR (A) 12/21/2022 0006    TEST NOT REPORTED DUE TO COLOR INTERFERENCE OF URINE PIGMENT    FURTHER DISCHARGE INSTRUCTIONS:   Get Medicines reviewed and adjusted: Please take all your medications with you for your next visit with your Primary MD   Laboratory/radiological data: Please request your Primary MD to go over all hospital tests and procedure/radiological results at the follow up, please ask your Primary MD to get all Hospital records sent to his/her office.   In some cases, they will be blood work, cultures and biopsy results pending at the time of your discharge.  Please request that your primary care M.D. goes through all the records of your hospital data and follows up on these results.   Also Note the following: If you experience worsening of your admission symptoms, develop shortness of breath, life threatening emergency, suicidal or homicidal thoughts you must seek medical attention immediately by calling 911 or calling your MD immediately  if symptoms less severe.   You must read complete instructions/literature along with all the possible adverse reactions/side effects for all the Medicines you take and that have been prescribed to you. Take any new Medicines after you have completely understood and accpet all the possible adverse reactions/side effects.    Do not drive when taking Pain medications or sleeping medications (Benzodaizepines)   Do not take more than prescribed Pain, Sleep and Anxiety Medications. It is not advisable to combine anxiety,sleep and pain medications without talking with your primary care practitioner   Special Instructions: If you have smoked or chewed Tobacco  in the last 2 yrs please stop smoking, stop any regular Alcohol  and or any Recreational drug use.   Wear Seat belts while driving.   Please note: You were cared for by a hospitalist during your hospital stay. Once you are discharged, your primary care physician will handle any further medical issues. Please note that NO REFILLS for any discharge medications will be authorized once you are discharged, as it is imperative that you return to your primary care physician (or establish a relationship with a primary care physician if you do not have one) for your post hospital discharge needs so that they can reassess your need for medications and monitor your lab values.  Time coordinating discharge: 35 minutes  SIGNED:  Pamella Pert, MD, PhD 12/22/2022, 10:50 AM

## 2022-12-22 NOTE — Telephone Encounter (Signed)
Eliquis 5mg  refill request received. Patient is 72 years old, weight-103.9kg, Crea-0.95 on 12/22/22, Diagnosis-Afib, and last seen by Dr. Herbie Baltimore on 11/25/22. Dose is appropriate based on dosing criteria. Will send in refill to requested pharmacy.

## 2022-12-22 NOTE — TOC Progression Note (Signed)
Transition of Care [facility]) - Progression Note    Patient Details  Name: John Love MRN: [id number] Date of Birth: 04/30/1951  Transition [facility] CM/SW Contact  Geni Bers, RN Phone Number: 12/22/2022, 10:14 AM  Clinical Narrative:      Transition of Care (TOC) Screening Note   Patient Details  Name: John Love Date of Birth: [birth year]   Transition of Care [facility]) CM/SW Contact:    Geni Bers, RN Phone Number: 12/22/2022, 10:15 AM    Transition of Care Department [facility]) has reviewed patient and no TOC needs have been identified at this time. We will continue to monitor patient advancement through interdisciplinary progression rounds. If new patient transition needs arise, please place a TOC consult.         Expected Discharge Plan and Services                                               Social Determinants of Health (SDOH) Interventions SDOH Screenings   Food Insecurity: No Food Insecurity (12/21/2022)  Housing: Low Risk  (12/21/2022)  Transportation Needs: No Transportation Needs (12/21/2022)  Utilities: Not At Risk (12/21/2022)  Tobacco Use: High Risk (12/20/2022)    Readmission Risk Interventions     No data to display

## 2023-02-26 ENCOUNTER — Other Ambulatory Visit: Payer: Self-pay | Admitting: Cardiology

## 2023-06-16 ENCOUNTER — Other Ambulatory Visit: Payer: Self-pay | Admitting: Cardiology

## 2023-06-29 ENCOUNTER — Other Ambulatory Visit: Payer: Self-pay | Admitting: Cardiology

## 2023-06-29 DIAGNOSIS — I480 Paroxysmal atrial fibrillation: Secondary | ICD-10-CM

## 2023-06-30 NOTE — Telephone Encounter (Signed)
 Prescription refill request for Eliquis received. Indication:afib Last office visit:6/24 Scr:1.0 Age: 73 Weight:103.9  kg  Prescription refilled

## 2023-11-02 ENCOUNTER — Other Ambulatory Visit: Payer: Self-pay | Admitting: Cardiology

## 2023-12-14 ENCOUNTER — Telehealth: Payer: Self-pay | Admitting: Cardiology

## 2023-12-14 MED ORDER — METOPROLOL TARTRATE 50 MG PO TABS: 50.0000 mg | ORAL_TABLET | Freq: Two times a day (BID) | ORAL | 0 refills | Status: DC

## 2023-12-14 NOTE — Telephone Encounter (Signed)
*  STAT* If patient is at the pharmacy, call can be transferred to refill team.   1. Which medications need to be refilled? (please list name of each medication and dose if known)  metoprolol  tartrate (LOPRESSOR ) 50 MG tablet  2. Which pharmacy/location (including street and city if local pharmacy) is medication to be sent to? [id number] - [place], [place] - [address] AT [facility] [address]  3. Do they need a 30 day or 90 day supply? 90 day supply

## 2023-12-14 NOTE — Telephone Encounter (Signed)
 Pt's medication was sent to pt's pharmacy as requested. Confirmation received.

## 2024-01-07 ENCOUNTER — Other Ambulatory Visit: Payer: Self-pay | Admitting: Cardiology

## 2024-01-07 DIAGNOSIS — I480 Paroxysmal atrial fibrillation: Secondary | ICD-10-CM

## 2024-01-07 NOTE — Telephone Encounter (Signed)
 Prescription refill request for Eliquis  received. Indication: Afib  Last office visit: 11/25/22 [facility])  Scr: 1.05 (11/24/23)  Age: 73  Weight: 103.9kg  Office visit overdue. Pt has appt with Dr Anner on 02/08/24. Refill sent.

## 2024-02-08 ENCOUNTER — Ambulatory Visit: Attending: Cardiology | Admitting: Cardiology

## 2024-02-08 ENCOUNTER — Encounter: Payer: Self-pay | Admitting: Cardiology

## 2024-02-08 VITALS — BP 144/78 | HR 61 | Ht 74.0 in | Wt 231.0 lb

## 2024-02-08 DIAGNOSIS — I10 Essential (primary) hypertension: Secondary | ICD-10-CM | POA: Diagnosis not present

## 2024-02-08 DIAGNOSIS — I469 Cardiac arrest, cause unspecified: Secondary | ICD-10-CM

## 2024-02-08 DIAGNOSIS — I2510 Atherosclerotic heart disease of native coronary artery without angina pectoris: Secondary | ICD-10-CM

## 2024-02-08 DIAGNOSIS — F17200 Nicotine dependence, unspecified, uncomplicated: Secondary | ICD-10-CM

## 2024-02-08 DIAGNOSIS — I480 Paroxysmal atrial fibrillation: Secondary | ICD-10-CM | POA: Diagnosis not present

## 2024-02-08 DIAGNOSIS — I4901 Ventricular fibrillation: Secondary | ICD-10-CM

## 2024-02-08 DIAGNOSIS — I25118 Atherosclerotic heart disease of native coronary artery with other forms of angina pectoris: Secondary | ICD-10-CM | POA: Diagnosis not present

## 2024-02-08 DIAGNOSIS — Z7901 Long term (current) use of anticoagulants: Secondary | ICD-10-CM

## 2024-02-08 DIAGNOSIS — E785 Hyperlipidemia, unspecified: Secondary | ICD-10-CM

## 2024-02-08 DIAGNOSIS — Z9861 Coronary angioplasty status: Secondary | ICD-10-CM

## 2024-02-08 NOTE — Progress Notes (Signed)
 Cardiology Office Note:  .   Date:  02/10/2024  ID:  ZAYYAN MULLEN, DOB 1950/12/21, MRN [id number] PCP: Claudis Meeter [facility]  [facility] Providers Cardiologist:  Alm Clay, MD     Chief Complaint  Patient presents with   Follow-up    Delayed annual follow-up.  Doing well.   Coronary Artery Disease    No active angina or CHF.   Atrial Fibrillation    No breakthrough episodes.  No bleeding    Patient Profile: .     JAYSTEN ESSNER is a 73 y.o. male with a PMH noted below who presents here for delayed annual follow-up at the request of [place], [facility].  Pertinent Recent PMH:  08/09/2019: V Fib arrest during 15 min observation period after 2nd COVID Vaccine => apparent syncope - noted to be pulseless -> CPR (confirmed VF) -> ROSC => Lateral STEMI ->  Cath: 3 V CAD: prox RCA 80%, Ost-prox RCA 50%; D1 95% with prox-mid LAD 75%, & mid-distal LCx 80%-OM3 100% => DES PCI to m-dLCx & DES x 2 to RCA -> d/c on 08/19/2019 Echo EF 55-60%. No RWMA.  08/29/2019: Staged Atherectomy-Bifurcation PCI of LAD-Diag 3 Stentrs in LAD & 1 in Ost Diag. 01/28/2020: [facility] => hemoptysis; CT Chest showed lung mass - 2 rounds of Abx => Seen by Dr. Karena 02/02/2020 -> CT showed RLL mass c/w malignancy => PET on 02/22/20; Brilinta  held for Bx Bronchoscopy 8/26 -> Dx SSCa in RLL -C lung cancer treatment below. Squamous Cell Lung Cancer (Right Lower Lobe -> T3N2Mo - 02/09/2020)  04/06/2020 -NEW ONSET A-FIB> returned to [facility] - noted to be in Afib => unfortunately, he was initiated  on Eliquis , aspirin  and Brilinta  Nov 16-25/21: Admitted with GI bleed => aspirin  Plavix  restarted with plan to restart Eliquis  after 1 month and stop aspirin .  Amlodipine  was discontinued due to hypotension. Noted to have hematuria now status post TURP    I last saw TANMAY HALTEMAN on November 25, 2018 for really as a preop for TURP procedure in July 2024.  He was doing well from cardiac standpoint.   He was companied by his wife.  He was in good spirits.  Stable from cardiac standpoint with no active symptoms of angina or heart failure.  No A-fib episodes or other arrhythmia symptoms.  No syncope or near syncope.  He still the morning smoker's cough but no real dyspnea.  Trivial leg edema as well as back and joint pain.  Subjective  Discussed the use of AI scribe software for clinical note transcription with the patient, who gave verbal consent to proceed.  History of Present Illness JONAVIN SEDER is a 72 year old male with CAD (s/p Cardiac Arrest/lateral MI with MV CAD- MV PCI),  atrial fibrillation and lung cancer who presents for a follow-up visit.  He is doing remarkably well with no major complaints.  No recent episodes to suggest recurrence of A-fib.  He said that in the past when he had A-fib he felt jittery and little bit short of breath.  He has not had any episodes like that.  No sensation of rapid irregular heartbeats.  He denies any chest pain or pressure with rest or exertion.  He may get worn out if he overdoes things, but denies any exertional dyspnea or chest pain.  No PND, orthopnea with trivial edema that he does not pay attention to.  It goes down when he puts  his feet up. Denies any melena, hematochezia, or hematuria or mopped assist/epistaxis on Eliquis .  He seems to doing fairly well from a lung cancer and prostate issue as well  His blood pressure is usually normal at home, although it was noted to be high during this visit. He checks his blood pressure at home and states it is typically normal. He is on metoprolol  for blood pressure management.  He smokes about half a pack of cigarettes a day, which is about half of what he used to smoke.  He has tried to quit, but is found it very difficult.  A couple times he actually was able to stop for about a month but then went right back to it.   Cardiovascular ROS: positive for - mild exertional dyspnea/exercise fatigue, but  this is not anything new from before his MI.  Mostly limited by joint pain. negative for - chest pain, edema, irregular heartbeat, orthopnea, palpitations, paroxysmal nocturnal dyspnea, shortness of breath, or lightheadedness or dizziness, syncope or near syncope, TIA or amaurosis fugax, claudication.  Melena, hematochezia, hematuria or epistaxis.  ROS:  Review of Systems - Negative except symptoms noted above.  He does have some limitations in his activity due to MSK pain.    Objective   Medications: Atorvastatin  40 mg daily, Lopressor  50 mg twice daily, Eliquis  5 mg twice daily; vitamin B12 500 mcg daily and finasteride 5 mg every evening.  Studies Reviewed: SABRA   EKG Interpretation Date/Time:  Monday February 08 2024 12:01:33 EDT Ventricular Rate:  61 PR Interval:  200 QRS Duration:  88 QT Interval:  398 QTC Calculation: 400 R Axis:   50  Text Interpretation: Normal sinus rhythm Possible Anterior infarct , age undetermined When compared with ECG of 30-Aug-2019 05:11, No significant change was found Confirmed by Anner Lenis ([id number]) on 02/08/2024 12:43:36 PM     ECHO: 1. Left ventricular ejection fraction, by estimation, is 55 to 60% . The left ventricle has normal function. The left ventricle demonstrates regional wall motion abnormalities ( see scoring diagram/ findings for description) . There is mild concentric left ventricular hypertrophy. Left ventricular diastolic parameters are consistent with Grade I diastolic dysfunction ( impaired relaxation) . 2. Right ventricular systolic function is normal. The right ventricular size is normal. 3. The mitral valve is grossly normal. No evidence of mitral valve regurgitation. No evidence of mitral stenosis. 4. The aortic valve is grossly normal. Aortic valve regurgitation is not visualized. No aortic stenosis is present.  CATH: Cardiac arrest/lateral STEMI: Culprit 100% OM 3 associated with 80% mid-distal LCx 80% -> DES PCI Synergy XD 2.25 mm x  28 mm going from LCx into OM 3 postdilated to 2.75 mm.  Proximal RCA 50% followed by 80% focal stenosis -> DES PCI with 3.5 mm 24 mm Synergy XD postdilated to 4.0 mm.  Proximal LAD at D1 bifurcation 75% LAD-95% ostial D1 calcified bifurcation lesion-plan for staged PCI.  (Aug 09, 2019) Staged LAD-D1 bifurcation Atherectomy PCI: ScoreFlex PTCA of ostial D1 followed by placement of Synergy XD 2.75 mm x 16 mm stent postdilated to 2.9 mm (Reverse Culotte Technique)-reduced lesion to 20%, with distal inferior branch 45% residual stenosis due to spasm; mid to distal LAD was treated with overlapping DES stents Synergy XD 4-year-old 39 with POT dilation upstream to 3.6 mm and distally down to 3.1 mm.  2 additional stents were placed overlapping to cover dissection with Synergy XD 3.0 mm x 16 mm +3.1 mm and then a further 2.75  mm x 16 mm postdilated from 3.1 to 2.8 mm.  Distal embolization noted with the more distal lesion being treated with angioplasty reducing to 20% apical LAD. Diagnostic: Dominance: Right     Intervention        Staged Intervention     Risk Assessment/Calculations:    CHA2DS2-VASc Score =  This patients CHA2DS2-VASc Score and unadjusted Ischemic Stroke Rate (% per year) is equal to 3.2 % stroke rate/year from a score of 3  Above score calculated as 1 point each if present [CHF, HTN, DM, Vascular=MI/PAD/Aortic Plaque, Age if 65-74, or Male] Above score calculated as 2 points each if present [Age > 75, or Stroke/TIA/TE]   HYPERTENSION CONTROL Vitals:   02/08/24 1157   BP: (!) 146/80 (!) 144/78    The patient's blood pressure is elevated above target today.  Monitor.  Usually pressures are much better.  If elevated, would probably reinitiate ARB       Physical Exam:   VS:  BP (!) 144/78   Pulse 61   Ht 6' 2 (1.88 m)   Wt 231 lb (104.8 kg)   SpO2 97%   BMI 29.66 kg/m    Wt Readings from Last 3 Encounters:  02/08/24 231 lb (104.8 kg)  12/22/22 229 lb 0.9 oz (103.9 kg)   12/19/22 220 lb 0.3 oz (99.8 kg)    Physical Exam GEN: Well nourished, well developed in no acute distress; borderline obese but otherwise healthy-appearing. NECK: No JVD; No carotid bruits CARDIAC: Normal S1, S2; RRR, no murmurs, rubs, gallops RESPIRATORY:  Clear to auscultation without rales, wheezing or rhonchi ; nonlabored, good air movement. ABDOMEN: Soft, non-tender, non-distended EXTREMITIES:  No edema; No deformity       ASSESSMENT AND PLAN: .    Problem List Items Addressed This Visit       Cardiology Problems   CAD S/P 4 V PCI (Chronic)   He has had basically the equivalent of Cath Lab CABG with RCA, LCx-OM 3 and LAD-D2 PCI. Was maintained on DAPT until started on Eliquis  for A-fib. On minimal medications with Lopressor  50 mL twice daily and atorvastatin  is the only other medication in conjunction with Eliquis .      Relevant Orders   Hepatic function panel   Lipid panel   Cardiac arrest with ventricular fibrillation (HCC) -> 20 minutes CPR with ROSC (Chronic)   Thankfully, due to early bystander PCI (firefighter monitoring him following his COVID-vaccine noted syncopal episode likely related to vasovagal syncope in the setting of dehydration after COVID shot.  CPR initiated with ROSC.  Found to have lateral ST elevations on EKG taken to the Cath Lab.  He did not recall ever having any angina, and the only post cath symptoms he noted was related to musculoskeletal chest pain from CPR. Now status post multivessel PCI with preserved EF and no recurrent angina or heart failure symptoms.      Relevant Orders   Hepatic function panel   Lipid panel   Coronary artery disease of native artery of native heart with stable angina pectoris (HCC) (Chronic)   No further evaluation since staged PCI.  EF was relatively preserved on Echocardiogram and he has not had any further angina or heart failure symptoms. Now on minimal medications: Not on aspirin  or Plavix  because of Eliquis .   (He has already had issues with bleeding in the past therefore would not want to overmedicate On stable dose of atorvastatin  with well-controlled lipids as of 2023. Only remaining  blood pressure medication is Lopressor  50 mg twice daily. Continue current meds.  No change.       Relevant Orders   Hepatic function panel   Lipid panel   Essential hypertension - Primary (Chronic)   Relevant Orders   EKG 12-Lead (Completed)   Hyperlipidemia with target low density lipoprotein (LDL) cholesterol less than 55 mg/dL (Chronic)   On atorvastatin  40 mg daily.  Last cholesterol check unk that I have ordered was from back in June 2023. - Order FLP and LFTs       Relevant Orders   Hepatic function panel   Lipid panel   PAF (paroxysmal atrial fibrillation) (HCC); CHA2DS2VASC 3; Eliquis  (Chronic)   He was pretty symptomatic when he had A-fib and has not had any symptoms to suggest has had any breakthrough spells.  He is on Lopressor  50 mg twice daily for rate control and Eliquis  5 mg twice daily for CVA prophylaxis.  Has not required any rhythm control or adjustment of his rate control agents. No bleeding on Eliquis .      Relevant Orders   EKG 12-Lead (Completed)     Other   Current every day smoker (Chronic)   Currently smoking half a pack per day, reduced from previous levels.  Did not tolerate Chantix, and did not like the taste of Nicorette gum. He has cut down dramatically but is still having a hard time making the final step.  Smoking cessation instruction/counseling given:  counseled patient on the dangers of tobacco use, advised patient to stop smoking, and reviewed strategies to maximize success       Current use of long term anticoagulation (Chronic)   Over 4 years out now from multivessel PCI with more recent diagnosis of A-fib.  CHA2DS2-VASc score is 3 No longer on DOAC.  Is on Eliquis  5 mg twice daily. => Okay to hold Eliquis  for 48 to 72 hours preop for surgical procedure  without bridging.  Can hold additional 1 day postop if necessary.               Follow-Up: Return in about 6 months (around 08/10/2024) for 6 month follow-up with me, [employer].  I spent 41 minutes in the care of JACOBS GOLAB today including reviewing labs (2 minutes spent looking for all the labs but the most recent one was from 2023), reviewing studies (I personally reviewed the cath and echo reports and most recent images-7 minutes), face to face time discussing treatment options (16 minutes), reviewing records from previous visits (3 minutes), 13 minutes dictating, and documenting in the encounter.      Signed, Alm MICAEL Clay, MD, MS Alm Clay, M.D., M.S. Interventional Chartered certified accountant  Pager # [phone]

## 2024-02-08 NOTE — Patient Instructions (Signed)
 Medication Instructions:  No changes   *If you need a refill on your cardiac medications before your next appointment, please call your pharmacy*   Lab Work: fasting Lipid Hepatic  If you have labs (blood work) drawn today and your tests are completely normal, you will receive your results only by: MyChart Message (if you have MyChart) OR A paper copy in the mail If you have any lab test that is abnormal or we need to change your treatment, we will call you to review the results.   Testing/Procedures: Not needed   Follow-Up: At [facility], you and your health needs are our priority.  As part of our continuing mission to provide you with exceptional heart care, we have created designated Provider Care Teams.  These Care Teams include your primary Cardiologist (physician) and Advanced Practice Providers (APPs -  Physician Assistants and Nurse Practitioners) who all work together to provide you with the care you need, when you need it.     Your next appointment:   6 month(s)    Call in OCT 2025 for an appointment in FEB 2026  The format for your next appointment:   In Person  Provider:   Alm Clay, MD   Other Instructions

## 2024-02-10 ENCOUNTER — Encounter: Payer: Self-pay | Admitting: Cardiology

## 2024-02-10 NOTE — Assessment & Plan Note (Signed)
 He was pretty symptomatic when he had A-fib and has not had any symptoms to suggest has had any breakthrough spells.  He is on Lopressor  50 mg twice daily for rate control and Eliquis  5 mg twice daily for CVA prophylaxis.  Has not required any rhythm control or adjustment of his rate control agents. No bleeding on Eliquis .

## 2024-02-10 NOTE — Assessment & Plan Note (Signed)
 He has had basically the equivalent of Cath Lab CABG with RCA, LCx-OM 3 and LAD-D2 PCI. Was maintained on DAPT until started on Eliquis  for A-fib. On minimal medications with Lopressor  50 mL twice daily and atorvastatin  is the only other medication in conjunction with Eliquis .

## 2024-02-10 NOTE — Assessment & Plan Note (Addendum)
 Currently smoking half a pack per day, reduced from previous levels.  Did not tolerate Chantix, and did not like the taste of Nicorette gum. He has cut down dramatically but is still having a hard time making the final step.  Smoking cessation instruction/counseling given:  counseled patient on the dangers of tobacco use, advised patient to stop smoking, and reviewed strategies to maximize success

## 2024-02-10 NOTE — Assessment & Plan Note (Addendum)
 Over 4 years out now from multivessel PCI with more recent diagnosis of A-fib.  CHA2DS2-VASc score is 3 No longer on DOAC.  Is on Eliquis  5 mg twice daily. => Okay to hold Eliquis  for 48 to 72 hours preop for surgical procedure without bridging.  Can hold additional 1 day postop if necessary.

## 2024-02-10 NOTE — Assessment & Plan Note (Signed)
 No further evaluation since staged PCI.  EF was relatively preserved on Echocardiogram and he has not had any further angina or heart failure symptoms. Now on minimal medications: Not on aspirin  or Plavix  because of Eliquis .  (He has already had issues with bleeding in the past therefore would not want to overmedicate On stable dose of atorvastatin  with well-controlled lipids as of 2023. Only remaining blood pressure medication is Lopressor  50 mg twice daily. Continue current meds.  No change.

## 2024-02-10 NOTE — Assessment & Plan Note (Signed)
 Thankfully, due to early bystander PCI (firefighter monitoring him following his COVID-vaccine noted syncopal episode likely related to vasovagal syncope in the setting of dehydration after COVID shot.  CPR initiated with ROSC.  Found to have lateral ST elevations on EKG taken to the Cath Lab.  He did not recall ever having any angina, and the only post cath symptoms he noted was related to musculoskeletal chest pain from CPR. Now status post multivessel PCI with preserved EF and no recurrent angina or heart failure symptoms.

## 2024-02-10 NOTE — Assessment & Plan Note (Signed)
 On atorvastatin  40 mg daily.  Last cholesterol check unk that I have ordered was from back in June 2023. - Order FLP and LFTs

## 2024-03-12 ENCOUNTER — Other Ambulatory Visit: Payer: Self-pay | Admitting: Cardiology

## 2024-06-03 ENCOUNTER — Other Ambulatory Visit: Payer: Self-pay | Admitting: Cardiology

## 2024-07-14 ENCOUNTER — Other Ambulatory Visit: Payer: Self-pay

## 2024-07-14 DIAGNOSIS — I480 Paroxysmal atrial fibrillation: Secondary | ICD-10-CM
